# Patient Record
Sex: Male | Born: 1970 | Race: White | Hispanic: No | Marital: Married | Smoking: Never smoker
Health system: Southern US, Community
[De-identification: ages and names within clinical notes are randomized; demographics above are authoritative.]

## PROBLEM LIST (undated history)

## (undated) DIAGNOSIS — F419 Anxiety disorder, unspecified: Secondary | ICD-10-CM

## (undated) DIAGNOSIS — K219 Gastro-esophageal reflux disease without esophagitis: Secondary | ICD-10-CM

## (undated) DIAGNOSIS — J9601 Acute respiratory failure with hypoxia: Secondary | ICD-10-CM

## (undated) DIAGNOSIS — IMO0002 Reserved for concepts with insufficient information to code with codable children: Secondary | ICD-10-CM

## (undated) DIAGNOSIS — G4730 Sleep apnea, unspecified: Secondary | ICD-10-CM

## (undated) HISTORY — DX: Anxiety disorder, unspecified: F41.9

## (undated) HISTORY — PX: EYE SURGERY: SHX253

## (undated) HISTORY — PX: COLONOSCOPY: SHX174

## (undated) HISTORY — DX: Gastro-esophageal reflux disease without esophagitis: K21.9

## (undated) HISTORY — DX: Reserved for concepts with insufficient information to code with codable children: IMO0002

## (undated) HISTORY — PX: UPPER GI ENDOSCOPY: SHX6162

## (undated) HISTORY — DX: Sleep apnea, unspecified: G47.30

## (undated) HISTORY — DX: Acute respiratory failure with hypoxia: J96.01

---

## 2007-12-17 ENCOUNTER — Ambulatory Visit: Payer: Self-pay | Admitting: Internal Medicine

## 2008-03-03 ENCOUNTER — Ambulatory Visit: Payer: Self-pay | Admitting: Specialist

## 2008-03-23 ENCOUNTER — Ambulatory Visit: Payer: Self-pay | Admitting: Unknown Physician Specialty

## 2009-08-19 ENCOUNTER — Ambulatory Visit: Payer: Self-pay | Admitting: Family Medicine

## 2009-08-19 DIAGNOSIS — L255 Unspecified contact dermatitis due to plants, except food: Secondary | ICD-10-CM

## 2009-08-19 HISTORY — DX: Unspecified contact dermatitis due to plants, except food: L25.5

## 2010-03-28 NOTE — Assessment & Plan Note (Signed)
Summary: POISON OAK/EVM   Vital Signs:  Patient Profile:   40 Years Old Male CC:      Poison oak rash. Height:     67.75 inches Weight:      184 pounds BMI:     28.29 Temp:     97.6 degrees F oral Pulse rate:   80 / minute Pulse rhythm:   regular Resp:     18 per minute BP sitting:   98 / 60  (left arm)  Pt. in pain?   no  Vitals Entered By: Ma Hillock LPN                   Prior Medication List:  No prior medications documented  Current Allergies: No known allergies History of Present Illness Chief Complaint: Poison oak rash. History of Present Illness: Patient exposed to Poison Ivy yesterday. Hx of multiple exposures usuaally requiring an injection or pills when it happens. Started reacting to the exposure today.  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  Eyes       Denies change in vision, eye pain, and eye discharge. Ear/Nose/Throat/Mouth       Denies hearing loss/aids, change in hearing, and ear pain.  Respiratory       Denies dry cough, productive cough, wheezing, and shortness of breath.  Cardiovascular       Denies murmurs, chest pain, and tires easily with exhertion.    Gastrointestinal       Denies stomach pain, nausea/vomiting, and diarrhea. Genitourniary       Denies painful urination and blood or discharge from penis. Neurological       Denies headaches, loss of or changes in sensation, and numbness. Musculoskeletal       Complains of joint stiffness.      Denies muscle pain.   Psych       Denies mood changes, anxiety/stress, and speech problems.  Past History:  Family History: Family History of CAD  Male 1st degree relative <60 Family History Hypertension Family History of Stroke M 1st degree relative <50 Family History of Cardiovascular disorder  Vital Signs:  Patient profile:   40 Years Old Male Height:      67.75 inches Weight:      184 pounds BMI:     28.29 Temp:     97.6 degrees F oral Pulse rate:   80 / minute Pulse rhythm:   regular Resp:     18 per minute BP sitting:   98 / 60  CC: Poison oak rash.  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  Plan New Medications/Changes: PREDNISONE (PAK) 10 MG TABS (PREDNISONE) sig 6 pills 1st 2 days,5 pills days 3-4, 4 pills 5-6, 3 pills day 7-8, 2 pills day 9-10,1 pills 11-12  #  QS x 0, 08/19/2009, Hassan Rowan MD  New Orders: New Patient Level III [phone] Solumedrol up to 125mg  [J2930] Planning Comments:   take all of the pills to acvoid rebound  Follow Up: Follow up on an as needed basis, Follow up with Primary Physician   Diagnoses and expected course of recovery discussed and will return if not improved as expected or if the condition worsens. Patient and/or caregiver verbalized understanding.  Prescriptions: PREDNISONE (PAK) 10 MG TABS (PREDNISONE) sig 6 pills 1st 2 days,5 pills days 3-4, 4 pills 5-6, 3 pills day 7-8, 2 pills day 9-10,1 pills 11-12  #QS x 0   Entered and Authorized by:   Hassan Rowan MD   Signed by:   Hassan Rowan MD on 08/19/2009   Method used:   Print then Give to Patient   RxID:   [id number]   Patient Instructions: 1)  Please schedule an appointment with your  primary doctor in : 2)   a follow-up appointment in 2 weeks if needed. 3)  Please take all of your pills to prevent rebound.  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 given injection of Solumedrol 125mg  per MD order without complications.  Observed for 10 minutes after injection was given tolerated well.    Patient tolerated injection without complications    Given by: Ashok Norris LPN (August 19, 2009 1:35 PM)  Orders Added: 1)  New Patient Level III [99203] 2)  Solumedrol up to 125mg  [J2930]

## 2012-07-09 ENCOUNTER — Ambulatory Visit: Payer: Self-pay | Admitting: Gastroenterology

## 2012-07-12 ENCOUNTER — Ambulatory Visit: Payer: Self-pay | Admitting: Gastroenterology

## 2012-09-21 ENCOUNTER — Encounter: Payer: Self-pay | Admitting: Internal Medicine

## 2012-09-21 ENCOUNTER — Ambulatory Visit (INDEPENDENT_AMBULATORY_CARE_PROVIDER_SITE_OTHER): Payer: 59 | Admitting: Internal Medicine

## 2012-09-21 VITALS — BP 120/80 | HR 82 | Temp 98.1°F | Ht 67.5 in | Wt 195.0 lb

## 2012-09-21 DIAGNOSIS — IMO0002 Reserved for concepts with insufficient information to code with codable children: Secondary | ICD-10-CM

## 2012-09-21 DIAGNOSIS — R42 Dizziness and giddiness: Secondary | ICD-10-CM

## 2012-09-21 DIAGNOSIS — K219 Gastro-esophageal reflux disease without esophagitis: Secondary | ICD-10-CM

## 2012-09-21 DIAGNOSIS — Z125 Encounter for screening for malignant neoplasm of prostate: Secondary | ICD-10-CM

## 2012-09-21 DIAGNOSIS — R109 Unspecified abdominal pain: Secondary | ICD-10-CM

## 2012-09-21 DIAGNOSIS — Z13220 Encounter for screening for lipoid disorders: Secondary | ICD-10-CM

## 2012-09-25 ENCOUNTER — Encounter: Payer: Self-pay | Admitting: Internal Medicine

## 2012-09-25 DIAGNOSIS — M5090 Cervical disc disorder, unspecified, unspecified cervical region: Secondary | ICD-10-CM | POA: Insufficient documentation

## 2012-09-25 DIAGNOSIS — K219 Gastro-esophageal reflux disease without esophagitis: Secondary | ICD-10-CM | POA: Insufficient documentation

## 2012-09-25 DIAGNOSIS — R42 Dizziness and giddiness: Secondary | ICD-10-CM | POA: Insufficient documentation

## 2012-09-25 NOTE — Progress Notes (Signed)
  Subjective:    Patient ID: Devin Humphrey, male    DOB: 04-22-1970, 42 y.o.   MRN: [id number]  HPI 42 year old male with past history of cervical disc disease and possible GERD.  He reports that he was having persistent intermittent cramping and abdominal discomfort.  Saw GI [facility] [place]  Had an EGD, colonoscopy, abdominal ultrasound and HIDA.  States all tests turned out "fine".  Was started on PPI.  States symptoms have resolved since he has been taking it on a regular basis.  Feels back to normal.  Eating and drinking well.  Does have neck pain.  Has been evaluated.  Found to have C3-4 DDD.  Has had epidural injections.  No significant change.  Does describe intermittent neck pain and burning.  He also describes a sensation change in his head.  No actual dizziness.  Hard for him to describe.  This has been going on for a while.  Has had MRI.  Has seen ENT and neurology.  No known cause found.  Increased stress.  Better now.  Describes the sensation change as only occurring when he drives on the highway.  Does not occur when he is driving any other places.  Does not occur at other times.  No associated headache or vision change.     Past Medical History  Diagnosis Date  . GERD (gastroesophageal reflux disease)   . Degenerative disc disease     cervical disc disease    Outpatient Encounter Prescriptions as of 09/21/2012  Medication Sig Dispense Refill  . Multiple Vitamins-Minerals (MENS MULTIVITAMIN PLUS PO) Take by mouth.      Marland Kitchen omeprazole-sodium bicarbonate (ZEGERID) 40-1100 MG per capsule Take 1 capsule by mouth daily before breakfast.       No facility-administered encounter medications on file as of 09/21/2012.    Review of Systems Patient denies any headache.  No significant dizziness.  Does report the head sensation change as outlined.  No chest pain, tightness or palpitations.  No increased shortness of breath, cough or congestion.  No nausea or vomiting.  No abdominal pain or  cramping now.  On PPI.  Symptoms better now.  No bowel change, such as diarrhea, constipation, BRBPR or melana.  No urine change.        Objective:   Physical Exam Filed Vitals:   09/21/12 0946  BP: 120/80  Pulse: 82  Temp: 98.1 F (51.47 C)   42 year old male in no acute distress.  HEENT:  Nares - clear.  Oropharynx - without lesions. NECK:  Supple.  Nontender.  No audible carotid bruit.  HEART:  Appears to be regular.   LUNGS:  No crackles or wheezing audible.  Respirations even and unlabored.   RADIAL PULSE:  Equal bilaterally.  ABDOMEN:  Soft.  Nontender.  Bowel sounds present and normal.  No audible abdominal bruit.   EXTREMITIES:  No increased edema present.  DP pulses palpable and equal bilaterally.      Assessment & Plan:  HEALTH MAINTENANCE.  Obtain outside records.  Schedule him for a physical next visit.  Check routine labs including cholesterol.   I spent 45 minutes with this patient and more than 50% of the time was spent in consultation regarding the above.

## 2012-09-25 NOTE — Assessment & Plan Note (Signed)
Sensation change as outlined.  Only occurs when driving on the highway.  Discussed at length with him today.  Has had extensive w/up including MRI, ENT and neurology evaluation.  Discussed the possibility of anxiety contributing.  Discussed medications.  Will follow.  Check routine labs.   Schedule f/u soon to reassess.

## 2012-09-25 NOTE — Assessment & Plan Note (Signed)
Has had extensive GI w/up as outlined.  Since being on PPI, symptoms better.  Follow.   

## 2012-09-25 NOTE — Assessment & Plan Note (Signed)
Involves C3-4.  Has been evaluated.  Has had epidural injections.  Stable.   

## 2012-10-04 ENCOUNTER — Encounter: Payer: Self-pay | Admitting: Internal Medicine

## 2012-11-12 ENCOUNTER — Other Ambulatory Visit: Payer: Self-pay | Admitting: *Deleted

## 2012-11-12 MED ORDER — OMEPRAZOLE-SODIUM BICARBONATE 40-1100 MG PO CAPS: 1.0000 | ORAL_CAPSULE | Freq: Every day | ORAL | Status: DC

## 2012-11-12 NOTE — Telephone Encounter (Signed)
Requested RF on Zegerid #[id number] to [facility] sent electronically

## 2012-11-30 ENCOUNTER — Other Ambulatory Visit: Payer: 59

## 2012-12-03 ENCOUNTER — Encounter: Payer: 59 | Admitting: Internal Medicine

## 2013-06-13 ENCOUNTER — Encounter: Payer: Self-pay | Admitting: Adult Health

## 2013-06-13 ENCOUNTER — Ambulatory Visit (INDEPENDENT_AMBULATORY_CARE_PROVIDER_SITE_OTHER): Payer: 59 | Admitting: Adult Health

## 2013-06-13 VITALS — BP 112/62 | HR 80 | Temp 97.7°F | Resp 14 | Wt 197.0 lb

## 2013-06-13 DIAGNOSIS — J069 Acute upper respiratory infection, unspecified: Secondary | ICD-10-CM

## 2013-06-13 MED ORDER — BENZONATATE 200 MG PO CAPS: 200.0000 mg | ORAL_CAPSULE | Freq: Two times a day (BID) | ORAL | Status: DC | PRN

## 2013-06-13 NOTE — Progress Notes (Signed)
Pre visit review using our clinic review tool, if applicable. No additional management support is needed unless otherwise documented below in the visit note. 

## 2013-06-13 NOTE — Patient Instructions (Signed)
   Start Tessalon 1 capsule twice daily for cough. Continue your prescribed nasal spray You may consider taking an over the counter antihistamine such as claritin, allegra or zyrtec.  ADDITIONAL TREATMENT RECOMMENDATIONS:  1. Decongestant: Sudafed (NOT IF HIGH BLOOD PRESSURE)  2. Nose Sprays: Saline nasal spray, Can use Afrin or Neosynephrine, but no more than 3 days  3. Mucinex (expectorant) or Mucinex-D (expectorant and decongestant)  4. Breathe moist air: Humidifier, Vaporizer, or steam from shower   Call if symptoms worsen or do not improve within 4-5 days.

## 2013-06-13 NOTE — Progress Notes (Signed)
   Subjective:    Patient ID: Devin Humphrey, male    DOB: Dec 06, 1970, 43 y.o.   MRN: [id number]  HPI  Pt is a 43 y/o male who presents to clinic with c/o cough, ear fullness, headache, nasal congestion. He reports being seen in the urgent care on 05/28/13 and he was given a prednisone taper. These symptoms started back on Thursday. No fever or chills. Has taken sudafed and NyQuil.    Past Medical History  Diagnosis Date  . GERD (gastroesophageal reflux disease)   . Degenerative disc disease     cervical disc disease     No past surgical history on file.   Family History  Problem Relation Age of Onset  . Hyperlipidemia Father   . Heart disease Father     s/p CABG and stent  . Stroke Father   . Hypertension Father   . Colon cancer Neg Hx   . Prostate cancer Neg Hx      History   Social History  . Marital Status: Married    Spouse Name: N/A    Number of Children: N/A  . Years of Education: N/A   Occupational History  . Not on file.   Social History Main Topics  . Smoking status: Never Smoker   . Smokeless tobacco: Never Used  . Alcohol Use: Yes  . Drug Use: No  . Sexual Activity: Not on file   Other Topics Concern  . Not on file   Social History Narrative  . No narrative on file    Review of Systems  Constitutional: Negative for fever and chills.  HENT: Positive for congestion, postnasal drip, rhinorrhea, sinus pressure and sore throat.   Respiratory: Positive for cough. Negative for shortness of breath and wheezing.   Gastrointestinal: Negative.   Neurological: Positive for headaches.  All other systems reviewed and are negative.      Objective:   Physical Exam  Constitutional: He is oriented to person, place, and time. He appears well-developed and well-nourished. No distress.  HENT:  Head: Normocephalic and atraumatic.  Right Ear: External ear normal.  Left Ear: External ear normal.  Mouth/Throat: Oropharynx is clear and moist. No  oropharyngeal exudate.  Eyes: Conjunctivae and EOM are normal.  Neck: Normal range of motion. Neck supple.  Cardiovascular: Normal rate, regular rhythm and normal heart sounds.  Exam reveals no gallop and no friction rub.   No murmur heard. Pulmonary/Chest: Effort normal and breath sounds normal. No respiratory distress. He has no wheezes. He has no rales.  Musculoskeletal: Normal range of motion.  Lymphadenopathy:    He has no cervical adenopathy.  Neurological: He is alert and oriented to person, place, and time.  Skin: Skin is warm and dry.  Psychiatric: He has a normal mood and affect. His behavior is normal. Judgment and thought content normal.      Assessment & Plan:   1. Upper respiratory infection Start tessalon for cough. Continue otc medication: sudafed. May consider antihistamine such as allegra, claritin or zyrtec. Saline nasal spray liberally. Afrin for severe congestion x 3 days only. If symptoms persist or do not improve within 4-5 days he is to call the office.

## 2013-08-24 ENCOUNTER — Ambulatory Visit (INDEPENDENT_AMBULATORY_CARE_PROVIDER_SITE_OTHER): Payer: No Typology Code available for payment source | Admitting: Internal Medicine

## 2013-08-24 ENCOUNTER — Encounter: Payer: Self-pay | Admitting: Internal Medicine

## 2013-08-24 VITALS — BP 120/80 | HR 81 | Temp 98.1°F | Ht 68.0 in | Wt 207.0 lb

## 2013-08-24 DIAGNOSIS — K219 Gastro-esophageal reflux disease without esophagitis: Secondary | ICD-10-CM

## 2013-08-24 DIAGNOSIS — M5030 Other cervical disc degeneration, unspecified cervical region: Secondary | ICD-10-CM

## 2013-08-24 DIAGNOSIS — Z125 Encounter for screening for malignant neoplasm of prostate: Secondary | ICD-10-CM

## 2013-08-24 DIAGNOSIS — R109 Unspecified abdominal pain: Secondary | ICD-10-CM

## 2013-08-24 DIAGNOSIS — R42 Dizziness and giddiness: Secondary | ICD-10-CM

## 2013-08-24 DIAGNOSIS — Z13220 Encounter for screening for lipoid disorders: Secondary | ICD-10-CM

## 2013-08-24 DIAGNOSIS — R0602 Shortness of breath: Secondary | ICD-10-CM

## 2013-08-24 DIAGNOSIS — R079 Chest pain, unspecified: Secondary | ICD-10-CM

## 2013-08-24 LAB — LIPID PANEL
CHOL/HDL RATIO: 6
Cholesterol: 228 mg/dL — ABNORMAL HIGH (ref 0–200)
HDL: 35.1 mg/dL — AB (ref >39.00)
LDL Cholesterol: 135 mg/dL — ABNORMAL HIGH (ref 0–99)
NonHDL: 192.9
Triglycerides: 292 mg/dL — ABNORMAL HIGH (ref 0.0–149.0)
VLDL: 58.4 mg/dL — ABNORMAL HIGH (ref 0.0–40.0)

## 2013-08-24 LAB — CBC WITH DIFFERENTIAL/PLATELET
BASOS ABS: 0 10*3/uL (ref 0.0–0.1)
Basophils Relative: 0.4 % (ref 0.0–3.0)
Eosinophils Absolute: 0.1 10*3/uL (ref 0.0–0.7)
Eosinophils Relative: 1.5 % (ref 0.0–5.0)
HEMATOCRIT: 44.5 % (ref 39.0–52.0)
HEMOGLOBIN: 15.4 g/dL (ref 13.0–17.0)
LYMPHS ABS: 2.7 10*3/uL (ref 0.7–4.0)
LYMPHS PCT: 40.7 % (ref 12.0–46.0)
MCHC: 34.8 g/dL (ref 30.0–36.0)
MCV: 85.2 fl (ref 78.0–100.0)
MONOS PCT: 7.1 % (ref 3.0–12.0)
Monocytes Absolute: 0.5 10*3/uL (ref 0.1–1.0)
Neutro Abs: 3.3 10*3/uL (ref 1.4–7.7)
Neutrophils Relative %: 50.3 % (ref 43.0–77.0)
Platelets: 297 10*3/uL (ref 150.0–400.0)
RBC: 5.22 Mil/uL (ref 4.22–5.81)
RDW: 14 % (ref 11.5–15.5)
WBC: 6.6 10*3/uL (ref 4.0–10.5)

## 2013-08-24 LAB — COMPREHENSIVE METABOLIC PANEL
ALT: 23 U/L (ref 0–53)
AST: 25 U/L (ref 0–37)
Albumin: 4.2 g/dL (ref 3.5–5.2)
Alkaline Phosphatase: 72 U/L (ref 39–117)
BILIRUBIN TOTAL: 0.5 mg/dL (ref 0.2–1.2)
BUN: 16 mg/dL (ref 6–23)
CO2: 23 meq/L (ref 19–32)
CREATININE: 1 mg/dL (ref 0.4–1.5)
Calcium: 9.2 mg/dL (ref 8.4–10.5)
Chloride: 103 mEq/L (ref 96–112)
GFR: 84.31 mL/min (ref >60.00)
Glucose, Bld: 87 mg/dL (ref 70–99)
Potassium: 4.3 mEq/L (ref 3.5–5.1)
Sodium: 138 mEq/L (ref 135–145)
Total Protein: 7.1 g/dL (ref 6.0–8.3)

## 2013-08-24 LAB — TSH: TSH: 1.24 u[IU]/mL (ref 0.35–4.50)

## 2013-08-24 LAB — PSA: PSA: 0.75 ng/mL (ref 0.10–4.00)

## 2013-08-24 MED ORDER — SERTRALINE HCL 50 MG PO TABS: 50.0000 mg | ORAL_TABLET | Freq: Every day | ORAL | Status: DC

## 2013-08-24 NOTE — Progress Notes (Signed)
Pre visit review using our clinic review tool, if applicable. No additional management support is needed unless otherwise documented below in the visit note. 

## 2013-08-24 NOTE — Patient Instructions (Signed)
Take 1/2 tablet per day for one week and then increase to one whole tablet per day.

## 2013-08-25 ENCOUNTER — Encounter: Payer: Self-pay | Admitting: *Deleted

## 2013-08-27 ENCOUNTER — Encounter: Payer: Self-pay | Admitting: Internal Medicine

## 2013-08-27 DIAGNOSIS — R0602 Shortness of breath: Secondary | ICD-10-CM | POA: Insufficient documentation

## 2013-08-27 NOTE — Assessment & Plan Note (Signed)
EKG obtained and revealed SR with flattening Twaves in III.  Father had first MI in his 6240s.  Given the sob with exertion and family history, I will order stress test to confirm no active ischemia.

## 2013-08-27 NOTE — Assessment & Plan Note (Signed)
Has had extensive GI w/up as outlined.  Since being on PPI, symptoms better.  Follow.

## 2013-08-27 NOTE — Assessment & Plan Note (Signed)
Involves C3-4.  Has been evaluated.  Has had epidural injections.  Stable.

## 2013-08-27 NOTE — Progress Notes (Signed)
Subjective:    Patient ID: Devin Humphrey, male    DOB: 1970-10-23, 43 y.o.   MRN: [id number]  HPI 43 year old male with past history of cervical disc disease and possible GERD.  He reports that he was having persistent intermittent cramping and abdominal discomfort.  Saw [facility]  Had an EGD, colonoscopy, abdominal ultrasound and HIDA.  States all tests turned out "fine".  Was started on PPI.  States symptoms have resolved since he has been taking it on a regular basis.  He comes in today to follow up on these issues as well as for a complete physical exam.   Eating and drinking well.  No abdominal pain or cramping.  No nausea or vomiting.  Does have neck pain.  Has been evaluated.  Found to have C3-4 DDD.  Has had epidural injections.  No significant change.  Does describe intermittent neck pain and burning.  He also describes a sensation change in his head.  No actual dizziness.  Hard for him to describe.  This has been going on for a while.  Has had MRI.  Has seen ENT and neurology.  No known cause found.  Increased stress.  Better now.  Describes the sensation change as only occurring when he drives on the highway.  Does not occur when he is driving any other places.  Does not occur at other times.  No associated headache or vision change.  He does feel this is getting worse.  No chest pain or tightness.  Breathing overall relatively stable.  Some sob with exertion.    Past Medical History  Diagnosis Date  . GERD (gastroesophageal reflux disease)   . Degenerative disc disease     cervical disc disease    Outpatient Encounter Prescriptions as of 08/24/2013  Medication Sig  . sertraline (ZOLOFT) 50 MG tablet Take 1 tablet (50 mg total) by mouth daily.  . [DISCONTINUED] benzonatate (TESSALON) 200 MG capsule Take 1 capsule (200 mg total) by mouth 2 (two) times daily as needed for cough.  . [DISCONTINUED] fluticasone (FLONASE) 50 MCG/ACT nasal spray   . [DISCONTINUED] Multiple  Vitamins-Minerals (MENS MULTIVITAMIN PLUS PO) Take by mouth.    Review of Systems Patient denies any headache.  No significant dizziness.  Does report the head sensation change as outlined.  No chest pain, tightness or palpitations.  Some shortness of breath with exertion.  No cough or congestion.  No nausea or vomiting.  No abdominal pain or cramping now.  On PPI.  Symptoms better now.  No bowel change, such as diarrhea, constipation, BRBPR or melana.  No urine change.  Increased anxiety.  Feels needs to be on something to help control the anxiety.  No depression.        Objective:   Physical Exam  Filed Vitals:   08/24/13 0852  BP: 120/80  Pulse: 81  Temp: 98.1 F (36.7 C)   Blood pressure recheck:  118/78, pulse 6860  43 year old male in no acute distress.  HEENT:  Nares - clear.  Oropharynx - without lesions. NECK:  Supple.  Nontender.  No audible carotid bruit.  HEART:  Appears to be regular.   LUNGS:  No crackles or wheezing audible.  Respirations even and unlabored.   RADIAL PULSE:  Equal bilaterally.  ABDOMEN:  Soft.  Nontender.  Bowel sounds present and normal.  No audible abdominal bruit.  GU:  Normal descended testicles.  No palpable testicular nodules.   RECTAL:  Could  not appreciate any palpable prostate nodules.  Heme negative.   EXTREMITIES:  No increased edema present.  DP pulses palpable and equal bilaterally.         Assessment & Plan:  HEALTH MAINTENANCE.   Physical today.  Check routine labs including PSA.    I spent 25 minutes with this patient and more than 50% of the time was spent in consultation regarding the above.

## 2013-08-27 NOTE — Assessment & Plan Note (Signed)
Sensation change as outlined.  Only occurs when driving on the highway.  Discussed at length with him today.  Has had extensive w/up including MRI, ENT and neurology evaluation.  Discussed the possibility of anxiety contributing.  Discussed medications.  Will start zoloft as directed.  Check routine labs.   Schedule f/u soon to reassess.

## 2013-09-22 ENCOUNTER — Other Ambulatory Visit (INDEPENDENT_AMBULATORY_CARE_PROVIDER_SITE_OTHER): Payer: No Typology Code available for payment source

## 2013-09-22 DIAGNOSIS — R0602 Shortness of breath: Secondary | ICD-10-CM

## 2013-10-05 ENCOUNTER — Encounter: Payer: Self-pay | Admitting: Internal Medicine

## 2013-10-05 ENCOUNTER — Ambulatory Visit (INDEPENDENT_AMBULATORY_CARE_PROVIDER_SITE_OTHER): Payer: No Typology Code available for payment source | Admitting: Internal Medicine

## 2013-10-05 VITALS — BP 130/80 | HR 91 | Temp 97.9°F | Ht 68.0 in | Wt 205.5 lb

## 2013-10-05 DIAGNOSIS — K219 Gastro-esophageal reflux disease without esophagitis: Secondary | ICD-10-CM

## 2013-10-05 DIAGNOSIS — F411 Generalized anxiety disorder: Secondary | ICD-10-CM

## 2013-10-05 DIAGNOSIS — F419 Anxiety disorder, unspecified: Secondary | ICD-10-CM

## 2013-10-05 DIAGNOSIS — R0602 Shortness of breath: Secondary | ICD-10-CM

## 2013-10-05 NOTE — Progress Notes (Signed)
Pre visit review using our clinic review tool, if applicable. No additional management support is needed unless otherwise documented below in the visit note. 

## 2013-10-09 ENCOUNTER — Encounter: Payer: Self-pay | Admitting: Internal Medicine

## 2013-10-09 DIAGNOSIS — F419 Anxiety disorder, unspecified: Secondary | ICD-10-CM | POA: Insufficient documentation

## 2013-10-09 NOTE — Progress Notes (Signed)
  Subjective:    Patient ID: Devin Humphrey, male    DOB: Jul 01, 1970, 43 y.o.   MRN: [id number]  HPI 43 year old male with past history of cervical disc disease and possible GERD.  He reports that he was having persistent intermittent cramping and abdominal discomfort.  [facility]  Had an EGD, colonoscopy, abdominal ultrasound and HIDA.  States all tests turned out "fine".  Was started on PPI.  States symptoms have resolved since he has been taking it on a regular basis.  He comes in today for a scheduled follow up.   Eating and drinking well.  No abdominal pain or cramping.  No nausea or vomiting.  Does have neck pain.  Has been evaluated.  Found to have C3-4 DDD.  Has had epidural injections.  No significant change.  Does describe intermittent neck pain and burning.  He also describes a sensation change in his head.  No actual dizziness.  This has been going on for a while.  Has had MRI.  Has seen ENT and neurology.  No known cause found. Reported increased stress previously.  See last note for details.  Was started on zoloft.  Has helped.  States he does not feel as stressed and does not worry as much.  He feels he is more fatigued since starting.  No chest pain or tightness.  No sob.  Eating and drinking well.     Past Medical History  Diagnosis Date  . GERD (gastroesophageal reflux disease)   . Degenerative disc disease     cervical disc disease    Outpatient Encounter Prescriptions as of 10/05/2013  Medication Sig  . sertraline (ZOLOFT) 50 MG tablet Take 1 tablet (50 mg total) by mouth daily.    Review of Systems Patient denies any headache.  No significant dizziness.  No  Sinus or allergy symptoms.  No chest pain, tightness or palpitations.  No sob.  No cough or congestion.  No nausea or vomiting.  No abdominal pain or cramping now.  On PPI.  Symptoms better now.  No bowel change, such as diarrhea, constipation, BRBPR or melana.  No urine change.  Anxiety better on zoloft.   Some fatigue that started when he started on zoloft.  No depression.        Objective:   Physical Exam  Filed Vitals:   10/05/13 1237  BP: 130/80  Pulse: 91  Temp: 97.9 F (36.6 C)   Blood pressure recheck:  45110/6178  43 year old male in no acute distress.  HEENT:  Nares - clear.  Oropharynx - without lesions. NECK:  Supple.  Nontender.  No audible carotid bruit.  HEART:  Appears to be regular.   LUNGS:  No crackles or wheezing audible.  Respirations even and unlabored.   RADIAL PULSE:  Equal bilaterally.  ABDOMEN:  Soft.  Nontender.  Bowel sounds present and normal.  No audible abdominal bruit.    EXTREMITIES:  No increased edema present.  DP pulses palpable and equal bilaterally.         Assessment & Plan:  HEALTH MAINTENANCE.   Physical 08/24/13.  PSA .75 08/24/13.

## 2013-10-09 NOTE — Assessment & Plan Note (Signed)
Recent stress test negative for ischemia.  Breathing stable.  Follow.

## 2013-10-09 NOTE — Assessment & Plan Note (Signed)
Better since starting zoloft.  Some fatigue since starting.  Since zoloft working well, will decrease dose to 25mg  q day and see if he tolerates better.  Follow.

## 2013-10-09 NOTE — Assessment & Plan Note (Signed)
Has had extensive GI w/up as outlined.  Since being on PPI, symptoms better.  Follow.   

## 2013-10-13 ENCOUNTER — Other Ambulatory Visit: Payer: Self-pay | Admitting: *Deleted

## 2013-10-13 MED ORDER — SERTRALINE HCL 50 MG PO TABS: 50.0000 mg | ORAL_TABLET | Freq: Every day | ORAL | Status: DC

## 2013-10-27 ENCOUNTER — Other Ambulatory Visit: Payer: Self-pay | Admitting: Internal Medicine

## 2013-12-15 ENCOUNTER — Encounter: Payer: Self-pay | Admitting: Internal Medicine

## 2013-12-15 ENCOUNTER — Encounter (INDEPENDENT_AMBULATORY_CARE_PROVIDER_SITE_OTHER): Payer: Self-pay

## 2013-12-15 ENCOUNTER — Ambulatory Visit (INDEPENDENT_AMBULATORY_CARE_PROVIDER_SITE_OTHER): Payer: No Typology Code available for payment source | Admitting: Internal Medicine

## 2013-12-15 VITALS — BP 120/80 | HR 97 | Temp 98.2°F | Ht 68.0 in | Wt 205.2 lb

## 2013-12-15 DIAGNOSIS — E7800 Pure hypercholesterolemia, unspecified: Secondary | ICD-10-CM

## 2013-12-15 DIAGNOSIS — M5090 Cervical disc disorder, unspecified, unspecified cervical region: Secondary | ICD-10-CM

## 2013-12-15 DIAGNOSIS — F419 Anxiety disorder, unspecified: Secondary | ICD-10-CM

## 2013-12-15 NOTE — Progress Notes (Signed)
Pre visit review using our clinic review tool, if applicable. No additional management support is needed unless otherwise documented below in the visit note. 

## 2013-12-19 ENCOUNTER — Encounter: Payer: Self-pay | Admitting: Internal Medicine

## 2013-12-19 DIAGNOSIS — E7800 Pure hypercholesterolemia, unspecified: Secondary | ICD-10-CM | POA: Insufficient documentation

## 2013-12-19 NOTE — Assessment & Plan Note (Signed)
Involves C3-4.  Has been evaluated.  Has had epidural injections.  Stable.   

## 2013-12-19 NOTE — Assessment & Plan Note (Signed)
On zoloft and doing better.  Still some issues with driving as outlined.  Refer to counselor for further evaluation and treatment.

## 2013-12-19 NOTE — Assessment & Plan Note (Signed)
Low cholesterol diet and exercise.  Follow.   

## 2013-12-19 NOTE — Progress Notes (Signed)
Subjective:    Patient ID: Devin Humphrey, male    DOB: Nov 24, 1970, 43 y.o.   MRN: [id number]  HPI 43 year old male with past history of cervical disc disease and possible GERD.  He reports that he was having persistent intermittent cramping and abdominal discomfort.  Saw GI [facility]  Had an EGD, colonoscopy, abdominal ultrasound and HIDA.  States all tests turned out "fine".  Was started on PPI.  States symptoms have resolved since he has been taking it on a regular basis.  He comes in today for a scheduled follow up.   Eating and drinking well.  No abdominal pain or cramping.  No nausea or vomiting.  Does have neck pain.  Has been evaluated.  Found to have C3-4 DDD.  Has had epidural injections.  No significant change.  Does describe intermittent neck pain and burning.  He also describes a sensation change in his head.  No actual dizziness.  This has been going on for a while.  Has had MRI.  Has seen ENT and neurology.  No known cause found.  Reported increased stress previously.  See previous note for details.  Was started on zoloft.  Has helped.  We had to adjust the dose down last visit.  He is taking 1/2 50mg  now and this appears to be working. States he does not feel as stressed and does not worry as much.   No chest pain or tightness.  No sob.  Eating and drinking well. Some fatigue.  He still has issues [place].  We discussed this more today.  Discussed referral for counseling.  He is in agreement.      Past Medical History  Diagnosis Date  . GERD (gastroesophageal reflux disease)   . Degenerative disc disease     cervical disc disease    Outpatient Encounter Prescriptions as of 12/15/2013  Medication Sig  . sertraline (ZOLOFT) 50 MG tablet TAKE 1 TABLET BY MOUTH EVERY DAY  . [DISCONTINUED] sertraline (ZOLOFT) 50 MG tablet Take 1 tablet (50 mg total) by mouth daily.    Review of Systems Patient denies any headache.  No significant dizziness.  No   sinus or allergy symptoms.  No chest pain, tightness or palpitations.  No sob.  No cough or congestion.  No nausea or vomiting.  No abdominal pain or cramping now.  On PPI.  Symptoms better now.  No bowel change, such as diarrhea, constipation, BRBPR or melana.  No urine change.  Anxiety better on zoloft.  No depression.  Some fatigue.  Still has the issues with driving as outlined.         Objective:   Physical Exam  Filed Vitals:   12/15/13 1324  BP: 120/80  Pulse: 97  Temp: 98.2 F (4636.518 C)   43 year old male in no acute distress.  HEENT:  Nares - clear.  Oropharynx - without lesions. NECK:  Supple.  Nontender.  No audible carotid bruit.  HEART:  Appears to be regular.   LUNGS:  No crackles or wheezing audible.  Respirations even and unlabored.   RADIAL PULSE:  Equal bilaterally.  ABDOMEN:  Soft.  Nontender.  Bowel sounds present and normal.  No audible abdominal bruit.    EXTREMITIES:  No increased edema present.  DP pulses palpable and equal bilaterally.         Assessment & Plan:  HEALTH MAINTENANCE.   Physical 08/24/13.  PSA .75 08/24/13.    Problem  List Items Addressed This Visit   Anxiety - Primary     On zoloft and doing better.  Still some issues with driving as outlined.  Refer to counselor for further evaluation and treatment.       Cervical disc disease     Involves C3-4.  Has been evaluated.  Has had epidural injections.  Stable.

## 2014-01-17 ENCOUNTER — Encounter: Payer: Self-pay | Admitting: *Deleted

## 2014-01-17 ENCOUNTER — Other Ambulatory Visit (INDEPENDENT_AMBULATORY_CARE_PROVIDER_SITE_OTHER): Payer: No Typology Code available for payment source

## 2014-01-17 DIAGNOSIS — E7800 Pure hypercholesterolemia, unspecified: Secondary | ICD-10-CM

## 2014-01-17 LAB — COMPREHENSIVE METABOLIC PANEL
ALK PHOS: 83 U/L (ref 39–117)
ALT: 18 U/L (ref 0–53)
AST: 20 U/L (ref 0–37)
Albumin: 4.4 g/dL (ref 3.5–5.2)
BILIRUBIN TOTAL: 0.5 mg/dL (ref 0.2–1.2)
BUN: 19 mg/dL (ref 6–23)
CO2: 27 mEq/L (ref 19–32)
Calcium: 9.3 mg/dL (ref 8.4–10.5)
Chloride: 103 mEq/L (ref 96–112)
Creatinine, Ser: 1.1 mg/dL (ref 0.4–1.5)
GFR: 80.52 mL/min (ref >60.00)
Glucose, Bld: 88 mg/dL (ref 70–99)
Potassium: 4.5 mEq/L (ref 3.5–5.1)
Sodium: 138 mEq/L (ref 135–145)
Total Protein: 7.7 g/dL (ref 6.0–8.3)

## 2014-01-17 LAB — LIPID PANEL
Cholesterol: 191 mg/dL (ref 0–200)
HDL: 27.9 mg/dL — ABNORMAL LOW (ref >39.00)
LDL CALC: 134 mg/dL — AB (ref 0–99)
NONHDL: 163.1
Total CHOL/HDL Ratio: 7
Triglycerides: 146 mg/dL (ref 0.0–149.0)
VLDL: 29.2 mg/dL (ref 0.0–40.0)

## 2014-02-27 ENCOUNTER — Ambulatory Visit (INDEPENDENT_AMBULATORY_CARE_PROVIDER_SITE_OTHER): Payer: 59 | Admitting: General Surgery

## 2014-02-27 ENCOUNTER — Encounter: Payer: Self-pay | Admitting: General Surgery

## 2014-02-27 VITALS — BP 110/76 | HR 74 | Resp 12 | Ht 69.0 in | Wt 203.0 lb

## 2014-02-27 DIAGNOSIS — R109 Unspecified abdominal pain: Secondary | ICD-10-CM | POA: Insufficient documentation

## 2014-02-27 DIAGNOSIS — R079 Chest pain, unspecified: Secondary | ICD-10-CM

## 2014-02-27 NOTE — Progress Notes (Addendum)
Patient ID: Devin Humphrey, male   DOB: [birth year], 44 y.o.   MRN: [id number]  Chief Complaint  Patient presents with  . Other    evaluation of hernia    HPI Devin Humphrey is a 44 y.o. male who presents for an evaluation of an abdominal hernia. He states he has had abdominal pain for 4-5 years but has noticed it more so in the last month or two. The pain is located in the upper central portion of his abdomen just beneath his sternum. He has difficulty when eating. He states he feels like his food gets does not pass easily. He denies food "sticking". When eating he describes it as a pressure sensation.  Separate from this, he reports pain when lifting and bending. it does not need to be a heavy weight that he is lifting, as little as 20 or 25 pounds can precipitate symptoms.  He describes the pain when lifting as a burning and stabbing pain that brings him to his knees. The sharp stabbing pain comes and goes and the pain that is associated with eating is a constant sensation. He has been treated for acid reflux  with a PPI, but this didn't imhis symptoms and he discontinued the medication.  The patient denies any weight loss during this time or change in exercise tolerance. He does reported present he weighs more than that any time in the past.  He denies any component of back pain with the above-mentioned discomfort with swallowing and while lifting.  The patient was accompanied today by his wife, Misty Stanley, who was present for the interview and exam.  He was evaluated in 2014 by the GI service. That assessment included an abdominal ultrasound followed by a HIDA scan in May 2014. Both studies were normal.  He underwent upper and lower endoscopy in June 2014. Faxed copies of the report were reviewed. The colon was entirely normal. A single polyp was reported in the gastric body. The results of that pathology is not available. Of note there was no evidence of esophagitis recorded.  HPI  Past  Medical History  Diagnosis Date  . GERD (gastroesophageal reflux disease)   . Degenerative disc disease     cervical disc disease    Past Surgical History  Procedure Laterality Date  . Colonoscopy    . Upper gi endoscopy      Family History  Problem Relation Age of Onset  . Hyperlipidemia Father   . Heart disease Father     s/p CABG and stent  . Stroke Father   . Hypertension Father   . Colon cancer Neg Hx   . Prostate cancer Neg Hx     Social History History  Substance Use Topics  . Smoking status: Never Smoker   . Smokeless tobacco: Never Used  . Alcohol Use: Yes    No Known Allergies  Current Outpatient Prescriptions  Medication Sig Dispense Refill  . sertraline (ZOLOFT) 50 MG tablet TAKE 1 TABLET BY MOUTH EVERY DAY (Patient taking differently: take 1/2 tablet by mouth daily) 30 tablet 1   No current facility-administered medications for this visit.    Review of Systems Review of Systems  Constitutional: Negative.   Respiratory: Negative.   Cardiovascular: Negative.   Gastrointestinal: Positive for abdominal pain.    Blood pressure 110/76, pulse 74, resp. rate 12, height  (1.753 m), weight 203 lb (92.08 kg).  Physical Exam Physical Exam  Constitutional: He is oriented to person, place, and time. He  appears well-developed and well-nourished.  Cardiovascular: Normal rate, regular rhythm and normal heart sounds.   No murmur heard. Pulmonary/Chest: Effort normal and breath sounds normal.  Abdominal: Soft. Normal appearance and bowel sounds are normal. There is no hepatosplenomegaly. There is no tenderness. No hernia.    Lymphadenopathy:       Right: No inguinal adenopathy present.       Left: No inguinal adenopathy present.  Neurological: He is alert and oriented to person, place, and time.  Skin: Skin is warm and dry.    Data   The abdominal ultrasound and HIDA scan from May 2014 were reviewed. The faxed images of the endoscopy reports from  June 2014 were reviewed.   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 No evidence of H. pylori infection.   Assessment    Atypical chest/abdominal pain.  Diastases recti.    Plan    At this point were at a decision tree: 1 no further testing versus 2 CT scan to look for a cold pathologic processes. The possibility of malignancy is small considering his long duration of symptoms and excellent physical abilities.  A CT scan of the chest and abdomen with contrast was recommended to the patient due to his retrosternal and post prandial pain.  This test would assess for intrathoracic processes that might not be detected on ultrasound or endoscopy such as duplication cyst. The likelihood of finding occult pathology is thought to be low.   Order has been faxed to [facility] and received per [facility].   After speaking with the scheduling staff today, patient states he wishes to put CT scan on hold for now.  Patient will notify the office if he wishes to proceed.   A release to obtain copy of 2014 upper and lower endoscopy report from [facility] has been signed by the patient.     PCP:  Letta Median 02/27/2014, 2:21 PM

## 2014-02-27 NOTE — Patient Instructions (Signed)
Patient will notify the office if he wishes to proceed.

## 2014-03-20 ENCOUNTER — Ambulatory Visit: Payer: No Typology Code available for payment source | Admitting: Internal Medicine

## 2014-05-01 ENCOUNTER — Ambulatory Visit: Payer: No Typology Code available for payment source | Admitting: Internal Medicine

## 2014-07-14 ENCOUNTER — Other Ambulatory Visit: Payer: Self-pay | Admitting: Internal Medicine

## 2014-07-14 NOTE — Telephone Encounter (Signed)
Refilled zoloft #30 with 2 refills.   

## 2014-07-14 NOTE — Telephone Encounter (Signed)
Last OV 10.22.15, last refill 4.14.16.  Please advise refill

## 2014-10-19 ENCOUNTER — Encounter (INDEPENDENT_AMBULATORY_CARE_PROVIDER_SITE_OTHER): Payer: Self-pay

## 2014-10-19 ENCOUNTER — Ambulatory Visit (INDEPENDENT_AMBULATORY_CARE_PROVIDER_SITE_OTHER): Payer: 59 | Admitting: Family Medicine

## 2014-10-19 ENCOUNTER — Encounter: Payer: Self-pay | Admitting: Family Medicine

## 2014-10-19 VITALS — BP 118/84 | HR 82 | Temp 98.7°F | Ht 69.0 in | Wt 209.0 lb

## 2014-10-19 DIAGNOSIS — M79641 Pain in right hand: Secondary | ICD-10-CM

## 2014-10-19 DIAGNOSIS — J0100 Acute maxillary sinusitis, unspecified: Secondary | ICD-10-CM

## 2014-10-19 DIAGNOSIS — J329 Chronic sinusitis, unspecified: Secondary | ICD-10-CM | POA: Insufficient documentation

## 2014-10-19 MED ORDER — AMOXICILLIN-POT CLAVULANATE 875-125 MG PO TABS: 1.0000 | ORAL_TABLET | Freq: Two times a day (BID) | ORAL | Status: DC

## 2014-10-19 MED ORDER — BENZONATATE 100 MG PO CAPS: 100.0000 mg | ORAL_CAPSULE | Freq: Three times a day (TID) | ORAL | Status: DC | PRN

## 2014-10-19 NOTE — Assessment & Plan Note (Signed)
Normal exam today. No injury. Could be related to cervical DDD. Neurologically intact today. No red flags. Offered XR hand to further evaluate, though patient declined. Patient will follow up as already scheduled with [place] [facility] for furher management of his neck. Given return precautions.

## 2014-10-19 NOTE — Patient Instructions (Signed)
Nice to meet you. You likely have a sinus infection. We will treat this with augmentin for 7 days. Please use the tessalon for cough. Please keep your appointment with ortho for your hand pain.  If there is numbness, weakness, worsening pain, redness, chest pain, shortness of breath, fever, blood in sputum, or chills please seek medical attention.

## 2014-10-19 NOTE — Assessment & Plan Note (Signed)
Patient with persistent sinus pressure and cough likely related to sinus infection. Vitals stable. Given duration of symptoms will treat with augmentin for 7 days. Tessalon for cough. Given return precautions.

## 2014-10-19 NOTE — Progress Notes (Signed)
Pre visit review using our clinic review tool, if applicable. No additional management support is needed unless otherwise documented below in the visit note. 

## 2014-10-19 NOTE — Progress Notes (Signed)
Patient ID: BERTIE SIMIEN, male   DOB: 1970/12/28, 44 y.o.   MRN: [id number]  Marikay Alar, MD Phone: [phone]  ASHBY MOSKAL is a 44 y.o. male who presents today for same day appointment.   Sinus congestion: patient reports 2 weeks of sinus pressure and congestion. Notes this continues to bother him and has not improved. He reports ear fullness and blowing yellow mucus out of nose. Some sore throat with this. Has non-productive cough. No fever. No shortness of breath. Reports he is not feeling as peppy as usual. States he took 4 days of amoxacillin BID that his wife had at home and this did not help.  Right hand pain: patient reports 2-3 weeks of pain in the center of his right hand. He notes no injury. He denies swelling and color change. He denies weakness and numbness. He notes mild tingling. He notes the pain occurred when someone shook his hand the other day, though has had at other times. Reports he has cervical DDD and has an appointment for evaluation with [place] [facility]. Notes neck pain is stable. No pain elsewhere in his body. Denies fever, saddle anesthesia, incontinence, or history of cancer.  PMH: nonsmoker.  History of DDD C3-4  ROS see HPI  Objective  Physical Exam Filed Vitals:   10/19/14 1350  BP: 118/84  Pulse: 82  Temp: 98.7 F (37.1 C)    BP Readings from Last 3 Encounters:  10/19/14 118/84  02/27/14 110/76  12/15/13 120/80   Wt Readings from Last 3 Encounters:  10/19/14 209 lb (94.802 kg)  02/27/14 203 lb (92.08 kg)  12/15/13 205 lb 4 oz (93.101 kg)    Physical Exam  Constitutional: He is well-developed, well-nourished, and in no distress.  HENT:  Head: Normocephalic and atraumatic.  Right Ear: External ear normal.  Left Ear: External ear normal.  Mouth/Throat: Oropharynx is clear and moist. No oropharyngeal exudate.  Normal TM bilaterally  Eyes: Conjunctivae are normal. Pupils are equal, round, and reactive to light.  Neck: Normal range  of motion. Neck supple.  Cardiovascular: Normal rate and regular rhythm.  Exam reveals no gallop and no friction rub.   No murmur heard. Pulmonary/Chest: Effort normal. No respiratory distress. He has no wheezes. He has no rales.  Musculoskeletal:  No midline spine tenderness, no step off, no muscular neck tenderness, no masses palpated, right hand with no swelling, erythema, or tenderness, no joint swelling, good cap refill  Lymphadenopathy:    He has no cervical adenopathy.  Neurological:  5/5 strength in bilateral biceps, triceps, grip, quads, hamstrings, plantar and dorsiflexion, sensation to light touch intact in bilateral UE and LE, normal gait, 2+ patellar reflexes  Skin: He is not diaphoretic.  negative spurlings.   Assessment/Plan: Please see individual problem list.  Right hand pain Normal exam today. No injury. Could be related to cervical DDD. Neurologically intact today. No red flags. Offered XR hand to further evaluate, though patient declined. Patient will follow up as already scheduled with [place] [facility] for furher management of his neck. Given return precautions.   Sinusitis Patient with persistent sinus pressure and cough likely related to sinus infection. Vitals stable. Given duration of symptoms will treat with augmentin for 7 days. Tessalon for cough. Given return precautions.      Meds ordered this encounter  Medications  . amoxicillin-clavulanate (AUGMENTIN) 875-125 MG per tablet    Sig: Take 1 tablet by mouth 2 (two) times daily.    Dispense:  14 tablet  Refill:  0  . benzonatate (TESSALON) 100 MG capsule    Sig: Take 1 capsule (100 mg total) by mouth 3 (three) times daily as needed for cough.    Dispense:  20 capsule    Refill:  0    Marikay Alar

## 2014-11-02 ENCOUNTER — Encounter (HOSPITAL_COMMUNITY): Payer: Self-pay | Admitting: Emergency Medicine

## 2014-11-02 ENCOUNTER — Emergency Department (HOSPITAL_COMMUNITY)
Admission: EM | Admit: 2014-11-02 | Discharge: 2014-11-02 | Disposition: A | Discharge status: Home / Self Care | Payer: 59 | Attending: Emergency Medicine | Admitting: Emergency Medicine

## 2014-11-02 ENCOUNTER — Emergency Department (HOSPITAL_COMMUNITY): Payer: 59

## 2014-11-02 DIAGNOSIS — Z8739 Personal history of other diseases of the musculoskeletal system and connective tissue: Secondary | ICD-10-CM | POA: Diagnosis not present

## 2014-11-02 DIAGNOSIS — Z8719 Personal history of other diseases of the digestive system: Secondary | ICD-10-CM | POA: Insufficient documentation

## 2014-11-02 DIAGNOSIS — R42 Dizziness and giddiness: Secondary | ICD-10-CM | POA: Diagnosis present

## 2014-11-02 DIAGNOSIS — Z792 Long term (current) use of antibiotics: Secondary | ICD-10-CM | POA: Diagnosis not present

## 2014-11-02 DIAGNOSIS — H8110 Benign paroxysmal vertigo, unspecified ear: Secondary | ICD-10-CM | POA: Insufficient documentation

## 2014-11-02 LAB — URINALYSIS, ROUTINE W REFLEX MICROSCOPIC
BILIRUBIN URINE: NEGATIVE
GLUCOSE, UA: NEGATIVE mg/dL
Hgb urine dipstick: NEGATIVE
KETONES UR: NEGATIVE mg/dL
LEUKOCYTES UA: NEGATIVE
NITRITE: NEGATIVE
PROTEIN: NEGATIVE mg/dL
Specific Gravity, Urine: 1.018 (ref 1.005–1.030)
Urobilinogen, UA: 0.2 mg/dL (ref 0.0–1.0)
pH: 8.5 — ABNORMAL HIGH (ref 5.0–8.0)

## 2014-11-02 LAB — CBC
HCT: 45.8 % (ref 39.0–52.0)
HEMOGLOBIN: 15.7 g/dL (ref 13.0–17.0)
MCH: 29.4 pg (ref 26.0–34.0)
MCHC: 34.3 g/dL (ref 30.0–36.0)
MCV: 85.8 fL (ref 78.0–100.0)
PLATELETS: 222 10*3/uL (ref 150–400)
RBC: 5.34 MIL/uL (ref 4.22–5.81)
RDW: 13 % (ref 11.5–15.5)
WBC: 7.7 10*3/uL (ref 4.0–10.5)

## 2014-11-02 LAB — COMPREHENSIVE METABOLIC PANEL
ALK PHOS: 82 U/L (ref 38–126)
ALT: 27 U/L (ref 17–63)
ANION GAP: 13 (ref 5–15)
AST: 31 U/L (ref 15–41)
Albumin: 4.3 g/dL (ref 3.5–5.0)
BILIRUBIN TOTAL: 0.5 mg/dL (ref 0.3–1.2)
BUN: 20 mg/dL (ref 6–20)
CALCIUM: 9.2 mg/dL (ref 8.9–10.3)
CO2: 25 mmol/L (ref 22–32)
Chloride: 100 mmol/L — ABNORMAL LOW (ref 101–111)
Creatinine, Ser: 1.05 mg/dL (ref 0.61–1.24)
Glucose, Bld: 113 mg/dL — ABNORMAL HIGH (ref 65–99)
Potassium: 4 mmol/L (ref 3.5–5.1)
Sodium: 138 mmol/L (ref 135–145)
TOTAL PROTEIN: 7.2 g/dL (ref 6.5–8.1)

## 2014-11-02 LAB — LIPASE, BLOOD: LIPASE: 21 U/L — AB (ref 22–51)

## 2014-11-02 MED ORDER — MECLIZINE HCL 25 MG PO TABS
50.0000 mg | ORAL_TABLET | Freq: Once | ORAL | Status: AC
  Administered 2014-11-02: 50 mg via ORAL
  Filled 2014-11-02: qty 2

## 2014-11-02 MED ORDER — DIAZEPAM 5 MG PO TABS: 5.0000 mg | ORAL_TABLET | Freq: Once | ORAL | Status: DC

## 2014-11-02 MED ORDER — MECLIZINE HCL 50 MG PO TABS: 50.0000 mg | ORAL_TABLET | Freq: Two times a day (BID) | ORAL | Status: DC | PRN

## 2014-11-02 MED ORDER — ONDANSETRON HCL 4 MG/2ML IJ SOLN
4.0000 mg | Freq: Once | INTRAMUSCULAR | Status: AC | PRN
  Administered 2014-11-02: 4 mg via INTRAVENOUS
  Filled 2014-11-02: qty 2

## 2014-11-02 NOTE — ED Notes (Signed)
Patient left at this time with all belongings. 

## 2014-11-02 NOTE — Discharge Instructions (Signed)
Benign Positional Vertigo Devin Humphrey, your MRI today does not show any stroke. You likely have vertigo, read about the Epley maneuver below to help treat her symptoms at home. MRI results of your neck are below as well. If this does not work take meclizine. See neurology within 3 days for close follow-up. If any symptoms worsen come back to emergency department immediately. Thank you.   IMPRESSION: MRI HEAD: Negative noncontrast MRI of the head. Stable appearance from March 23, 2008.  MRA HEAD: Straightened cervical lordosis without acute fracture or malalignment.  Degenerative changes resulting in mild canal stenosis C3-4 through C6-7.  Neural foraminal narrowing C3-4 thru C6-7: Moderate to severe on the RIGHT at C3-4 and C4-5.    Vertigo means you feel like you or your surroundings are moving when they are not. Benign positional vertigo is the most common form of vertigo. Benign means that the cause of your condition is not serious. Benign positional vertigo is more common in older adults. CAUSES  Benign positional vertigo is the result of an upset in the labyrinth system. This is an area in the middle ear that helps control your balance. This may be caused by a viral infection, head injury, or repetitive motion. However, often no specific cause is found. SYMPTOMS  Symptoms of benign positional vertigo occur when you move your head or eyes in different directions. Some of the symptoms may include: 1. Loss of balance and falls. 2. Vomiting. 3. Blurred vision. 4. Dizziness. 5. Nausea. 6. Involuntary eye movements (nystagmus). DIAGNOSIS  Benign positional vertigo is usually diagnosed by physical exam. If the specific cause of your benign positional vertigo is unknown, your caregiver may perform imaging tests, such as magnetic resonance imaging (MRI) or computed tomography (CT). TREATMENT  Your caregiver may recommend movements or procedures to correct the benign positional  vertigo. Medicines such as meclizine, benzodiazepines, and medicines for nausea may be used to treat your symptoms. In rare cases, if your symptoms are caused by certain conditions that affect the inner ear, you may need surgery. HOME CARE INSTRUCTIONS   Follow your caregiver's instructions.  Move slowly. Do not make sudden body or head movements.  Avoid driving.  Avoid operating heavy machinery.  Avoid performing any tasks that would be dangerous to you or others during a vertigo episode.  Drink enough fluids to keep your urine clear or pale yellow. SEEK IMMEDIATE MEDICAL CARE IF:   You develop problems with walking, weakness, numbness, or using your arms, hands, or legs.  You have difficulty speaking.  You develop severe headaches.  Your nausea or vomiting continues or gets worse.  You develop visual changes.  Your family or friends notice any behavioral changes.  Your condition gets worse.  You have a fever.  You develop a stiff neck or sensitivity to light. MAKE SURE YOU:   Understand these instructions.  Will watch your condition.  Will get help right away if you are not doing well or get worse. Document Released: 11/18/2005 Document Revised: 05/05/2011 Document Reviewed: 10/31/2010 [facility] Patient Information 2015 [place], [place]. This information is not intended to replace advice given to you by your health care provider. Make sure you discuss any questions you have with your health care provider.  Epley Maneuver Self-Care WHAT IS THE EPLEY MANEUVER? The Epley maneuver is an exercise you can do to relieve symptoms of benign paroxysmal positional vertigo (BPPV). This condition is often just referred to as vertigo. BPPV is caused by the movement of tiny crystals (  canaliths) inside your inner ear. The accumulation and movement of canaliths in your inner ear causes a sudden spinning sensation (vertigo) when you move your head to certain positions. Vertigo usually  lasts about 30 seconds. BPPV usually occurs in just one ear. If you get vertigo when you lie on your left side, you probably have BPPV in your left ear. Your health care provider can tell you which ear is involved.  BPPV may be caused by a head injury. Many people older than 50 get BPPV for unknown reasons. If you have been diagnosed with BPPV, your health care provider may teach you how to do this maneuver. BPPV is not life threatening (benign) and usually goes away in time.  WHEN SHOULD I PERFORM THE EPLEY MANEUVER? You can do this maneuver at home whenever you have symptoms of vertigo. You may do the Epley maneuver up to 3 times a day until your symptoms of vertigo go away. HOW SHOULD I DO THE EPLEY MANEUVER? 7. Sit on the edge of a bed or table with your back straight. Your legs should be extended or hanging over the edge of the bed or table.  8. Turn your head halfway toward the affected ear.  9. Lie backward quickly with your head turned until you are lying flat on your back. You may want to position a pillow under your shoulders.  10. Hold this position for 30 seconds. You may experience an attack of vertigo. This is normal. Hold this position until the vertigo stops. 11. Then turn your head to the opposite direction until your unaffected ear is facing the floor.  12. Hold this position for 30 seconds. You may experience an attack of vertigo. This is normal. Hold this position until the vertigo stops. 13. Now turn your whole body to the same side as your head. Hold for another 30 seconds.  14. You can then sit back up. ARE THERE RISKS TO THIS MANEUVER? In some cases, you may have other symptoms (such as changes in your vision, weakness, or numbness). If you have these symptoms, stop doing the maneuver and call your health care provider. Even if doing these maneuvers relieves your vertigo, you may still have dizziness. Dizziness is the sensation of light-headedness but without the sensation  of movement. Even though the Epley maneuver may relieve your vertigo, it is possible that your symptoms will return within 5 years. WHAT SHOULD I DO AFTER THIS MANEUVER? After doing the Epley maneuver, you can return to your normal activities. Ask your doctor if there is anything you should do at home to prevent vertigo. This may include:  Sleeping with two or more pillows to keep your head elevated.  Not sleeping on the side of your affected ear.  Getting up slowly from bed.  Avoiding sudden movements during the day.  Avoiding extreme head movement, like looking up or bending over.  Wearing a cervical collar to prevent sudden head movements. WHAT SHOULD I DO IF MY SYMPTOMS GET WORSE? Call your health care provider if your vertigo gets worse. Call your provider right way if you have other symptoms, including:   Nausea.  Vomiting.  Headache.  Weakness.  Numbness.  Vision changes. Document Released: 02/15/2013 Document Reviewed: 02/15/2013 [facility] Patient Information 2015 [place], [place]. This information is not intended to replace advice given to you by your health care provider. Make sure you discuss any questions you have with your health care provider.

## 2014-11-02 NOTE — ED Notes (Signed)
Pt arrives with dizziness, chills, tremors since 0100 this morning, vomiting x3 or more.

## 2014-11-02 NOTE — ED Provider Notes (Signed)
CSN: [id number]     Arrival date & time 11/02/14  0351 History   First MD Initiated Contact with Patient 11/02/14 [phone]     Chief Complaint  Patient presents with  . Dizziness  . Emesis     (Consider location/radiation/quality/duration/timing/severity/associated sxs/prior Treatment) HPI  Devin Humphrey is a 44 y.o. male with no second past medical history presenting today with dizziness. This occurred sudden onset at 1 AM. He states he was laying in bed when he became dizzy, and had multiple left is a nausea vomiting. Per his wife, he appeared to struggle to get words out. He denies any neurological symptoms at that time. He's had no abdominal pain or changes in his bowel or bladder. He denies this ever occurring to him in the past, he denies history of stroke or vertigo. Patient states his dizziness gets worse when he turns his head or lays down. He is currently trying to stay still in the room. He has no further complaints.  10 Systems reviewed and are negative for acute change except as noted in the HPI.     Past Medical History  Diagnosis Date  . GERD (gastroesophageal reflux disease)   . Degenerative disc disease     cervical disc disease   Past Surgical History  Procedure Laterality Date  . Colonoscopy    . Upper gi endoscopy     Family History  Problem Relation Age of Onset  . Hyperlipidemia Father   . Heart disease Father     s/p CABG and stent  . Stroke Father   . Hypertension Father   . Colon cancer Neg Hx   . Prostate cancer Neg Hx    Social History  Substance Use Topics  . Smoking status: Never Smoker   . Smokeless tobacco: Never Used  . Alcohol Use: Yes    Review of Systems    Allergies  Review of patient's allergies indicates no known allergies.  Home Medications   Prior to Admission medications   Medication Sig Start Date End Date Taking? Authorizing Provider  amoxicillin-clavulanate (AUGMENTIN) 875-125 MG per tablet Take 1 tablet by mouth 2  (two) times daily. 10/19/14   Glori Luis, MD  benzonatate (TESSALON) 100 MG capsule Take 1 capsule (100 mg total) by mouth 3 (three) times daily as needed for cough. 10/19/14   Glori Luis, MD  sertraline (ZOLOFT) 50 MG tablet TAKE 1 TABLET BY MOUTH EVERY DAY Patient not taking: Reported on 10/19/2014 10/27/13   Dale [place], MD  sertraline (ZOLOFT) 50 MG tablet TAKE 1 TABLET BY MOUTH EVERY DAY Patient not taking: Reported on 10/19/2014 07/14/14   Dale [place], MD   BP 141/77 mmHg  Pulse 73  Temp(Src) 97.6 F (36.4 C) (Oral)  Resp 16  Ht 5\' 7"  (1.702 m)  Wt 206 lb (93.441 kg)  BMI 32.26 kg/m2  SpO2 97% Physical Exam  Constitutional: He is oriented to person, place, and time. Vital signs are normal. He appears well-developed and well-nourished.  Non-toxic appearance. He does not appear ill. No distress.  HENT:  Head: Normocephalic and atraumatic.  Nose: Nose normal.  Mouth/Throat: Oropharynx is clear and moist. No oropharyngeal exudate.  Eyes: Conjunctivae and EOM are normal. Pupils are equal, round, and reactive to light. No scleral icterus.  Neck: Normal range of motion. Neck supple. No tracheal deviation, no edema, no erythema and normal range of motion present. No thyroid mass and no thyromegaly present.  Cardiovascular: Normal rate, regular rhythm, S1 normal,  S2 normal, normal heart sounds, intact distal pulses and normal pulses.  Exam reveals no gallop and no friction rub.   No murmur heard. Pulses:      Radial pulses are 2+ on the right side, and 2+ on the left side.       Dorsalis pedis pulses are 2+ on the right side, and 2+ on the left side.  Pulmonary/Chest: Effort normal and breath sounds normal. No respiratory distress. He has no wheezes. He has no rhonchi. He has no rales.  Abdominal: Soft. Normal appearance and bowel sounds are normal. He exhibits no distension, no ascites and no mass. There is no hepatosplenomegaly. There is no tenderness. There is no rebound,  no guarding and no CVA tenderness.  Musculoskeletal: Normal range of motion. He exhibits no edema or tenderness.  Lymphadenopathy:    He has no cervical adenopathy.  Neurological: He is alert and oriented to person, place, and time. He has normal strength. No cranial nerve deficit or sensory deficit.  Right upper chimney 4 out of 5 strength (patient states this is because his degenerative cervical disease).  Normal strength and sensation elsewhere. Normal cerebellar testing.  Skin: Skin is warm, dry and intact. No petechiae and no rash noted. He is not diaphoretic. No erythema. No pallor.  Psychiatric: He has a normal mood and affect. His behavior is normal. Judgment normal.  Nursing note and vitals reviewed.   ED Course  Procedures (including critical care time) Labs Review Labs Reviewed  COMPREHENSIVE METABOLIC PANEL - Abnormal; Notable for the following:    Chloride 100 (*)    Glucose, Bld 113 (*)    All other components within normal limits  LIPASE, BLOOD - Abnormal; Notable for the following:    Lipase 21 (*)    All other components within normal limits  CBC  URINALYSIS, ROUTINE W REFLEX MICROSCOPIC (NOT AT [facility])    Imaging Review Mr Brain Wo Contrast  11/02/2014   CLINICAL DATA:  Woke up at 1 a.m. with severe dizziness and vomiting, chills and tremor, now improving. Known degenerative disc disease, pending outpatient cervical spine MRI, and possible cervical spine surgery.  EXAM: MRI HEAD WITHOUT CONTRAST  MRI CERVICAL SPINE WITHOUT CONTRAST  TECHNIQUE: Multiplanar, multiecho pulse sequences of the brain and surrounding structures, and cervical spine, to include the craniocervical junction and cervicothoracic junction, were obtained without intravenous contrast.  COMPARISON:  MRI of the brain March 23, 2008  FINDINGS: MRI HEAD FINDINGS  The ventricles and sulci are normal for patient's age. No abnormal parenchymal signal, mass lesions, mass effect. No reduced diffusion to suggest  acute ischemia. No susceptibility artifact to suggest hemorrhage.  No abnormal extra-axial fluid collections. 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 No extra-axial masses though, contrast enhanced sequences would be more sensitive. Normal major intracranial vascular flow voids seen at the skull base.  Ocular globes and orbital contents are unremarkable though not tailored for evaluation. No abnormal sellar expansion. Trace paranasal sinus mucosal thickening, LEFT maxillary mucosal retention cysts. The mastoid air cells are well aerated. No suspicious calvarial bone marrow signal. No abnormal sellar expansion. Craniocervical junction maintained.  MRI CERVICAL SPINE FINDINGS  Cervical vertebral bodies and posterior elements are intact and aligned. Straightened cervical lordosis. Moderate C6-7 disc height loss, mild at C4-5 and C5-6 with decreased T2 signal within the cervical disc consistent with mild desiccation. Mild subacute on chronic discogenic endplate changes C4-5 through C6-7. No STIR signal abnormality to suggest acute osseous  process though, sequences do not completely image the cervical spine due to motion.  Cervical spinal cord is normal morphology and signal characteristics. Craniocervical junction is intact. Included prevertebral and paraspinal soft tissues are normal.  Level by level evaluation:  C2-3: No disc bulge, canal stenosis nor neural foraminal narrowing.  C3-4: 2 mm broad-based disc bulge asymmetric to the RIGHT, uncovertebral hypertrophy. Mild canal stenosis. Moderate to severe RIGHT neural foraminal narrowing, mild on the LEFT.  C4-5 2 mm broad-based disc bulge asymmetric to the RIGHT, uncovertebral hypertrophy and mild facet arthropathy. Mild canal stenosis. Moderate to severe RIGHT neural foraminal narrowing.  C5-6: Central disc protrusion broad-based disc bulge in total measuring 2-3 mm. Uncovertebral hypertrophy. Mild canal stenosis. Mild RIGHT,  mild to moderate LEFT neural foraminal narrowing.  C6-7: 3 mm broad-based disc bulge, uncovertebral hypertrophy. Mild canal stenosis. Mild RIGHT neural foraminal narrowing.  C7-T1: No disc bulge, canal stenosis nor neural foraminal narrowing.  IMPRESSION: MRI HEAD: Negative noncontrast MRI of the head. Stable appearance from March 23, 2008.  MRA HEAD: Straightened cervical lordosis without acute fracture or malalignment.  Degenerative changes resulting in mild canal stenosis C3-4 through C6-7.  Neural foraminal narrowing C3-4 thru C6-7: Moderate to severe on the RIGHT at C3-4 and C4-5.   Electronically Signed   By: Awilda Metro M.D.   On: 11/02/2014 06:17   Mr Cervical Spine Wo Contrast  11/02/2014   CLINICAL DATA:  Woke up at 1 a.m. with severe dizziness and vomiting, chills and tremor, now improving. Known degenerative disc disease, pending outpatient cervical spine MRI, and possible cervical spine surgery.  EXAM: MRI HEAD WITHOUT CONTRAST  MRI CERVICAL SPINE WITHOUT CONTRAST  TECHNIQUE: Multiplanar, multiecho pulse sequences of the brain and surrounding structures, and cervical spine, to include the craniocervical junction and cervicothoracic junction, were obtained without intravenous contrast.  COMPARISON:  MRI of the brain March 23, 2008  FINDINGS: MRI HEAD FINDINGS  The ventricles and sulci are normal for patient's age. No abnormal parenchymal signal, mass lesions, mass effect. No reduced diffusion to suggest acute ischemia. No susceptibility artifact to suggest hemorrhage.  No abnormal extra-axial fluid collections. 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 No extra-axial masses though, contrast enhanced sequences would be more sensitive. Normal major intracranial vascular flow voids seen at the skull base.  Ocular globes and orbital contents are unremarkable though not tailored for evaluation. No abnormal sellar expansion. Trace paranasal sinus mucosal  thickening, LEFT maxillary mucosal retention cysts. The mastoid air cells are well aerated. No suspicious calvarial bone marrow signal. No abnormal sellar expansion. Craniocervical junction maintained.  MRI CERVICAL SPINE FINDINGS  Cervical vertebral bodies and posterior elements are intact and aligned. Straightened cervical lordosis. Moderate C6-7 disc height loss, mild at C4-5 and C5-6 with decreased T2 signal within the cervical disc consistent with mild desiccation. Mild subacute on chronic discogenic endplate changes C4-5 through C6-7. No STIR signal abnormality to suggest acute osseous process though, sequences do not completely image the cervical spine due to motion.  Cervical spinal cord is normal morphology and signal characteristics. Craniocervical junction is intact. Included prevertebral and paraspinal soft tissues are normal.  Level by level evaluation:  C2-3: No disc bulge, canal stenosis nor neural foraminal narrowing.  C3-4: 2 mm broad-based disc bulge asymmetric to the RIGHT, uncovertebral hypertrophy. Mild canal stenosis. Moderate to severe RIGHT neural foraminal narrowing, mild on the LEFT.  C4-5 2 mm broad-based disc bulge asymmetric to the RIGHT, uncovertebral hypertrophy and mild  facet arthropathy. Mild canal stenosis. Moderate to severe RIGHT neural foraminal narrowing.  C5-6: Central disc protrusion broad-based disc bulge in total measuring 2-3 mm. Uncovertebral hypertrophy. Mild canal stenosis. Mild RIGHT, mild to moderate LEFT neural foraminal narrowing.  C6-7: 3 mm broad-based disc bulge, uncovertebral hypertrophy. Mild canal stenosis. Mild RIGHT neural foraminal narrowing.  C7-T1: No disc bulge, canal stenosis nor neural foraminal narrowing.  IMPRESSION: MRI HEAD: Negative noncontrast MRI of the head. Stable appearance from March 23, 2008.  MRA HEAD: Straightened cervical lordosis without acute fracture or malalignment.  Degenerative changes resulting in mild canal stenosis C3-4 through  C6-7.  Neural foraminal narrowing C3-4 thru C6-7: Moderate to severe on the RIGHT at C3-4 and C4-5.   Electronically Signed   By: Awilda Metro M.D.   On: 11/02/2014 06:17   I have personally reviewed and evaluated these images and lab results as part of my medical decision-making.   EKG Interpretation   Date/Time:  Thursday November 02 2014 03:58:31 EDT Ventricular Rate:  67 PR Interval:  170 QRS Duration: 90 QT Interval:  432 QTC Calculation: 456 R Axis:   71 Text Interpretation:  Normal sinus rhythm Normal ECG No old tracing to  compare Confirmed by [facility]  MD, DAVID ([id number]) on 11/02/2014 4:03:21 AM      MDM   Final diagnoses:  Dizziness   patient presents emergency department for dizziness. It is very difficult to tell this is vertigo versus possible stroke. Will obtain MRI of the brain for evaluation. He is supposed to take an MRI of the cervical spine outpatient for workup of his neck pain, this will be obtained as well. Patient was also given meclizine for possible vertigo treatment.  Upon repeat evaluation, patient states his dizziness has resolved. We'll discharge with prescription. MRI is negative for acute stroke, his cervical spine does show some degenerative changes. Copy of the report will be given to the patient. He currently appears well and in no acute distress. Vital signs remain within his normal limits and is safe for discharge. Patient was also educated on the Epley maneuver to use at home.  Tomasita Crumble, MD 11/02/14 [phone]

## 2015-01-22 ENCOUNTER — Encounter: Payer: Self-pay | Admitting: Internal Medicine

## 2015-01-22 DIAGNOSIS — M4722 Other spondylosis with radiculopathy, cervical region: Secondary | ICD-10-CM | POA: Insufficient documentation

## 2015-04-23 ENCOUNTER — Encounter: Payer: Self-pay | Admitting: Nurse Practitioner

## 2015-04-23 ENCOUNTER — Ambulatory Visit (INDEPENDENT_AMBULATORY_CARE_PROVIDER_SITE_OTHER): Payer: 59 | Admitting: Nurse Practitioner

## 2015-04-23 VITALS — BP 108/62 | HR 103 | Temp 98.3°F | Resp 16 | Ht 67.0 in | Wt 215.6 lb

## 2015-04-23 DIAGNOSIS — F419 Anxiety disorder, unspecified: Secondary | ICD-10-CM | POA: Diagnosis not present

## 2015-04-23 MED ORDER — BUPROPION HCL ER (XL) 150 MG PO TB24: 150.0000 mg | ORAL_TABLET | Freq: Every day | ORAL | Status: DC

## 2015-04-23 MED ORDER — BUSPIRONE HCL 7.5 MG PO TABS: 7.5000 mg | ORAL_TABLET | Freq: Two times a day (BID) | ORAL | Status: DC | PRN

## 2015-04-23 NOTE — Progress Notes (Signed)
Patient ID: Devin Humphrey, male    DOB: 20-Aug-1970  Age: 45 y.o. MRN: [id number]  CC: Anxiety   HPI Devin Humphrey presents for CC of anxiety with driving.   1) 9-10 years of anxiety with driving  [place]  Mind is running a lot  Feels very tense No h/o accidents  Zoloft- not helpful  Denies panic attacks  GAD-7 =14  History Devin Humphrey has a past medical history of GERD (gastroesophageal reflux disease) and Degenerative disc disease.   He has past surgical history that includes Colonoscopy and Upper gi endoscopy.   His family history includes Heart disease in his father; Hyperlipidemia in his father; Hypertension in his father; Stroke in his father. There is no history of Colon cancer or Prostate cancer.He reports that he has never smoked. He has never used smokeless tobacco. He reports that he drinks alcohol. He reports that he does not use illicit drugs.  Outpatient Prescriptions Prior to Visit  Medication Sig Dispense Refill  . meclizine (ANTIVERT) 50 MG tablet Take 1 tablet (50 mg total) by mouth 2 (two) times daily as needed for dizziness. 10 tablet 0  . amoxicillin-clavulanate (AUGMENTIN) 875-125 MG per tablet Take 1 tablet by mouth 2 (two) times daily. (Patient not taking: Reported on 11/02/2014) 14 tablet 0  . benzonatate (TESSALON) 100 MG capsule Take 1 capsule (100 mg total) by mouth 3 (three) times daily as needed for cough. (Patient not taking: Reported on 11/02/2014) 20 capsule 0  . sertraline (ZOLOFT) 50 MG tablet TAKE 1 TABLET BY MOUTH EVERY DAY (Patient not taking: Reported on 10/19/2014) 30 tablet 1  . sertraline (ZOLOFT) 50 MG tablet TAKE 1 TABLET BY MOUTH EVERY DAY (Patient not taking: Reported on 10/19/2014) 30 tablet 2   No facility-administered medications prior to visit.    ROS Review of Systems  Constitutional: Negative for fever, chills, diaphoresis and fatigue.  Eyes: Negative for visual disturbance.   Psychiatric/Behavioral: Negative for suicidal ideas and sleep disturbance. The patient is nervous/anxious.     Objective:  BP 108/62 mmHg  Pulse 103  Temp(Src) 98.3 F (36.8 C) (Oral)  Resp 16  Ht  (1.702 m)  Wt 215 lb 9.6 oz (97.796 kg)  BMI 33.76 kg/m2  SpO2 96%  Physical Exam  Constitutional: He is oriented to person, place, and time. He appears well-developed and well-nourished. No distress.  HENT:  Head: Normocephalic and atraumatic.  Right Ear: External ear normal.  Left Ear: External ear normal.  Cardiovascular: Regular rhythm and normal heart sounds.  Exam reveals no gallop and no friction rub.   No murmur heard. Slightly tachycardic  Pulmonary/Chest: Effort normal and breath sounds normal. No respiratory distress. He has no wheezes. He has no rales. He exhibits no tenderness.  Neurological: He is alert and oriented to person, place, and time.  Skin: Skin is warm and dry. No rash noted. He is not diaphoretic.  Psychiatric: He has a normal mood and affect. His behavior is normal. Judgment and thought content normal.   Assessment & Plan:   Devin Humphrey was seen today for anxiety.  Diagnoses and all orders for this visit:  Anxiety  Other orders -     buPROPion (WELLBUTRIN XL) 150 MG 24 hr tablet; Take 1 tablet (150 mg total) by mouth daily. -     busPIRone (BUSPAR) 7.5 MG tablet; Take 1 tablet (7.5 mg total) by mouth 2 (two) times daily as needed.  I have discontinued  Devin Humphrey sertraline, sertraline, amoxicillin-clavulanate, and benzonatate. I am also having him start on buPROPion and busPIRone. Additionally, I am having him maintain his meclizine.  Meds ordered this encounter  Medications  . buPROPion (WELLBUTRIN XL) 150 MG 24 hr tablet    Sig: Take 1 tablet (150 mg total) by mouth daily.    Dispense:  30 tablet    Refill:  1    Order Specific Question:  Supervising Provider    Answer:  Duncan Dull L [[id number]]  . busPIRone (BUSPAR) 7.5 MG tablet    Sig:  Take 1 tablet (7.5 mg total) by mouth 2 (two) times daily as needed.    Dispense:  60 tablet    Refill:  0    Order Specific Question:  Supervising Provider    Answer:  Sherlene Shams [[id number]]     Follow-up: Return in about 4 weeks (around 05/21/2015) for Follow up of medication .

## 2015-04-23 NOTE — Patient Instructions (Addendum)
Follow up with me or Dr. Lorin Picket in 4 weeks.   Wellbutrin once daily  Buspirone once to twice daily- can make you tired at first   Massage- [employer] in [place]

## 2015-04-29 NOTE — Assessment & Plan Note (Addendum)
Established problem worsening recently Patient has not gone to counseling recently See previous note from Dr. Lorin PicketScott 12/19/2013 After discussion patient is willing to try Wellbutrin and BuSpar Will use buspirone as needed while awaiting the 4-6 weeks to see if the Wellbutrin is helpful.  Discussed possibly using Massage as adjunct for stress relief FU in 4 weeks with myself or Dr. Lorin PicketScott

## 2015-05-18 ENCOUNTER — Encounter: Payer: Self-pay | Admitting: Internal Medicine

## 2015-05-18 ENCOUNTER — Ambulatory Visit (INDEPENDENT_AMBULATORY_CARE_PROVIDER_SITE_OTHER): Payer: 59 | Admitting: Internal Medicine

## 2015-05-18 VITALS — BP 118/80 | HR 87 | Temp 97.8°F | Resp 18 | Ht 67.0 in | Wt 209.1 lb

## 2015-05-18 DIAGNOSIS — M5090 Cervical disc disorder, unspecified, unspecified cervical region: Secondary | ICD-10-CM | POA: Diagnosis not present

## 2015-05-18 DIAGNOSIS — Z125 Encounter for screening for malignant neoplasm of prostate: Secondary | ICD-10-CM | POA: Diagnosis not present

## 2015-05-18 DIAGNOSIS — F419 Anxiety disorder, unspecified: Secondary | ICD-10-CM

## 2015-05-18 DIAGNOSIS — E7800 Pure hypercholesterolemia, unspecified: Secondary | ICD-10-CM | POA: Diagnosis not present

## 2015-05-18 NOTE — Progress Notes (Signed)
Pre-visit discussion using our clinic review tool. No additional management support is needed unless otherwise documented below in the visit note.  

## 2015-05-18 NOTE — Progress Notes (Signed)
Patient ID: Devin Humphrey, male   DOB: 12/28/1970, 45 y.o.   MRN: [id number]   Subjective:    Patient ID: Devin Humphrey, male    DOB: 1970/08/28, 45 y.o.   MRN: [id number]  HPI  Patient here for a scheduled follow up.  Doing well.  Feels good.  Feels better.  Taking wellbutrin regularly.  Feel this is helping more than anything he has tried previously.  Controlling anxiety better.  No chest pain or tightness.  No sob.  No acid reflux.  No abdominal pain or cramping.  Bowels stable.     Past Medical History  Diagnosis Date  . GERD (gastroesophageal reflux disease)   . Degenerative disc disease     cervical disc disease   Past Surgical History  Procedure Laterality Date  . Colonoscopy    . Upper gi endoscopy     Family History  Problem Relation Age of Onset  . Hyperlipidemia Father   . Heart disease Father     s/p CABG and stent  . Stroke Father   . Hypertension Father   . Colon cancer Neg Hx   . Prostate cancer Neg Hx    Social History   Social History  . Marital Status: Married    Spouse Name: N/A  . Number of Children: N/A  . Years of Education: N/A   Social History Main Topics  . Smoking status: Never Smoker   . Smokeless tobacco: Never Used  . Alcohol Use: 0.0 oz/week    0 Standard drinks or equivalent per week  . Drug Use: No  . Sexual Activity: Not Asked   Other Topics Concern  . None   Social History Narrative    Outpatient Encounter Prescriptions as of 05/18/2015  Medication Sig  . buPROPion (WELLBUTRIN XL) 150 MG 24 hr tablet Take 1 tablet (150 mg total) by mouth daily.  . busPIRone (BUSPAR) 7.5 MG tablet Take 1 tablet (7.5 mg total) by mouth 2 (two) times daily as needed.  . meclizine (ANTIVERT) 50 MG tablet Take 1 tablet (50 mg total) by mouth 2 (two) times daily as needed for dizziness.   No facility-administered encounter medications on file as of 05/18/2015.    Review of Systems  Constitutional: Negative for appetite change and  unexpected weight change.  HENT: Negative for congestion and sinus pressure.   Respiratory: Negative for cough, chest tightness and shortness of breath.   Cardiovascular: Negative for chest pain, palpitations and leg swelling.  Gastrointestinal: Negative for nausea, vomiting, abdominal pain and diarrhea.  Genitourinary: Negative for dysuria and difficulty urinating.  Musculoskeletal:       Neck pain - stable.    Skin: Negative for color change and rash.  Neurological: Negative for dizziness, light-headedness and headaches.  Psychiatric/Behavioral: Negative for dysphoric mood and agitation.       Doing better with increased anxiety.  Doing well on wellbutrin.  Has not needed buspar.  Feels better.         Objective:    Physical Exam  Constitutional: He appears well-developed and well-nourished. No distress.  HENT:  Nose: Nose normal.  Mouth/Throat: Oropharynx is clear and moist.  Neck: Neck supple. No thyromegaly present.  Cardiovascular: Normal rate and regular rhythm.   Pulmonary/Chest: Effort normal and breath sounds normal. No respiratory distress.  Abdominal: Soft. Bowel sounds are normal. There is no tenderness.  Musculoskeletal: He exhibits no edema or tenderness.  Lymphadenopathy:    He has no cervical adenopathy.  Skin: No  rash noted. No erythema.  Psychiatric: He has a normal mood and affect. His behavior is normal.    BP 118/80 mmHg  Pulse 87  Temp(Src) 97.8 F (36.6 C) (Oral)  Resp 18  Ht 5\' 7"  (1.702 m)  Wt 209 lb 2 oz (94.858 kg)  BMI 32.75 kg/m2  SpO2 95% Wt Readings from Last 3 Encounters:  05/18/15 209 lb 2 oz (94.858 kg)  04/23/15 215 lb 9.6 oz (97.796 kg)  11/02/14 206 lb (93.441 kg)     Lab Results  Component Value Date   WBC 7.7 11/02/2014   HGB 15.7 11/02/2014   HCT 45.8 11/02/2014   PLT 222 11/02/2014   GLUCOSE 113* 11/02/2014   CHOL 191 01/17/2014   TRIG 146.0 01/17/2014   HDL 27.90* 01/17/2014   LDLCALC 134* 01/17/2014   ALT 27  11/02/2014   AST 31 11/02/2014   NA 138 11/02/2014   K 4.0 11/02/2014   CL 100* 11/02/2014   CREATININE 1.05 11/02/2014   BUN 20 11/02/2014   CO2 25 11/02/2014   TSH 1.24 08/24/2013   PSA 0.75 08/24/2013        Assessment & Plan:   Problem List Items Addressed This Visit    Anxiety - Primary    Doing well on wellbutrin.  Feels better.  Anxiety better.  Not needing buspar.  Has if needed.  Follow.  Continue wellbutrin.        Relevant Orders   CBC with Differential/Platelet   TSH   Cervical disc disease    Neck stable.  Follow.       Hypercholesterolemia    Low cholesterol diet and exercise.  Follow lipid panel.  Get him in for fasting labs.        Relevant Orders   Comprehensive metabolic panel   Lipid panel    Other Visit Diagnoses    Prostate cancer screening        Relevant Orders    PSA        Dale DurhamSCOTT, Kashton Mcartor, MD

## 2015-05-20 ENCOUNTER — Encounter: Payer: Self-pay | Admitting: Internal Medicine

## 2015-05-20 NOTE — Assessment & Plan Note (Signed)
Neck stable.  Follow.

## 2015-05-20 NOTE — Assessment & Plan Note (Signed)
Low cholesterol diet and exercise.  Follow lipid panel.  Get him in for fasting labs.

## 2015-05-20 NOTE — Assessment & Plan Note (Signed)
Doing well on wellbutrin.  Feels better.  Anxiety better.  Not needing buspar.  Has if needed.  Follow.  Continue wellbutrin.

## 2015-06-06 ENCOUNTER — Other Ambulatory Visit: Payer: 59

## 2015-06-26 ENCOUNTER — Other Ambulatory Visit: Payer: Self-pay | Admitting: Nurse Practitioner

## 2015-07-27 ENCOUNTER — Ambulatory Visit: Payer: 59 | Admitting: Internal Medicine

## 2015-07-27 DIAGNOSIS — Z0289 Encounter for other administrative examinations: Secondary | ICD-10-CM

## 2015-08-27 ENCOUNTER — Other Ambulatory Visit: Payer: Self-pay

## 2015-08-27 MED ORDER — BUSPIRONE HCL 7.5 MG PO TABS: ORAL_TABLET | ORAL | Status: DC

## 2015-08-27 NOTE — Telephone Encounter (Signed)
Medication refilled

## 2015-09-05 ENCOUNTER — Other Ambulatory Visit: Payer: Self-pay | Admitting: Nurse Practitioner

## 2015-09-05 NOTE — Telephone Encounter (Signed)
Needs a physical scheduled.  Need to schedule and then ok to refill until appt.

## 2015-09-05 NOTE — Telephone Encounter (Signed)
Patient was last seen in March, Please advise for refill, thanks

## 2015-09-06 ENCOUNTER — Telehealth: Payer: Self-pay

## 2015-09-06 NOTE — Telephone Encounter (Signed)
Ok. I called pt and left a voicemail for pt to call me to sch physical appt.

## 2015-09-06 NOTE — Telephone Encounter (Signed)
Call attempted to schedule appt, left VM.

## 2015-09-06 NOTE — Telephone Encounter (Signed)
Please schedule patient for a physical so I can refill his medications.  thanks

## 2015-09-06 NOTE — Telephone Encounter (Signed)
Noted  

## 2015-09-11 NOTE — Telephone Encounter (Signed)
Spoke with wife, notified that Rx was sent, thanks

## 2015-09-11 NOTE — Telephone Encounter (Signed)
Pt wife called to sch appt. I sch pt for his physical the next available appt was not until 12/11/15. Can pt get refill to hold him until his appt?   buPROPion (WELLBUTRIN XL) 150 MG 24 hr tablet  Pharmacy is [facility] - [place], [place] - [address]  Call wife @ [phone]. Thank you!

## 2015-12-11 ENCOUNTER — Encounter (INDEPENDENT_AMBULATORY_CARE_PROVIDER_SITE_OTHER): Payer: Self-pay

## 2015-12-11 ENCOUNTER — Ambulatory Visit (INDEPENDENT_AMBULATORY_CARE_PROVIDER_SITE_OTHER): Payer: 59 | Admitting: Internal Medicine

## 2015-12-11 ENCOUNTER — Encounter: Payer: Self-pay | Admitting: Internal Medicine

## 2015-12-11 VITALS — BP 142/88 | HR 88 | Temp 98.3°F | Ht 68.0 in | Wt 203.6 lb

## 2015-12-11 DIAGNOSIS — Z0000 Encounter for general adult medical examination without abnormal findings: Secondary | ICD-10-CM | POA: Diagnosis not present

## 2015-12-11 DIAGNOSIS — M5090 Cervical disc disorder, unspecified, unspecified cervical region: Secondary | ICD-10-CM | POA: Diagnosis not present

## 2015-12-11 DIAGNOSIS — F419 Anxiety disorder, unspecified: Secondary | ICD-10-CM | POA: Diagnosis not present

## 2015-12-11 DIAGNOSIS — E7800 Pure hypercholesterolemia, unspecified: Secondary | ICD-10-CM

## 2015-12-11 MED ORDER — BUPROPION HCL ER (XL) 300 MG PO TB24: 300.0000 mg | ORAL_TABLET | Freq: Every day | ORAL | 2 refills | Status: DC

## 2015-12-11 NOTE — Assessment & Plan Note (Signed)
Low cholesterol diet and exercise.  Follow lipid panel.  Due for labs.

## 2015-12-11 NOTE — Progress Notes (Signed)
Pre visit review using our clinic review tool, if applicable. No additional management support is needed unless otherwise documented below in the visit note. 

## 2015-12-11 NOTE — Assessment & Plan Note (Signed)
Physical today 12/11/15.  Check psa with next labs.

## 2015-12-11 NOTE — Assessment & Plan Note (Signed)
Will increase wellbutrin to 300mg  q day.  Follow.  Notify me if problems.

## 2015-12-11 NOTE — Progress Notes (Signed)
Patient ID: Devin Humphrey, male   DOB: [birth year], 45 y.o.   MRN: [id number]   Subjective:    Patient ID: Devin Humphrey, male    DOB: [birth year], 45 y.o.   MRN: [id number]  HPI  Patient here for a physical exam.  He is doing well.  Working a lot.  Increased stress related to this.  Overall handling things relatively well.  Would like to increase his wellbutrin.  Discussed increasing the dose.  No chest pain.  No sob.  No acid reflux.  No abdominal pain.  Bowels doing well.     Past Medical History:  Diagnosis Date  . Degenerative disc disease    cervical disc disease  . GERD (gastroesophageal reflux disease)    Past Surgical History:  Procedure Laterality Date  . COLONOSCOPY    . UPPER GI ENDOSCOPY     Family History  Problem Relation Age of Onset  . Hyperlipidemia Father   . Heart disease Father     s/p CABG and stent  . Stroke Father   . Hypertension Father   . Colon cancer Neg Hx   . Prostate cancer Neg Hx    Social History   Social History  . Marital status: Married    Spouse name: N/A  . Number of children: N/A  . Years of education: N/A   Social History Main Topics  . Smoking status: Never Smoker  . Smokeless tobacco: Never Used  . Alcohol use 0.0 oz/week  . Drug use: No  . Sexual activity: Not Asked   Other Topics Concern  . None   Social History Narrative  . None    Outpatient Encounter Prescriptions as of 12/11/2015  Medication Sig  . [DISCONTINUED] buPROPion (WELLBUTRIN XL) 150 MG 24 hr tablet TAKE 1 TABLET BY MOUTH DAILY  . buPROPion (WELLBUTRIN XL) 300 MG 24 hr tablet Take 1 tablet (300 mg total) by mouth daily.  . [DISCONTINUED] busPIRone (BUSPAR) 7.5 MG tablet TAKE 1 TABLET (7.5 MG TOTAL) BY MOUTH 2 (TWO) TIMES DAILY AS NEEDED.  . [DISCONTINUED] meclizine (ANTIVERT) 50 MG tablet Take 1 tablet (50 mg total) by mouth 2 (two) times daily as needed for dizziness.   No facility-administered encounter medications on file as of 12/11/2015.       Review of Systems  Constitutional: Negative for appetite change and unexpected weight change.  HENT: Negative for congestion and sinus pressure.   Eyes: Negative for pain and visual disturbance.  Respiratory: Negative for cough, chest tightness and shortness of breath.   Cardiovascular: Negative for chest pain, palpitations and leg swelling.  Gastrointestinal: Negative for abdominal pain, diarrhea, nausea and vomiting.  Genitourinary: Negative for difficulty urinating and dysuria.  Musculoskeletal: Negative for back pain and joint swelling.  Skin: Negative for color change and rash.  Neurological: Negative for dizziness, light-headedness and headaches.  Hematological: Negative for adenopathy. Does not bruise/bleed easily.  Psychiatric/Behavioral: Negative for agitation and dysphoric mood.       Increased stress as outlined.         Objective:     Blood pressure rechecked by me:  124/76  Physical Exam  Constitutional: He is oriented to person, place, and time. He appears well-developed and well-nourished. No distress.  HENT:  Head: Normocephalic and atraumatic.  Nose: Nose normal.  Mouth/Throat: Oropharynx is clear and moist. No oropharyngeal exudate.  Eyes: Conjunctivae are normal. Right eye exhibits no discharge. Left eye exhibits no discharge.  Neck: Neck supple. No thyromegaly present.  Cardiovascular: Normal rate and regular rhythm.   Pulmonary/Chest: Breath sounds normal. No respiratory distress. He has no wheezes.  Abdominal: Soft. Bowel sounds are normal. There is no tenderness.  Genitourinary:  Genitourinary Comments: Rectal exam - no palpable prostate nodules.  Heme negative.    Musculoskeletal: He exhibits no edema or tenderness.  Lymphadenopathy:    He has no cervical adenopathy.  Neurological: He is alert and oriented to person, place, and time.  Skin: Skin is warm and dry. No rash noted. No erythema.  Psychiatric: He has a normal mood and affect. His behavior  is normal.    BP (!) 142/88   Pulse 88   Temp 98.3 F (36.8 C) (Oral)   Ht 5\' 8"  (1.727 m)   Wt 203 lb 9.6 oz (92.4 kg)   SpO2 95%   BMI 30.96 kg/m  Wt Readings from Last 3 Encounters:  12/11/15 203 lb 9.6 oz (92.4 kg)  05/18/15 209 lb 2 oz (94.9 kg)  04/23/15 215 lb 9.6 oz (97.8 kg)     Lab Results  Component Value Date   WBC 7.7 11/02/2014   HGB 15.7 11/02/2014   HCT 45.8 11/02/2014   PLT 222 11/02/2014   GLUCOSE 113 (H) 11/02/2014   CHOL 191 01/17/2014   TRIG 146.0 01/17/2014   HDL 27.90 (L) 01/17/2014   LDLCALC 134 (H) 01/17/2014   ALT 27 11/02/2014   AST 31 11/02/2014   NA 138 11/02/2014   K 4.0 11/02/2014   CL 100 (L) 11/02/2014   CREATININE 1.05 11/02/2014   BUN 20 11/02/2014   CO2 25 11/02/2014   TSH 1.24 08/24/2013   PSA 0.75 08/24/2013    Mr Brain Wo Contrast  Result Date: 11/02/2014 CLINICAL DATA:  Woke up at 1 a.m. with severe dizziness and vomiting, chills and tremor, now improving. Known degenerative disc disease, pending outpatient cervical spine MRI, and possible cervical spine surgery. EXAM: MRI HEAD WITHOUT CONTRAST MRI CERVICAL SPINE WITHOUT CONTRAST TECHNIQUE: Multiplanar, multiecho pulse sequences of the brain and surrounding structures, and cervical spine, to include the craniocervical junction and cervicothoracic junction, were obtained without intravenous contrast. COMPARISON:  MRI of the brain March 23, 2008 FINDINGS: MRI HEAD FINDINGS The ventricles and sulci are normal for patient's age. No abnormal parenchymal signal, mass lesions, mass effect. No reduced diffusion to suggest acute ischemia. No susceptibility artifact to suggest hemorrhage. No abnormal extra-axial fluid collections. 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 No extra-axial masses though, contrast enhanced sequences would be more sensitive. Normal major intracranial vascular flow voids seen at the skull base. Ocular globes and orbital  contents are unremarkable though not tailored for evaluation. No abnormal sellar expansion. Trace paranasal sinus mucosal thickening, LEFT maxillary mucosal retention cysts. The mastoid air cells are well aerated. No suspicious calvarial bone marrow signal. No abnormal sellar expansion. Craniocervical junction maintained. MRI CERVICAL SPINE FINDINGS Cervical vertebral bodies and posterior elements are intact and aligned. Straightened cervical lordosis. Moderate C6-7 disc height loss, mild at C4-5 and C5-6 with decreased T2 signal within the cervical disc consistent with mild desiccation. Mild subacute on chronic discogenic endplate changes C4-5 through C6-7. No STIR signal abnormality to suggest acute osseous process though, sequences do not completely image the cervical spine due to motion. Cervical spinal cord is normal morphology and signal characteristics. Craniocervical junction is intact. Included prevertebral and paraspinal soft tissues are normal. Level by level evaluation: C2-3: No disc bulge, canal stenosis nor neural foraminal narrowing. C3-4:  2 mm broad-based disc bulge asymmetric to the RIGHT, uncovertebral hypertrophy. Mild canal stenosis. Moderate to severe RIGHT neural foraminal narrowing, mild on the LEFT. C4-5 2 mm broad-based disc bulge asymmetric to the RIGHT, uncovertebral hypertrophy and mild facet arthropathy. Mild canal stenosis. Moderate to severe RIGHT neural foraminal narrowing. C5-6: Central disc protrusion broad-based disc bulge in total measuring 2-3 mm. Uncovertebral hypertrophy. Mild canal stenosis. Mild RIGHT, mild to moderate LEFT neural foraminal narrowing. C6-7: 3 mm broad-based disc bulge, uncovertebral hypertrophy. Mild canal stenosis. Mild RIGHT neural foraminal narrowing. C7-T1: No disc bulge, canal stenosis nor neural foraminal narrowing. IMPRESSION: MRI HEAD: Negative noncontrast MRI of the head. Stable appearance from March 23, 2008. MRA HEAD: Straightened cervical  lordosis without acute fracture or malalignment. Degenerative changes resulting in mild canal stenosis C3-4 through C6-7. Neural foraminal narrowing C3-4 thru C6-7: Moderate to severe on the RIGHT at C3-4 and C4-5. Electronically Signed   By: Awilda Metro M.D.   On: 11/02/2014 06:17   Mr Cervical Spine Wo Contrast  Result Date: 11/02/2014 CLINICAL DATA:  Woke up at 1 a.m. with severe dizziness and vomiting, chills and tremor, now improving. Known degenerative disc disease, pending outpatient cervical spine MRI, and possible cervical spine surgery. EXAM: MRI HEAD WITHOUT CONTRAST MRI CERVICAL SPINE WITHOUT CONTRAST TECHNIQUE: Multiplanar, multiecho pulse sequences of the brain and surrounding structures, and cervical spine, to include the craniocervical junction and cervicothoracic junction, were obtained without intravenous contrast. COMPARISON:  MRI of the brain March 23, 2008 FINDINGS: MRI HEAD FINDINGS The ventricles and sulci are normal for patient's age. No abnormal parenchymal signal, mass lesions, mass effect. No reduced diffusion to suggest acute ischemia. No susceptibility artifact to suggest hemorrhage. No abnormal extra-axial fluid collections. 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 No extra-axial masses though, contrast enhanced sequences would be more sensitive. Normal major intracranial vascular flow voids seen at the skull base. Ocular globes and orbital contents are unremarkable though not tailored for evaluation. No abnormal sellar expansion. Trace paranasal sinus mucosal thickening, LEFT maxillary mucosal retention cysts. The mastoid air cells are well aerated. No suspicious calvarial bone marrow signal. No abnormal sellar expansion. Craniocervical junction maintained. MRI CERVICAL SPINE FINDINGS Cervical vertebral bodies and posterior elements are intact and aligned. Straightened cervical lordosis. Moderate C6-7 disc height loss, mild at C4-5  and C5-6 with decreased T2 signal within the cervical disc consistent with mild desiccation. Mild subacute on chronic discogenic endplate changes C4-5 through C6-7. No STIR signal abnormality to suggest acute osseous process though, sequences do not completely image the cervical spine due to motion. Cervical spinal cord is normal morphology and signal characteristics. Craniocervical junction is intact. Included prevertebral and paraspinal soft tissues are normal. Level by level evaluation: C2-3: No disc bulge, canal stenosis nor neural foraminal narrowing. C3-4: 2 mm broad-based disc bulge asymmetric to the RIGHT, uncovertebral hypertrophy. Mild canal stenosis. Moderate to severe RIGHT neural foraminal narrowing, mild on the LEFT. C4-5 2 mm broad-based disc bulge asymmetric to the RIGHT, uncovertebral hypertrophy and mild facet arthropathy. Mild canal stenosis. Moderate to severe RIGHT neural foraminal narrowing. C5-6: Central disc protrusion broad-based disc bulge in total measuring 2-3 mm. Uncovertebral hypertrophy. Mild canal stenosis. Mild RIGHT, mild to moderate LEFT neural foraminal narrowing. C6-7: 3 mm broad-based disc bulge, uncovertebral hypertrophy. Mild canal stenosis. Mild RIGHT neural foraminal narrowing. C7-T1: No disc bulge, canal stenosis nor neural foraminal narrowing. IMPRESSION: MRI HEAD: Negative noncontrast MRI of the head. Stable appearance from March 23, 2008. MRA HEAD: Straightened cervical lordosis without acute fracture or malalignment. Degenerative changes resulting in mild canal stenosis C3-4 through C6-7. Neural foraminal narrowing C3-4 thru C6-7: Moderate to severe on the RIGHT at C3-4 and C4-5. Electronically Signed   By: Awilda Metro M.D.   On: 11/02/2014 06:17       Assessment & Plan:   Problem List Items Addressed This Visit    Anxiety    Will increase wellbutrin to 300mg  q day.  Follow.  Notify me if problems.        Relevant Medications   buPROPion (WELLBUTRIN  XL) 300 MG 24 hr tablet   Cervical disc disease    MRI as outlined.  Neck stable.        Health care maintenance    Physical today 12/11/15.  Check psa with next labs.        Hypercholesterolemia    Low cholesterol diet and exercise.  Follow lipid panel.  Due for labs.         Other Visit Diagnoses   None.      Dale [place], MD

## 2015-12-11 NOTE — Assessment & Plan Note (Signed)
MRI as outlined.  Neck stable.

## 2015-12-20 ENCOUNTER — Other Ambulatory Visit (INDEPENDENT_AMBULATORY_CARE_PROVIDER_SITE_OTHER): Payer: 59

## 2015-12-20 DIAGNOSIS — E7800 Pure hypercholesterolemia, unspecified: Secondary | ICD-10-CM

## 2015-12-20 DIAGNOSIS — F419 Anxiety disorder, unspecified: Secondary | ICD-10-CM | POA: Diagnosis not present

## 2015-12-20 DIAGNOSIS — Z125 Encounter for screening for malignant neoplasm of prostate: Secondary | ICD-10-CM

## 2015-12-20 LAB — CBC WITH DIFFERENTIAL/PLATELET
Basophils Absolute: 0 10*3/uL (ref 0.0–0.1)
Basophils Relative: 0.4 % (ref 0.0–3.0)
EOS PCT: 1.3 % (ref 0.0–5.0)
Eosinophils Absolute: 0.1 10*3/uL (ref 0.0–0.7)
HCT: 46.1 % (ref 39.0–52.0)
Hemoglobin: 16 g/dL (ref 13.0–17.0)
LYMPHS ABS: 2.5 10*3/uL (ref 0.7–4.0)
Lymphocytes Relative: 35.4 % (ref 12.0–46.0)
MCHC: 34.7 g/dL (ref 30.0–36.0)
MCV: 84.9 fl (ref 78.0–100.0)
MONOS PCT: 5.7 % (ref 3.0–12.0)
Monocytes Absolute: 0.4 10*3/uL (ref 0.1–1.0)
NEUTROS PCT: 57.2 % (ref 43.0–77.0)
Neutro Abs: 4.1 10*3/uL (ref 1.4–7.7)
Platelets: 316 10*3/uL (ref 150.0–400.0)
RBC: 5.42 Mil/uL (ref 4.22–5.81)
RDW: 13.6 % (ref 11.5–15.5)
WBC: 7.1 10*3/uL (ref 4.0–10.5)

## 2015-12-20 LAB — COMPREHENSIVE METABOLIC PANEL
ALBUMIN: 4.5 g/dL (ref 3.5–5.2)
ALK PHOS: 83 U/L (ref 39–117)
ALT: 18 U/L (ref 0–53)
AST: 19 U/L (ref 0–37)
BUN: 16 mg/dL (ref 6–23)
CALCIUM: 9.6 mg/dL (ref 8.4–10.5)
CO2: 28 mEq/L (ref 19–32)
Chloride: 102 mEq/L (ref 96–112)
Creatinine, Ser: 1.09 mg/dL (ref 0.40–1.50)
GFR: 78.11 mL/min (ref >60.00)
GLUCOSE: 97 mg/dL (ref 70–99)
POTASSIUM: 4.2 meq/L (ref 3.5–5.1)
Sodium: 139 mEq/L (ref 135–145)
TOTAL PROTEIN: 7 g/dL (ref 6.0–8.3)
Total Bilirubin: 0.5 mg/dL (ref 0.2–1.2)

## 2015-12-20 LAB — LIPID PANEL
CHOLESTEROL: 194 mg/dL (ref 0–200)
HDL: 34.7 mg/dL — AB (ref >39.00)
LDL Cholesterol: 119 mg/dL — ABNORMAL HIGH (ref 0–99)
NonHDL: 158.82
TRIGLYCERIDES: 198 mg/dL — AB (ref 0.0–149.0)
Total CHOL/HDL Ratio: 6
VLDL: 39.6 mg/dL (ref 0.0–40.0)

## 2015-12-20 LAB — PSA: PSA: 0.63 ng/mL (ref 0.10–4.00)

## 2015-12-20 LAB — TSH: TSH: 1.77 u[IU]/mL (ref 0.35–4.50)

## 2016-03-20 ENCOUNTER — Ambulatory Visit: Payer: 59 | Admitting: Internal Medicine

## 2016-03-27 ENCOUNTER — Other Ambulatory Visit: Payer: Self-pay | Admitting: Internal Medicine

## 2016-03-27 NOTE — Telephone Encounter (Signed)
[facility] pt has no showed last two app will need to make f/u so that we can refill.

## 2016-03-28 NOTE — Telephone Encounter (Signed)
[facility]

## 2016-03-31 NOTE — Telephone Encounter (Signed)
App made with patient called in refills until that date. Letter mailed for app after address and contact information confirmed.

## 2016-05-09 ENCOUNTER — Other Ambulatory Visit: Payer: Self-pay

## 2016-06-30 ENCOUNTER — Ambulatory Visit: Payer: 59 | Admitting: Internal Medicine

## 2016-07-13 ENCOUNTER — Emergency Department (HOSPITAL_COMMUNITY)
Admission: EM | Admit: 2016-07-13 | Discharge: 2016-07-13 | Disposition: A | Discharge status: Home / Self Care | Payer: 59 | Attending: Emergency Medicine | Admitting: Emergency Medicine

## 2016-07-13 ENCOUNTER — Encounter (HOSPITAL_COMMUNITY): Payer: Self-pay

## 2016-07-13 DIAGNOSIS — R55 Syncope and collapse: Secondary | ICD-10-CM | POA: Insufficient documentation

## 2016-07-13 LAB — CBG MONITORING, ED: GLUCOSE-CAPILLARY: 98 mg/dL (ref 65–99)

## 2016-07-13 LAB — CBC
HCT: 44 % (ref 39.0–52.0)
Hemoglobin: 15.3 g/dL (ref 13.0–17.0)
MCH: 29.6 pg (ref 26.0–34.0)
MCHC: 34.8 g/dL (ref 30.0–36.0)
MCV: 85.1 fL (ref 78.0–100.0)
Platelets: 242 10*3/uL (ref 150–400)
RBC: 5.17 MIL/uL (ref 4.22–5.81)
RDW: 13.1 % (ref 11.5–15.5)
WBC: 6.5 10*3/uL (ref 4.0–10.5)

## 2016-07-13 LAB — BASIC METABOLIC PANEL
Anion gap: 7 (ref 5–15)
BUN: 15 mg/dL (ref 6–20)
CALCIUM: 8.6 mg/dL — AB (ref 8.9–10.3)
CO2: 23 mmol/L (ref 22–32)
CREATININE: 1.12 mg/dL (ref 0.61–1.24)
Chloride: 106 mmol/L (ref 101–111)
Glucose, Bld: 94 mg/dL (ref 65–99)
Potassium: 4.6 mmol/L (ref 3.5–5.1)
SODIUM: 136 mmol/L (ref 135–145)

## 2016-07-13 NOTE — ED Notes (Addendum)
Pt ambulated in hallway with no issues or complaints. 

## 2016-07-13 NOTE — Discharge Instructions (Signed)
The episode of loss of consciousness was likely secondary to being overheated, with secondary low blood pressure.  The shaking was most likely, myoclonic jerking.  Make sure that you are eating and drinking regularly, and stay well-hydrated before during and after exercise.  Return here, if needed, for problems.

## 2016-07-13 NOTE — ED Provider Notes (Signed)
[facility]-EMERGENCY DEPT Provider Note   CSN: [id number] Arrival date & time: 07/13/16  1218     History   Chief Complaint Chief Complaint  Patient presents with  . Seizures  . Loss of Consciousness    HPI Devin Humphrey is a 46 y.o. male.  He is here for evaluation of a syncopal episode preceded by a prodrome of weakness, and appearing pale.  Patient had just completed a 3 mile bike ride, when he felt hot, and weak, he decided to come into the house, to lay down on the floor and put his head between his legs.  He did that, but then he slumped backwards onto the floor and became unconscious.  His wife was with him at the time and noticed that he was banging his head on the ground and his legs were trembling for 1-2 minutes.  Following that he was a bit groggy but responsive, and then became able to drink some water.  Prior to this episode he had not had anything to eat or drink.  Cycling is usual for him typically he jogs about a mile and half on weekends.  There was no associated chest pain, blurred or double vision, vomiting, coughing, focal weakness or paresthesia.  Patient feels back to baseline now.  No new medications.  No other stressors in life.  There are no other known modifying factors.  HPI  Past Medical History:  Diagnosis Date  . Degenerative disc disease    cervical disc disease  . GERD (gastroesophageal reflux disease)     Patient Active Problem List   Diagnosis Date Noted  . Health care maintenance 12/11/2015  . Cervical spondylosis with radiculopathy 01/22/2015  . Right hand pain 10/19/2014  . Sinusitis 10/19/2014  . AP (abdominal pain) 02/27/2014  . Pain in the chest 02/27/2014  . Hypercholesterolemia 12/19/2013  . Anxiety 10/09/2013  . SOB (shortness of breath) 08/27/2013  . Upper respiratory infection 06/13/2013  . Light headedness 09/25/2012  . GERD (gastroesophageal reflux disease) 09/25/2012  . Cervical disc disease 09/25/2012  . CONTACT  DERMATITIS&OTHER ECZEMA DUE TO PLANTS 08/19/2009    Past Surgical History:  Procedure Laterality Date  . COLONOSCOPY    . UPPER GI ENDOSCOPY         Home Medications    Prior to Admission medications   Medication Sig Start Date End Date Taking? Authorizing Provider  buPROPion (WELLBUTRIN XL) 300 MG 24 hr tablet TAKE 1 TABLET BY MOUTH DAILY 03/31/16  Yes Dale [place], MD    Family History Family History  Problem Relation Age of Onset  . Hyperlipidemia Father   . Heart disease Father        s/p CABG and stent  . Stroke Father   . Hypertension Father   . Colon cancer Neg Hx   . Prostate cancer Neg Hx     Social History Social History  Substance Use Topics  . Smoking status: Never Smoker  . Smokeless tobacco: Never Used  . Alcohol use 0.0 oz/week     Allergies   Patient has no known allergies.   Review of Systems Review of Systems  All other systems reviewed and are negative.    Physical Exam Updated Vital Signs BP 123/75 (BP Location: Right Arm)   Pulse 86   Temp 97.8 F (36.6 C) (Oral)   Resp 18   Ht 5\' 8"  (1.727 m)   Wt 205 lb (93 kg)   SpO2 98%   BMI 31.17 kg/m  Physical Exam  Constitutional: He is oriented to person, place, and time. He appears well-developed and well-nourished. No distress.  HENT:  Head: Normocephalic and atraumatic.  Right Ear: External ear normal.  Left Ear: External ear normal.  Eyes: Conjunctivae and EOM are normal. Pupils are equal, round, and reactive to light.  Neck: Normal range of motion and phonation normal. Neck supple.  Cardiovascular: Normal rate, regular rhythm and normal heart sounds.   Pulmonary/Chest: Effort normal and breath sounds normal. He exhibits no bony tenderness.  Abdominal: Soft. There is no tenderness.  Musculoskeletal: Normal range of motion.  Neurological: He is alert and oriented to person, place, and time. No cranial nerve deficit or sensory deficit. He exhibits normal muscle tone.  Coordination normal.  No dysarthria, aphasia, or nystagmus.  Skin: Skin is warm, dry and intact.  Psychiatric: He has a normal mood and affect. His behavior is normal. Judgment and thought content normal.  Nursing note and vitals reviewed.    ED Treatments / Results  Labs (all labs ordered are listed, but only abnormal results are displayed) Labs Reviewed  BASIC METABOLIC PANEL - Abnormal; Notable for the following:       Result Value   Calcium 8.6 (*)    All other components within normal limits  CBC  CBG MONITORING, ED    EKG  EKG Interpretation None       Radiology No results found.  Procedures Procedures (including critical care time)  Medications Ordered in ED Medications - No data to display   Initial Impression / Assessment and Plan / ED Course  I have reviewed the triage vital signs and the nursing notes.  Pertinent labs & imaging results that were available during my care of the patient were reviewed by me and considered in my medical decision making (see chart for details).       Patient Vitals for the past 24 hrs:  BP Temp Temp src Pulse Resp SpO2 Height Weight  07/13/16 1438 123/75 97.8 F (36.6 C) Oral 86 18 98 % - -  07/13/16 1415 126/82 - - 89 18 98 % - -  07/13/16 1400 117/77 - - 88 19 97 % - -  07/13/16 1345 119/80 - - 91 (!) 25 97 % - -  07/13/16 1330 120/70 - - 87 14 97 % - -  07/13/16 1315 108/90 - - 89 19 97 % - -  07/13/16 1311 - 98.7 F (37.1 C) Oral - - - - -  07/13/16 1300 127/78 - - 85 12 97 % - -  07/13/16 1245 (!) 127/98 - - 97 20 97 % - -  07/13/16 1230 134/82 - - 86 15 98 % - -  07/13/16 1226 - - - - - - 5\' 8"  (1.727 m) 205 lb (93 kg)    -At discharge reevaluation with update and discussion. After initial assessment and treatment, an updated evaluation reveals comfortable, eating and drinking, ambulating without difficulty. Devin Humphrey    Final Clinical Impressions(s) / ED Diagnoses   Final diagnoses:  Syncope and  collapse    Syncopal episode, with choking, nonspecific, likely myoclonic.  Doubt seizure, serious bacterial embolic instability.  Doubt cardiac arrhythmia.  Selective vasovagal episode, associated with overexertion, and poor preparation.  Nursing Notes Reviewed/ Care Coordinated Applicable Imaging Reviewed Interpretation of Laboratory Data incorporated into ED treatment  The patient appears reasonably screened and/or stabilized for discharge and I doubt any other medical condition or other [facility] requiring further screening, evaluation,  or treatment in the ED at this time prior to discharge.  Plan: Home Medications-OTC, as needed; Home Treatments-rest, fluids, gradual introduction of exercise; return here if the recommended treatment, does not improve the symptoms; Recommended follow up-PCP, as needed   New Prescriptions Discharge Medication List as of 07/13/2016  2:30 PM       Mancel Bale, MD 07/13/16 1456

## 2016-07-13 NOTE — ED Triage Notes (Signed)
Pt from home with syncopal episode followed by a seizure. Pt has no history of seizure. Pt alert and oriented x4 no pain

## 2016-09-10 ENCOUNTER — Ambulatory Visit (INDEPENDENT_AMBULATORY_CARE_PROVIDER_SITE_OTHER): Payer: No Typology Code available for payment source | Admitting: Internal Medicine

## 2016-09-10 ENCOUNTER — Encounter: Payer: Self-pay | Admitting: Internal Medicine

## 2016-09-10 DIAGNOSIS — F419 Anxiety disorder, unspecified: Secondary | ICD-10-CM | POA: Diagnosis not present

## 2016-09-10 DIAGNOSIS — M5090 Cervical disc disorder, unspecified, unspecified cervical region: Secondary | ICD-10-CM | POA: Diagnosis not present

## 2016-09-10 DIAGNOSIS — Z87898 Personal history of other specified conditions: Secondary | ICD-10-CM

## 2016-09-10 DIAGNOSIS — E7800 Pure hypercholesterolemia, unspecified: Secondary | ICD-10-CM | POA: Diagnosis not present

## 2016-09-10 MED ORDER — BUPROPION HCL ER (XL) 300 MG PO TB24: 300.0000 mg | ORAL_TABLET | Freq: Every day | ORAL | 3 refills | Status: DC

## 2016-09-10 NOTE — Progress Notes (Signed)
Pre-visit discussion using our clinic review tool. No additional management support is needed unless otherwise documented below in the visit note.  

## 2016-09-10 NOTE — Assessment & Plan Note (Signed)
Low cholesterol diet and exercise.  Follow lipid panel.   

## 2016-09-10 NOTE — Assessment & Plan Note (Signed)
MRI as outlined.  Has had neck issues for a while.  Bothering him more now.  Has seen ortho previously.  Had nerve block previously.  Did not help.  Affecting him daily.  Desires further evaluation.  Refer to [facility].

## 2016-09-10 NOTE — Assessment & Plan Note (Signed)
On wellbutrin 300mg  q day.  Not sure if has made a big difference.  Discussed holding on changing medication.  Feels neck is aggravating.  Follow.

## 2016-09-10 NOTE — Assessment & Plan Note (Signed)
Was evaluated in ER. Notes reviewed.  Rode his bike.  Did not eat or drink.  Was extremely humid.  States related to this.  W/up as outlined. Has not had reoccurring episodes.  Is staying hydrated.  Eating regularly and before exercise.  No chest pain.  Follow.
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
## 2016-12-17 ENCOUNTER — Encounter: Payer: No Typology Code available for payment source | Admitting: Internal Medicine

## 2017-02-16 ENCOUNTER — Other Ambulatory Visit: Payer: Self-pay | Admitting: Nurse Practitioner

## 2019-01-23 ENCOUNTER — Emergency Department: Payer: No Typology Code available for payment source

## 2019-01-23 ENCOUNTER — Encounter: Payer: Self-pay | Admitting: Intensive Care

## 2019-01-23 ENCOUNTER — Other Ambulatory Visit: Payer: Self-pay

## 2019-01-23 ENCOUNTER — Inpatient Hospital Stay
Admission: EM | Admit: 2019-01-23 | Discharge: 2019-01-24 | DRG: 177 | Disposition: A | Discharge status: Other Acute Inpatient Hospital | Payer: No Typology Code available for payment source | Attending: Internal Medicine | Admitting: Internal Medicine

## 2019-01-23 DIAGNOSIS — K219 Gastro-esophageal reflux disease without esophagitis: Secondary | ICD-10-CM | POA: Diagnosis present

## 2019-01-23 DIAGNOSIS — Z8249 Family history of ischemic heart disease and other diseases of the circulatory system: Secondary | ICD-10-CM | POA: Diagnosis not present

## 2019-01-23 DIAGNOSIS — Z823 Family history of stroke: Secondary | ICD-10-CM | POA: Diagnosis not present

## 2019-01-23 DIAGNOSIS — R079 Chest pain, unspecified: Secondary | ICD-10-CM

## 2019-01-23 DIAGNOSIS — R55 Syncope and collapse: Secondary | ICD-10-CM | POA: Diagnosis present

## 2019-01-23 DIAGNOSIS — M5090 Cervical disc disorder, unspecified, unspecified cervical region: Secondary | ICD-10-CM | POA: Diagnosis present

## 2019-01-23 DIAGNOSIS — E860 Dehydration: Secondary | ICD-10-CM | POA: Diagnosis present

## 2019-01-23 DIAGNOSIS — J069 Acute upper respiratory infection, unspecified: Secondary | ICD-10-CM | POA: Diagnosis present

## 2019-01-23 DIAGNOSIS — J1289 Other viral pneumonia: Secondary | ICD-10-CM | POA: Diagnosis present

## 2019-01-23 DIAGNOSIS — U071 COVID-19: Secondary | ICD-10-CM | POA: Diagnosis present

## 2019-01-23 DIAGNOSIS — Z8349 Family history of other endocrine, nutritional and metabolic diseases: Secondary | ICD-10-CM

## 2019-01-23 DIAGNOSIS — R0602 Shortness of breath: Secondary | ICD-10-CM

## 2019-01-23 DIAGNOSIS — F419 Anxiety disorder, unspecified: Secondary | ICD-10-CM | POA: Diagnosis present

## 2019-01-23 DIAGNOSIS — J9601 Acute respiratory failure with hypoxia: Secondary | ICD-10-CM | POA: Diagnosis present

## 2019-01-23 LAB — CBC WITH DIFFERENTIAL/PLATELET
Abs Immature Granulocytes: 0.04 10*3/uL (ref 0.00–0.07)
Basophils Absolute: 0 10*3/uL (ref 0.0–0.1)
Basophils Relative: 0 %
Eosinophils Absolute: 0 10*3/uL (ref 0.0–0.5)
Eosinophils Relative: 0 %
HCT: 42.3 % (ref 39.0–52.0)
Hemoglobin: 14.6 g/dL (ref 13.0–17.0)
Immature Granulocytes: 1 %
Lymphocytes Relative: 12 %
Lymphs Abs: 0.9 10*3/uL (ref 0.7–4.0)
MCH: 28.5 pg (ref 26.0–34.0)
MCHC: 34.5 g/dL (ref 30.0–36.0)
MCV: 82.5 fL (ref 80.0–100.0)
Monocytes Absolute: 0.4 10*3/uL (ref 0.1–1.0)
Monocytes Relative: 5 %
Neutro Abs: 6.5 10*3/uL (ref 1.7–7.7)
Neutrophils Relative %: 82 %
Platelets: 185 10*3/uL (ref 150–400)
RBC: 5.13 MIL/uL (ref 4.22–5.81)
RDW: 13 % (ref 11.5–15.5)
WBC: 7.8 10*3/uL (ref 4.0–10.5)
nRBC: 0 % (ref 0.0–0.2)

## 2019-01-23 LAB — COMPREHENSIVE METABOLIC PANEL
ALT: 23 U/L (ref 0–44)
AST: 29 U/L (ref 15–41)
Albumin: 3.8 g/dL (ref 3.5–5.0)
Alkaline Phosphatase: 68 U/L (ref 38–126)
Anion gap: 14 (ref 5–15)
BUN: 10 mg/dL (ref 6–20)
CO2: 25 mmol/L (ref 22–32)
Calcium: 8.5 mg/dL — ABNORMAL LOW (ref 8.9–10.3)
Chloride: 97 mmol/L — ABNORMAL LOW (ref 98–111)
Creatinine, Ser: 1.05 mg/dL (ref 0.61–1.24)
GFR calc Af Amer: >60 mL/min (ref >60)
GFR calc non Af Amer: >60 mL/min (ref >60)
Glucose, Bld: 112 mg/dL — ABNORMAL HIGH (ref 70–99)
Potassium: 3.8 mmol/L (ref 3.5–5.1)
Sodium: 136 mmol/L (ref 135–145)
Total Bilirubin: 0.5 mg/dL (ref 0.3–1.2)
Total Protein: 7.7 g/dL (ref 6.5–8.1)

## 2019-01-23 LAB — FIBRINOGEN: Fibrinogen: >750 mg/dL — ABNORMAL HIGH (ref 210–475)

## 2019-01-23 LAB — FERRITIN: Ferritin: 538 ng/mL — ABNORMAL HIGH (ref 24–336)

## 2019-01-23 LAB — ABO/RH: ABO/RH(D): B POS

## 2019-01-23 LAB — PROCALCITONIN: Procalcitonin: 0.1 ng/mL

## 2019-01-23 LAB — C-REACTIVE PROTEIN: CRP: 21.9 mg/dL — ABNORMAL HIGH (ref <1.0)

## 2019-01-23 LAB — TRIGLYCERIDES: Triglycerides: 103 mg/dL (ref <150)

## 2019-01-23 LAB — TROPONIN I (HIGH SENSITIVITY)
Troponin I (High Sensitivity): 4 ng/L (ref <18)
Troponin I (High Sensitivity): 4 ng/L (ref <18)
Troponin I (High Sensitivity): 4 ng/L (ref <18)

## 2019-01-23 LAB — GLUCOSE, CAPILLARY
Glucose-Capillary: 123 mg/dL — ABNORMAL HIGH (ref 70–99)
Glucose-Capillary: 149 mg/dL — ABNORMAL HIGH (ref 70–99)

## 2019-01-23 LAB — FIBRIN DERIVATIVES D-DIMER (ARMC ONLY): Fibrin derivatives D-dimer (ARMC): 664.37 ng/mL (FEU) — ABNORMAL HIGH (ref 0.00–499.00)

## 2019-01-23 LAB — LACTATE DEHYDROGENASE: LDH: 167 U/L (ref 98–192)

## 2019-01-23 LAB — LACTIC ACID, PLASMA: Lactic Acid, Venous: 1.6 mmol/L (ref 0.5–1.9)

## 2019-01-23 MED ORDER — SODIUM CHLORIDE 0.9 % IV BOLUS
500.0000 mL | Freq: Once | INTRAVENOUS | Status: AC
  Administered 2019-01-23: 13:00:00 500 mL via INTRAVENOUS

## 2019-01-23 MED ORDER — SODIUM CHLORIDE 0.9 % IV SOLN
100.0000 mg | INTRAVENOUS | Status: DC
  Administered 2019-01-24: 09:00:00 100 mg via INTRAVENOUS
  Filled 2019-01-23 (×2): qty 20

## 2019-01-23 MED ORDER — SODIUM CHLORIDE 0.9 % IV SOLN
200.0000 mg | Freq: Once | INTRAVENOUS | Status: AC
  Administered 2019-01-23: 15:00:00 200 mg via INTRAVENOUS
  Filled 2019-01-23: qty 40

## 2019-01-23 MED ORDER — ZINC SULFATE 220 (50 ZN) MG PO CAPS
220.0000 mg | ORAL_CAPSULE | Freq: Every day | ORAL | Status: DC
  Administered 2019-01-23: 20:00:00 220 mg via ORAL
  Filled 2019-01-23 (×2): qty 1

## 2019-01-23 MED ORDER — ACETAMINOPHEN 325 MG PO TABS
650.0000 mg | ORAL_TABLET | Freq: Four times a day (QID) | ORAL | Status: DC | PRN
  Administered 2019-01-24: 08:00:00 650 mg via ORAL
  Filled 2019-01-23: qty 2

## 2019-01-23 MED ORDER — SODIUM CHLORIDE 0.9 % IV SOLN
INTRAVENOUS | Status: AC
  Administered 2019-01-23 – 2019-01-24 (×2): via INTRAVENOUS

## 2019-01-23 MED ORDER — ENOXAPARIN SODIUM 40 MG/0.4ML ~~LOC~~ SOLN
40.0000 mg | SUBCUTANEOUS | Status: DC
  Administered 2019-01-23: 20:00:00 40 mg via SUBCUTANEOUS
  Filled 2019-01-23: qty 0.4

## 2019-01-23 MED ORDER — ACETAMINOPHEN 500 MG PO TABS
1000.0000 mg | ORAL_TABLET | Freq: Once | ORAL | Status: AC
  Administered 2019-01-23: 13:00:00 1000 mg via ORAL
  Filled 2019-01-23: qty 2

## 2019-01-23 MED ORDER — METHYLPREDNISOLONE SODIUM SUCC 40 MG IJ SOLR
40.0000 mg | Freq: Three times a day (TID) | INTRAMUSCULAR | Status: DC
  Administered 2019-01-23 – 2019-01-24 (×3): 40 mg via INTRAVENOUS
  Filled 2019-01-23 (×3): qty 1

## 2019-01-23 MED ORDER — GUAIFENESIN-DM 100-10 MG/5ML PO SYRP
10.0000 mL | ORAL_SOLUTION | ORAL | Status: DC | PRN
  Filled 2019-01-23: qty 10

## 2019-01-23 MED ORDER — HYDRALAZINE HCL 20 MG/ML IJ SOLN: 10.0000 mg | INTRAMUSCULAR | Status: DC | PRN

## 2019-01-23 MED ORDER — ONDANSETRON HCL 4 MG PO TABS: 4.0000 mg | ORAL_TABLET | Freq: Four times a day (QID) | ORAL | Status: DC | PRN

## 2019-01-23 MED ORDER — INSULIN ASPART 100 UNIT/ML ~~LOC~~ SOLN
0.0000 [IU] | SUBCUTANEOUS | Status: DC
  Administered 2019-01-23 – 2019-01-24 (×4): 2 [IU] via SUBCUTANEOUS
  Filled 2019-01-23 (×5): qty 1

## 2019-01-23 MED ORDER — VITAMIN C 500 MG PO TABS
500.0000 mg | ORAL_TABLET | Freq: Every day | ORAL | Status: DC
  Administered 2019-01-23: 20:00:00 500 mg via ORAL
  Filled 2019-01-23 (×2): qty 1

## 2019-01-23 MED ORDER — BUPROPION HCL ER (XL) 150 MG PO TB24: 300.0000 mg | ORAL_TABLET | Freq: Every day | ORAL | Status: DC

## 2019-01-23 MED ORDER — ONDANSETRON HCL 4 MG/2ML IJ SOLN: 4.0000 mg | Freq: Four times a day (QID) | INTRAMUSCULAR | Status: DC | PRN

## 2019-01-23 MED ORDER — SENNOSIDES-DOCUSATE SODIUM 8.6-50 MG PO TABS: 1.0000 | ORAL_TABLET | Freq: Every evening | ORAL | Status: DC | PRN

## 2019-01-23 MED ORDER — HYDROCOD POLST-CPM POLST ER 10-8 MG/5ML PO SUER: 5.0000 mL | Freq: Two times a day (BID) | ORAL | Status: DC | PRN

## 2019-01-23 MED ORDER — IPRATROPIUM-ALBUTEROL 20-100 MCG/ACT IN AERS
1.0000 | INHALATION_SPRAY | Freq: Four times a day (QID) | RESPIRATORY_TRACT | Status: DC
  Administered 2019-01-23 – 2019-01-24 (×3): 1 via RESPIRATORY_TRACT
  Filled 2019-01-23: qty 4

## 2019-01-23 MED ORDER — FAMOTIDINE 20 MG PO TABS
20.0000 mg | ORAL_TABLET | Freq: Two times a day (BID) | ORAL | Status: DC
  Administered 2019-01-23: 20 mg via ORAL
  Filled 2019-01-23: qty 1

## 2019-01-23 NOTE — ED Provider Notes (Signed)
[facility] Emergency Department Provider Note  ____________________________________________   First MD Initiated Contact with Patient 01/23/19 1039     (approximate)  I have reviewed the triage vital signs and the nursing notes.   HISTORY  Chief Complaint Chest Pain    HPI Devin Humphrey is a 48 y.o. male who is coronavirus positive on Thanksgiving who comes in with chest tightness, cough, lethargy.  While patient was in the waiting area patient had a near syncopal episode.  Patient states that he originally came in for shortness of breath that has been moderate, constant, nothing makes better, nothing makes it worse.  Has been taking some over-the-counter decongestions.  He says that before he passed out he felt very sweaty all over and lightheaded and then had a few minutes of LOC.  He was in a wheelchair did not hit his head.  He is not back to baseline.  No seizure-like activity.  No urinating on himself.  He says he has been trying to push the fluids without is not taking as much.  No abdominal pain.          Past Medical History:  Diagnosis Date   Degenerative disc disease    cervical disc disease   GERD (gastroesophageal reflux disease)     Patient Active Problem List   Diagnosis Date Noted   History of syncope 09/10/2016   Health care maintenance 12/11/2015   Cervical spondylosis with radiculopathy 01/22/2015   Right hand pain 10/19/2014   Sinusitis 10/19/2014   AP (abdominal pain) 02/27/2014   Pain in the chest 02/27/2014   Hypercholesterolemia 12/19/2013   Anxiety 10/09/2013   SOB (shortness of breath) 08/27/2013   Upper respiratory infection 06/13/2013   Light headedness 09/25/2012   GERD (gastroesophageal reflux disease) 09/25/2012   Cervical disc disease 09/25/2012   CONTACT DERMATITIS&OTHER ECZEMA DUE TO PLANTS 08/19/2009    Past Surgical History:  Procedure Laterality Date   COLONOSCOPY     UPPER GI  ENDOSCOPY      Prior to Admission medications   Medication Sig Start Date End Date Taking? Authorizing Provider  buPROPion (WELLBUTRIN XL) 300 MG 24 hr tablet TAKE 1 TABLET BY MOUTH ONCE A DAY 02/16/17   Dale Depauville, MD    Allergies Patient has no known allergies.  Family History  Problem Relation Age of Onset   Hyperlipidemia Father    Heart disease Father        s/p CABG and stent   Stroke Father    Hypertension Father    Colon cancer Neg Hx    Prostate cancer Neg Hx     Social History Social History   Tobacco Use   Smoking status: Never Smoker   Smokeless tobacco: Never Used  Substance Use Topics   Alcohol use: Yes    Alcohol/week: 0.0 standard drinks   Drug use: No      Review of Systems Constitutional: Positive fatigue and fevers. Eyes: No visual changes. ENT: No sore throat. Cardiovascular: Positive chest pain Respiratory: Positive shortness of breath, cough Gastrointestinal: No abdominal pain.  No nausea, no vomiting.  No diarrhea.  No constipation. Genitourinary: Negative for dysuria. Musculoskeletal: Negative for back pain. Skin: Negative for rash. Neurological: Negative for headaches, focal weakness or numbness. All other ROS negative ____________________________________________   PHYSICAL EXAM:  VITAL SIGNS: ED Triage Vitals  Enc Vitals Group     BP 01/23/19 1023 (!) 115/59     Pulse Rate 01/23/19 1023 91  Resp 01/23/19 1023 (!) 22     Temp --      Temp src --      SpO2 01/23/19 1023 91 %     Weight 01/23/19 1025 210 lb (95.3 kg)     Height 01/23/19 1025 5\' 7"  (1.702 m)     Head Circumference --      Peak Flow --      Pain Score 01/23/19 1025 0     Pain Loc --      Pain Edu? --      Excl. in [place]? --     Constitutional: Alert and oriented.  Diaphoretic. Eyes: Conjunctivae are normal. EOMI. Head: Atraumatic. Nose: No congestion/rhinnorhea. Mouth/Throat: Mucous membranes are moist.   Neck: No stridor. Trachea  Midline. FROM Cardiovascular: Tachycardia, regular rhythm. Grossly normal heart sounds.  Good peripheral circulation. Respiratory: Patient 89% on room air.  Placed on 2 L.  Mild increased work of breathing. Gastrointestinal: Soft and nontender. No distention. No abdominal bruits.  Musculoskeletal: No lower extremity tenderness nor edema.  No joint effusions. Neurologic:  Normal speech and language. No gross focal neurologic deficits are appreciated.  Skin:  Skin is warm, dry and intact. No rash noted. Psychiatric: Mood and affect are normal. Speech and behavior are normal. GU: Deferred   ____________________________________________   LABS (all labs ordered are listed, but only abnormal results are displayed)  Labs Reviewed  GLUCOSE, CAPILLARY - Abnormal; Notable for the following components:      Result Value   Glucose-Capillary 123 (*)    All other components within normal limits  COMPREHENSIVE METABOLIC PANEL - Abnormal; Notable for the following components:   Chloride 97 (*)    Glucose, Bld 112 (*)    Calcium 8.5 (*)    All other components within normal limits  FIBRIN DERIVATIVES D-DIMER ([facility] ONLY) - Abnormal; Notable for the following components:   Fibrin derivatives D-dimer [facility]) 664.37 (*)    All other components within normal limits  FERRITIN - Abnormal; Notable for the following components:   Ferritin 538 (*)    All other components within normal limits  FIBRINOGEN - Abnormal; Notable for the following components:   Fibrinogen >750 (*)    All other components within normal limits  CULTURE, BLOOD (ROUTINE X 2)  CULTURE, BLOOD (ROUTINE X 2)  LACTIC ACID, PLASMA  CBC WITH DIFFERENTIAL/PLATELET  PROCALCITONIN  LACTATE DEHYDROGENASE  TRIGLYCERIDES  C-REACTIVE PROTEIN  HIV ANTIBODY (ROUTINE TESTING W REFLEX)  CBC  CREATININE, SERUM  ABO/RH  TROPONIN I (HIGH SENSITIVITY)  TROPONIN I (HIGH SENSITIVITY)  TROPONIN I (HIGH SENSITIVITY)  TROPONIN I (HIGH SENSITIVITY)     ____________________________________________   ED ECG REPORT I, Vanessa Weston, the attending physician, personally viewed and interpreted this ECG.  EKG is sinus tachycardia rate of 104, no ST ovation, no T wave inversions, normal intervals ____________________________________________  RADIOLOGY Robert Bellow, personally viewed and evaluated these images (plain radiographs) as part of my medical decision making, as well as reviewing the written report by the radiologist.  ED MD interpretation: Chest x-ray some patchy opacities in the bases most likely consistent with coronavirus  Official radiology report(s): Dg Chest Port 1 View  Result Date: 01/23/2019 CLINICAL DATA:  Chest tightness. Patient diagnosed with COVID-19 January 21, 2019 EXAM: PORTABLE CHEST 1 VIEW COMPARISON:  None. FINDINGS: No pneumothorax. The cardiomediastinal silhouette is unremarkable. There is patchy opacity in the left base. There may be mild opacity in the medial right lung base.  No other acute abnormalities. IMPRESSION: Patchy opacity in the left base and possible mild opacity in the medial right lung base. Recommend follow-up to resolution. No pneumothorax. No other acute abnormalities. Electronically Signed   By: Gerome Samavid  Williams III M.D   On: 01/23/2019 11:11    ____________________________________________   PROCEDURES  Procedure(s) performed (including Critical Care):  .Critical Care Performed by: Concha SeFunke, Elta Angell E, MD Authorized by: Concha SeFunke, Dorie Ohms E, MD   Critical care provider statement:    Critical care time (minutes):  30   Critical care was necessary to treat or prevent imminent or life-threatening deterioration of the following conditions:  Respiratory failure   Critical care was time spent personally by me on the following activities:  Discussions with consultants, evaluation of patient's response to treatment, examination of patient, ordering and performing treatments and interventions, ordering  and review of laboratory studies, ordering and review of radiographic studies, pulse oximetry, re-evaluation of patient's condition, obtaining history from patient or surrogate and review of old charts     ____________________________________________   INITIAL IMPRESSION / ASSESSMENT AND PLAN / ED COURSE  Devin Humphrey was evaluated in Emergency Department on 01/23/2019 for the symptoms described in the history of present illness. He was evaluated in the context of the global COVID-19 pandemic, which necessitated consideration that the patient might be at risk for infection with the SARS-CoV-2 virus that causes COVID-19. Institutional protocols and algorithms that pertain to the evaluation of patients at risk for COVID-19 are in a state of rapid change based on information released by regulatory bodies including the CDC and federal and state organizations. These policies and algorithms were followed during the patient's care in the ED.     Patient presents with known coronavirus with new oxygen requirement as well as syncopal episode.  Patient did have a prodromal periods I think is more likely from dehydration given his tachycardia.  We will give a liter of fluids and reassess patient.  Will get labs evaluate for Electra abnormalities, AKI.  Lower suspicion for pulmonary embolism at this time.  Will get cardiac markers to evaluate for ACS.  Cardiac markers are reassuring.  D-dimer slightly elevated most likely secondary to coronavirus.  Again at this time do not think CT imaging is necessary.  Procalcitonin was negative  Given the syncopal episode will discuss w/ hospital team for admission as well as patient is on 2 L of oxygen.   ____________________________________________   FINAL CLINICAL IMPRESSION(S) / ED DIAGNOSES   Final diagnoses:  COVID-19  Syncope and collapse  Acute respiratory failure with hypoxia (HCC)      MEDICATIONS GIVEN DURING THIS VISIT:  Medications    buPROPion (WELLBUTRIN XL) 24 hr tablet 300 mg (has no administration in time range)  enoxaparin (LOVENOX) injection 40 mg (has no administration in time range)  Ipratropium-Albuterol (COMBIVENT) respimat 1 puff (has no administration in time range)  guaiFENesin-dextromethorphan (ROBITUSSIN DM) 100-10 MG/5ML syrup 10 mL (has no administration in time range)  chlorpheniramine-HYDROcodone (TUSSIONEX) 10-8 MG/5ML suspension 5 mL (has no administration in time range)  vitamin C (ASCORBIC ACID) tablet 500 mg (has no administration in time range)  zinc sulfate capsule 220 mg (has no administration in time range)  insulin aspart (novoLOG) injection 0-15 Units (has no administration in time range)  famotidine (PEPCID) tablet 20 mg (has no administration in time range)  acetaminophen (TYLENOL) tablet 650 mg (has no administration in time range)  senna-docusate (Senokot-S) tablet 1 tablet (has no administration in time range)  ondansetron (ZOFRAN) tablet 4 mg (has no administration in time range)    Or  ondansetron (ZOFRAN) injection 4 mg (has no administration in time range)  hydrALAZINE (APRESOLINE) injection 10 mg (has no administration in time range)  methylPREDNISolone sodium succinate (SOLU-MEDROL) 40 mg/mL injection 40 mg (has no administration in time range)  0.9 %  sodium chloride infusion (has no administration in time range)  acetaminophen (TYLENOL) tablet 1,000 mg (1,000 mg Oral Given 01/23/19 1304)  sodium chloride 0.9 % bolus 500 mL (500 mLs Intravenous New Bag/Given 01/23/19 1303)     ED Discharge Orders    None       Note:  This document was prepared using Dragon voice recognition software and may include unintentional dictation errors.   Concha Se, MD 01/23/19 1329

## 2019-01-23 NOTE — ED Triage Notes (Signed)
COVID positive on thanksgiving day. C/o chest tightness, cough, and lethargic

## 2019-01-23 NOTE — Progress Notes (Signed)
Remdesivir - Pharmacy Brief Note   O:  ALT: 23 CXR: patchy opacities SpO2: 95% on RA   A/P:  Remdesivir 200 mg IVPB once followed by 100 mg IVPB daily x 4 days.   Paulina Fusi, PharmD, BCPS 01/23/2019 1:42 PM

## 2019-01-23 NOTE — H&P (Signed)
History and Physical    Devin Humphrey INO:[id number] DOB: 1970/03/29 DOA: 01/23/2019  PCP: Dale Tichigan, MD Patient coming from:   Chief Complaint: Weakness  HPI: Devin Humphrey is a 48 y.o. male with medical history significant of HLD, GERD diagnosed of COVID-19 pneumonia 4 days ago presented to the hospital with complaints of chest discomfort, fatigue and nonproductive cough.  Prior to his diagnosis he has had flu type symptoms for the 7 days therefore went to an urgent care where he was tested for COVID-19.  He was discharged home with antipyretics, self quarantine and routine hydration instructions.  Despite of going home he continued to feel the same way therefore presented to the hospital. While waiting in the ER in the wheelchair he became very lightheaded, slightly diaphoretic and lost consciousness for couple of minutes.  Denies any head trauma.  During his evaluation his EKG was unremarkable.  Cardiac enzymes were negative.  On room air he was saturating 89% with improvement to 95% on 2 L.  Chest x-ray showed left base and right middle lobe infiltrate.  Given failure of outpatient treatment with supportive care for COVID-19 pneumonia and syncopal episode he was admitted to the hospital.   Review of Systems: As per HPI otherwise 10 point review of systems negative.  Review of Systems Otherwise negative except as per HPI, including: General: Denies fever, chills, night sweats or unintended weight loss. Resp: Denies cough, wheezing Cardiac: Denies chest pain, palpitations, orthopnea, paroxysmal nocturnal dyspnea. GI: Denies abdominal pain, nausea, vomiting, diarrhea or constipation GU: Denies dysuria, frequency, hesitancy or incontinence MS: Denies muscle aches, joint pain or swelling Neuro: Denies headache, neurologic deficits (focal weakness, numbness, tingling), abnormal gait Psych: Denies anxiety, depression, SI/HI/AVH Skin: Denies new rashes or lesions ID: Denies sick  contacts, exotic exposures, travel  Past Medical History:  Diagnosis Date  . Degenerative disc disease    cervical disc disease  . GERD (gastroesophageal reflux disease)     Past Surgical History:  Procedure Laterality Date  . COLONOSCOPY    . UPPER GI ENDOSCOPY      SOCIAL HISTORY:  reports that he has never smoked. He has never used smokeless tobacco. He reports current alcohol use. He reports that he does not use drugs.  No Known Allergies  FAMILY HISTORY: Family History  Problem Relation Age of Onset  . Hyperlipidemia Father   . Heart disease Father        s/p CABG and stent  . Stroke Father   . Hypertension Father   . Colon cancer Neg Hx   . Prostate cancer Neg Hx      Prior to Admission medications   Medication Sig Start Date End Date Taking? Authorizing Provider  acetaminophen (TYLENOL) 325 MG tablet Take 650 mg by mouth every 6 (six) hours as needed.   Yes [provider]  guaiFENesin (MUCINEX) 600 MG 12 hr tablet Take 600 mg by mouth 2 (two) times daily.   Yes [provider]  pseudoephedrine (SUDAFED) 120 MG 12 hr tablet Take 120 mg by mouth 2 (two) times daily.   Yes [provider]    Physical Exam: Vitals:   01/23/19 1300 01/23/19 1330 01/23/19 1430 01/23/19 1500  BP: 126/73 (!) 118/93 114/71 111/66  Pulse: (!) 102 (!) 104 (!) 102 (!) 102  Resp: (!) 32 (!) 28 (!) 23 (!) 24  Temp:      TempSrc:      SpO2: 95% 94% 92% 92%  Weight:  Height:          Constitutional: NAD, calm, comfortable Eyes: PERRL, lids and conjunctivae normal ENMT: Mucous membranes are DRY Posterior pharynx clear of any exudate or lesions.Normal dentition.  Neck: normal, supple, no masses, no thyromegaly Respiratory: Slight bibasilar rhonchi Cardiovascular: Regular rate and rhythm, no murmurs / rubs / gallops. No extremity edema. 2+ pedal pulses. No carotid bruits.  Abdomen: no tenderness, no masses palpated. No hepatosplenomegaly. Bowel sounds  positive.  Musculoskeletal: no clubbing / cyanosis. No joint deformity upper and lower extremities. Good ROM, no contractures. Normal muscle tone.  Skin: no rashes, lesions, ulcers. No induration Neurologic: CN 2-12 grossly intact. Sensation intact, DTR normal. Strength 5/5 in all 4.  Psychiatric: Normal judgment and insight. Alert and oriented x 3. Normal mood.     Labs on Admission: I have personally reviewed following labs and imaging studies  CBC: Recent Labs  Lab 01/23/19 1146  WBC 7.8  NEUTROABS 6.5  HGB 14.6  HCT 42.3  MCV 82.5  PLT 412   Basic Metabolic Panel: Recent Labs  Lab 01/23/19 1146  NA 136  K 3.8  CL 97*  CO2 25  GLUCOSE 112*  BUN 10  CREATININE 1.05  CALCIUM 8.5*   GFR: Estimated Creatinine Clearance: 95.7 mL/min (by C-G formula based on SCr of 1.05 mg/dL). Liver Function Tests: Recent Labs  Lab 01/23/19 1146  AST 29  ALT 23  ALKPHOS 68  BILITOT 0.5  PROT 7.7  ALBUMIN 3.8   No results for input(s): LIPASE, AMYLASE in the last 168 hours. No results for input(s): AMMONIA in the last 168 hours. Coagulation Profile: No results for input(s): INR, PROTIME in the last 168 hours. Cardiac Enzymes: No results for input(s): CKTOTAL, CKMB, CKMBINDEX, TROPONINI in the last 168 hours. BNP (last 3 results) No results for input(s): PROBNP in the last 8760 hours. HbA1C: No results for input(s): HGBA1C in the last 72 hours. CBG: Recent Labs  Lab 01/23/19 1029  GLUCAP 123*   Lipid Profile: Recent Labs    01/23/19 1146  TRIG 103   Thyroid Function Tests: No results for input(s): TSH, T4TOTAL, FREET4, T3FREE, THYROIDAB in the last 72 hours. Anemia Panel: Recent Labs    01/23/19 1146  FERRITIN 538*   Urine analysis:    Component Value Date/Time   COLORURINE YELLOW 11/02/2014 0641   APPEARANCEUR HAZY (A) 11/02/2014 0641   LABSPEC 1.018 11/02/2014 0641   PHURINE 8.5 (H) 11/02/2014 0641   GLUCOSEU NEGATIVE 11/02/2014 0641   HGBUR NEGATIVE  11/02/2014 0641   BILIRUBINUR NEGATIVE 11/02/2014 0641   KETONESUR NEGATIVE 11/02/2014 0641   PROTEINUR NEGATIVE 11/02/2014 0641   UROBILINOGEN 0.2 11/02/2014 0641   NITRITE NEGATIVE 11/02/2014 0641   LEUKOCYTESUR NEGATIVE 11/02/2014 0641   Sepsis Labs: !!!!!!!!!!!!!!!!!!!!!!!!!!!!!!!!!!!!!!!!!!!! @LABRCNTIP (procalcitonin:4,lacticidven:4) )No results found for this or any previous visit (from the past 240 hour(s)).   Radiological Exams on Admission: Dg Chest Port 1 View  Result Date: 01/23/2019 CLINICAL DATA:  Chest tightness. Patient diagnosed with COVID-19 January 21, 2019 EXAM: PORTABLE CHEST 1 VIEW COMPARISON:  None. FINDINGS: No pneumothorax. The cardiomediastinal silhouette is unremarkable. There is patchy opacity in the left base. There may be mild opacity in the medial right lung base. No other acute abnormalities. IMPRESSION: Patchy opacity in the left base and possible mild opacity in the medial right lung base. Recommend follow-up to resolution. No pneumothorax. No other acute abnormalities. Electronically Signed   By: Dorise Bullion III M.D   On: 01/23/2019 11:11  All images have been reviewed by me personally.    Assessment/Plan Principal Problem:   Acute respiratory disease due to COVID-19 virus Active Problems:   GERD (gastroesophageal reflux disease)   Cervical disc disease   Anxiety   Syncope    Acute respiratory distress with hypoxia due to COVID-19 Acute mild hypoxia, 89% on room air -Oxygen levels-89% room air, 2 L nasal cannula 95% -Remdesivir-day 1 -IV Solu-Medrol -Routine: Labs have been reviewed including ferritin, LDH, CRP, d-dimer, fibrinogen.  Will need to trend this lab daily. -Vitamin C & Zinc. Prone >16hrs/day.  -procalcitonin-negative -Chest x-ray-left lung and right medial lung opacity -Supportive care-antitussive, inhalers, I-S/flutter -CODE STATUS confirmed  Syncope, without head trauma Moderate dehydration -Secondary to  dehydration.  No head trauma.  IV fluids 100 cc/h.  Can do orthostatics tomorrow.  If no improvement in tachycardia with fluids, consider CTA chest to rule out PE  DVT prophylaxis: Lovenox Code Status: Full code Family Communication: None Disposition Plan: None to be determined Consults called:  Admission status: Inpatient admission to [facility] for COVID-19 pneumonia causing hypoxia   Time Spent: 65 minutes.  >50% of the time was devoted to discussing the patients care, assessment, plan and disposition with other care givers along with counseling the patient about the risks and benefits of treatment.    Jonovan Boedecker Joline Maxcyhirag Kamiyah Kindel MD [facility]  If 7PM-7AM, please contact night-coverage   01/23/2019, 4:26 PM

## 2019-01-23 NOTE — ED Notes (Signed)
RT has placed pt on high flow and pt is tolerating well.

## 2019-01-23 NOTE — ED Notes (Signed)
Pt reports feeling diaphoretic

## 2019-01-23 NOTE — ED Notes (Signed)
Ouma, NP would like to trial pt on high flow [place] oxygen. RT notified.

## 2019-01-23 NOTE — ED Notes (Addendum)
Pt assisted to toilet. SpO2 dropped to 89% without O2, assisted back to bed and placed on 2L/min. SpO2 returned to 94%

## 2019-01-23 NOTE — ED Notes (Signed)
SpO2 down to 90-91%, O2 increased to 3L/min

## 2019-01-24 ENCOUNTER — Other Ambulatory Visit: Payer: Self-pay

## 2019-01-24 ENCOUNTER — Inpatient Hospital Stay (HOSPITAL_COMMUNITY)
Admission: AD | Admit: 2019-01-24 | Discharge: 2019-01-27 | DRG: 177 | Disposition: A | Discharge status: Home / Self Care | Payer: PRIVATE HEALTH INSURANCE | Source: Other Acute Inpatient Hospital | Attending: Internal Medicine | Admitting: Internal Medicine

## 2019-01-24 ENCOUNTER — Encounter (HOSPITAL_COMMUNITY): Payer: Self-pay

## 2019-01-24 DIAGNOSIS — E669 Obesity, unspecified: Secondary | ICD-10-CM | POA: Diagnosis present

## 2019-01-24 DIAGNOSIS — M5090 Cervical disc disorder, unspecified, unspecified cervical region: Secondary | ICD-10-CM

## 2019-01-24 DIAGNOSIS — J1282 Pneumonia due to coronavirus disease 2019: Secondary | ICD-10-CM | POA: Diagnosis present

## 2019-01-24 DIAGNOSIS — E7800 Pure hypercholesterolemia, unspecified: Secondary | ICD-10-CM | POA: Diagnosis not present

## 2019-01-24 DIAGNOSIS — J9601 Acute respiratory failure with hypoxia: Secondary | ICD-10-CM | POA: Diagnosis present

## 2019-01-24 DIAGNOSIS — R55 Syncope and collapse: Secondary | ICD-10-CM | POA: Diagnosis present

## 2019-01-24 DIAGNOSIS — E785 Hyperlipidemia, unspecified: Secondary | ICD-10-CM | POA: Diagnosis present

## 2019-01-24 DIAGNOSIS — M1990 Unspecified osteoarthritis, unspecified site: Secondary | ICD-10-CM | POA: Diagnosis present

## 2019-01-24 DIAGNOSIS — R0902 Hypoxemia: Secondary | ICD-10-CM | POA: Diagnosis present

## 2019-01-24 DIAGNOSIS — F419 Anxiety disorder, unspecified: Secondary | ICD-10-CM | POA: Diagnosis present

## 2019-01-24 DIAGNOSIS — Z6831 Body mass index (BMI) 31.0-31.9, adult: Secondary | ICD-10-CM | POA: Diagnosis not present

## 2019-01-24 DIAGNOSIS — J069 Acute upper respiratory infection, unspecified: Secondary | ICD-10-CM

## 2019-01-24 DIAGNOSIS — U071 COVID-19: Secondary | ICD-10-CM | POA: Diagnosis present

## 2019-01-24 DIAGNOSIS — J1289 Other viral pneumonia: Secondary | ICD-10-CM | POA: Diagnosis not present

## 2019-01-24 LAB — CBC WITH DIFFERENTIAL/PLATELET
Abs Immature Granulocytes: 0.04 10*3/uL (ref 0.00–0.07)
Basophils Absolute: 0 10*3/uL (ref 0.0–0.1)
Basophils Relative: 0 %
Eosinophils Absolute: 0 10*3/uL (ref 0.0–0.5)
Eosinophils Relative: 0 %
HCT: 41.5 % (ref 39.0–52.0)
Hemoglobin: 13.8 g/dL (ref 13.0–17.0)
Immature Granulocytes: 1 %
Lymphocytes Relative: 16 %
Lymphs Abs: 0.9 10*3/uL (ref 0.7–4.0)
MCH: 28.5 pg (ref 26.0–34.0)
MCHC: 33.3 g/dL (ref 30.0–36.0)
MCV: 85.7 fL (ref 80.0–100.0)
Monocytes Absolute: 0.1 10*3/uL (ref 0.1–1.0)
Monocytes Relative: 2 %
Neutro Abs: 4.6 10*3/uL (ref 1.7–7.7)
Neutrophils Relative %: 81 %
Platelets: 211 10*3/uL (ref 150–400)
RBC: 4.84 MIL/uL (ref 4.22–5.81)
RDW: 13.1 % (ref 11.5–15.5)
WBC: 5.7 10*3/uL (ref 4.0–10.5)
nRBC: 0 % (ref 0.0–0.2)

## 2019-01-24 LAB — COMPREHENSIVE METABOLIC PANEL
ALT: 28 U/L (ref 0–44)
AST: 32 U/L (ref 15–41)
Albumin: 3.8 g/dL (ref 3.5–5.0)
Alkaline Phosphatase: 65 U/L (ref 38–126)
Anion gap: 13 (ref 5–15)
BUN: 13 mg/dL (ref 6–20)
CO2: 23 mmol/L (ref 22–32)
Calcium: 8.2 mg/dL — ABNORMAL LOW (ref 8.9–10.3)
Chloride: 101 mmol/L (ref 98–111)
Creatinine, Ser: 0.97 mg/dL (ref 0.61–1.24)
GFR calc Af Amer: >60 mL/min (ref >60)
GFR calc non Af Amer: >60 mL/min (ref >60)
Glucose, Bld: 146 mg/dL — ABNORMAL HIGH (ref 70–99)
Potassium: 3.9 mmol/L (ref 3.5–5.1)
Sodium: 137 mmol/L (ref 135–145)
Total Bilirubin: 0.7 mg/dL (ref 0.3–1.2)
Total Protein: 8 g/dL (ref 6.5–8.1)

## 2019-01-24 LAB — CREATININE, SERUM
Creatinine, Ser: 0.71 mg/dL (ref 0.61–1.24)
GFR calc Af Amer: >60 mL/min (ref >60)
GFR calc non Af Amer: >60 mL/min (ref >60)

## 2019-01-24 LAB — TYPE AND SCREEN
ABO/RH(D): B POS
Antibody Screen: NEGATIVE

## 2019-01-24 LAB — GLUCOSE, CAPILLARY
Glucose-Capillary: 126 mg/dL — ABNORMAL HIGH (ref 70–99)
Glucose-Capillary: 128 mg/dL — ABNORMAL HIGH (ref 70–99)
Glucose-Capillary: 130 mg/dL — ABNORMAL HIGH (ref 70–99)
Glucose-Capillary: 149 mg/dL — ABNORMAL HIGH (ref 70–99)

## 2019-01-24 LAB — MAGNESIUM
Magnesium: 2.3 mg/dL (ref 1.7–2.4)
Magnesium: 2.2 mg/dL (ref 1.7–2.4)

## 2019-01-24 LAB — FERRITIN: Ferritin: 638 ng/mL — ABNORMAL HIGH (ref 24–336)

## 2019-01-24 LAB — CBC
HCT: 40.3 % (ref 39.0–52.0)
Hemoglobin: 13.6 g/dL (ref 13.0–17.0)
MCH: 29.2 pg (ref 26.0–34.0)
MCHC: 33.7 g/dL (ref 30.0–36.0)
MCV: 86.7 fL (ref 80.0–100.0)
Platelets: 229 10*3/uL (ref 150–400)
RBC: 4.65 MIL/uL (ref 4.22–5.81)
RDW: 13.2 % (ref 11.5–15.5)
WBC: 10 10*3/uL (ref 4.0–10.5)
nRBC: 0 % (ref 0.0–0.2)

## 2019-01-24 LAB — PROCALCITONIN
Procalcitonin: 0.22 ng/mL
Procalcitonin: 0.18 ng/mL

## 2019-01-24 LAB — FIBRIN DERIVATIVES D-DIMER (ARMC ONLY): Fibrin derivatives D-dimer (ARMC): 838.05 ng/mL (FEU) — ABNORMAL HIGH (ref 0.00–499.00)

## 2019-01-24 LAB — ABO/RH: ABO/RH(D): B POS

## 2019-01-24 LAB — PHOSPHORUS
Phosphorus: 3.5 mg/dL (ref 2.5–4.6)
Phosphorus: 2.3 mg/dL — ABNORMAL LOW (ref 2.5–4.6)

## 2019-01-24 LAB — C-REACTIVE PROTEIN: CRP: 25.1 mg/dL — ABNORMAL HIGH (ref <1.0)

## 2019-01-24 LAB — LACTATE DEHYDROGENASE: LDH: 217 U/L — ABNORMAL HIGH (ref 98–192)

## 2019-01-24 LAB — BRAIN NATRIURETIC PEPTIDE: B Natriuretic Peptide: 60 pg/mL (ref 0.0–100.0)

## 2019-01-24 LAB — D-DIMER, QUANTITATIVE: D-Dimer, Quant: 0.8 ug/mL-FEU — ABNORMAL HIGH (ref 0.00–0.50)

## 2019-01-24 LAB — HIV ANTIBODY (ROUTINE TESTING W REFLEX): HIV Screen 4th Generation wRfx: NONREACTIVE

## 2019-01-24 MED ORDER — ENOXAPARIN SODIUM 40 MG/0.4ML ~~LOC~~ SOLN
40.0000 mg | SUBCUTANEOUS | Status: DC
  Administered 2019-01-24 – 2019-01-26 (×3): 40 mg via SUBCUTANEOUS
  Filled 2019-01-24 (×3): qty 0.4

## 2019-01-24 MED ORDER — MORPHINE SULFATE (PF) 2 MG/ML IV SOLN: 1.0000 mg | INTRAVENOUS | Status: DC | PRN

## 2019-01-24 MED ORDER — ASCORBIC ACID 500 MG PO TABS: 500.0000 mg | ORAL_TABLET | Freq: Every day | ORAL | Status: DC

## 2019-01-24 MED ORDER — TOCILIZUMAB 400 MG/20ML IV SOLN
8.0000 mg/kg | Freq: Once | INTRAVENOUS | Status: AC
  Administered 2019-01-24: 762 mg via INTRAVENOUS
  Filled 2019-01-24: qty 38.1

## 2019-01-24 MED ORDER — METHYLPREDNISOLONE SODIUM SUCC 40 MG IJ SOLR: 40.0000 mg | Freq: Three times a day (TID) | INTRAMUSCULAR | 0 refills | Status: DC

## 2019-01-24 MED ORDER — ONDANSETRON HCL 4 MG/2ML IJ SOLN: 4.0000 mg | Freq: Four times a day (QID) | INTRAMUSCULAR | Status: DC | PRN

## 2019-01-24 MED ORDER — ONDANSETRON HCL 4 MG PO TABS: 4.0000 mg | ORAL_TABLET | Freq: Four times a day (QID) | ORAL | Status: DC | PRN

## 2019-01-24 MED ORDER — DEXAMETHASONE SODIUM PHOSPHATE 10 MG/ML IJ SOLN
6.0000 mg | INTRAMUSCULAR | Status: DC
  Administered 2019-01-24 – 2019-01-26 (×3): 6 mg via INTRAVENOUS
  Filled 2019-01-24 (×3): qty 1

## 2019-01-24 MED ORDER — IPRATROPIUM-ALBUTEROL 20-100 MCG/ACT IN AERS
1.0000 | INHALATION_SPRAY | Freq: Four times a day (QID) | RESPIRATORY_TRACT | Status: DC
  Administered 2019-01-24 – 2019-01-25 (×2): 1 via RESPIRATORY_TRACT
  Filled 2019-01-24: qty 4

## 2019-01-24 MED ORDER — VITAMIN C 500 MG PO TABS
500.0000 mg | ORAL_TABLET | Freq: Every day | ORAL | Status: DC
  Administered 2019-01-24 – 2019-01-27 (×4): 500 mg via ORAL
  Filled 2019-01-24 (×5): qty 1

## 2019-01-24 MED ORDER — ZINC SULFATE 220 (50 ZN) MG PO CAPS: 220.0000 mg | ORAL_CAPSULE | Freq: Every day | ORAL | Status: DC

## 2019-01-24 MED ORDER — SODIUM CHLORIDE 0.9 % IV SOLN
100.0000 mg | INTRAVENOUS | Status: AC
  Administered 2019-01-25 – 2019-01-27 (×3): 100 mg via INTRAVENOUS
  Filled 2019-01-24 (×3): qty 100

## 2019-01-24 MED ORDER — ZINC SULFATE 220 (50 ZN) MG PO CAPS
220.0000 mg | ORAL_CAPSULE | Freq: Every day | ORAL | Status: DC
  Administered 2019-01-24 – 2019-01-27 (×4): 220 mg via ORAL
  Filled 2019-01-24 (×4): qty 1

## 2019-01-24 MED ORDER — ACETAMINOPHEN 325 MG PO TABS
650.0000 mg | ORAL_TABLET | Freq: Four times a day (QID) | ORAL | Status: DC | PRN
  Administered 2019-01-24: 650 mg via ORAL
  Filled 2019-01-24: qty 2

## 2019-01-24 NOTE — ED Notes (Signed)
Denies SOB at rest, states does get SOB with any exertion including moving in bed.

## 2019-01-24 NOTE — ED Notes (Signed)
RN unable to obtain pt's consent for transfer prior to pt departing ED with [facility]. Pt verbalized understanding of need for transport to [facility] for continued care.

## 2019-01-24 NOTE — ED Notes (Signed)
EMTALA reviewed. 

## 2019-01-24 NOTE — ED Notes (Signed)
Pt reports feeling improved with high flow nasal cannula. Pt denies needs currently. Pt remains tachypnic, but is able to speak in full sentences and does not appear in acute distress.

## 2019-01-24 NOTE — H&P (Signed)
PROGRESS NOTE    Devin Humphrey  UEA:[id number] DOB: 08-15-1970 DOA: 01/24/2019 PCP: Dale DurhamScott, Charlene, MD   Brief Narrative:  48 y.o. WM PMHx Anxiety DJD, GERD, syncope,  diagnosed of COVID-19 pneumonia 4 days ago presented to the hospital with complaints of chest discomfort, fatigue and nonproductive cough.  Prior to his diagnosis he has had flu type symptoms for the 7 days therefore went to an urgent care where he was tested for COVID-19.  He was discharged home with antipyretics, self quarantine and routine hydration instructions.  Despite of going home he continued to feel the same way therefore presented to the hospital. While waiting in the ER in the wheelchair he became very lightheaded, slightly diaphoretic and lost consciousness for couple of minutes.  Denies any head trauma.  During his evaluation his EKG was unremarkable.  Cardiac enzymes were negative.  On room air he was saturating 89% with improvement to 95% on 2 L.  Chest x-ray showed left base and right middle lobe infiltrate.  Given failure of outpatient treatment with supportive care for COVID-19 pneumonia and syncopal episode he was admitted to the hospital.    Subjective: A/O x4, negative tobacco use, negative previous immunosuppressant use, negative hepatitis.  Counseled patient on risk and benefits of Actemra and patient stated he agreed to the use of Actemra if needed.   Assessment & Plan:   Active Problems:   Cervical disc disease   Anxiety   Hypercholesterolemia   Syncope   Acute respiratory failure with hypoxia (HCC)   Pneumonia due to COVID-19 virus   Covid pneumonia/acute respiratory failure with hypoxia COVID-19 Labs  Recent Labs    01/23/19 1146 01/24/19 0424  FERRITIN 538* 638*  LDH 167  --   CRP 21.9* 25.1*    No results found for: SARSCOV2NAA -Decadron 6 mg daily -Remdesivir per pharmacy protocol -Actemra x1 dose -Flutter valve -incentive spirometer -Combivent -Vitamins Covid  protocol -Titrate O2 to maintain SPO2> 88% -Prone patient for 16 hours/day; if patient cannot tolerate prone 2 to 3 hours per shift -Morphine PRN  Anxiety -Currently stable  Syncope -Patient has been counseled not to get out of bed without assistance   DJD -Tylenol PRN -MorphinePRN  HLD -Lipid panel pending     DVT prophylaxis: Lovenox Code Status: Full Family Communication:  Disposition Plan: TBD   Consultants:    Procedures/Significant Events:     I have personally reviewed and interpreted all radiology studies and my findings are as above.  VENTILATOR SETTINGS: HFNC 11/30 Flow; 20 L/min FiO2; 80% SPO2; 98%     Cultures 11/30 SARS coronavirus pending 11/30 respiratory virus panel pending 11/30 blood pending  Antimicrobials: Anti-infectives (From admission, onward)   Start     Stop   01/25/19 1000  remdesivir 100 mg in sodium chloride 0.9 % 250 mL IVPB     01/28/19 0959       Devices    LINES / TUBES:      Continuous Infusions:   Objective: Vitals:   01/24/19 1634  BP: 121/66  Pulse: 89  Resp: (!) 29  Temp: 97.7 F (36.5 C)  TempSrc: Oral  SpO2: 96%   No intake or output data in the 24 hours ending 01/24/19 1647 There were no vitals filed for this visit.  Examination:  General: A/O x4, positive acute respiratory distress Eyes: negative scleral hemorrhage, negative anisocoria, negative icterus ENT: Negative Runny nose, negative gingival bleeding, Neck:  Negative scars, masses, torticollis, lymphadenopathy, JVD Lungs: Tachypneic clear  to auscultation bilaterally without wheezes or crackles Cardiovascular: Regular rate and rhythm without murmur gallop or rub normal S1 and S2 Abdomen: negative abdominal pain, nondistended, positive soft, bowel sounds, no rebound, no ascites, no appreciable mass Extremities: No significant cyanosis, clubbing, or edema bilateral lower extremities Skin: Negative rashes, lesions, ulcers  Psychiatric:  Negative depression, negative anxiety, negative fatigue, negative mania  Central nervous system:  Cranial nerves II through XII intact, tongue/uvula midline, all extremities muscle strength 5/5, sensation intact throughout,  negative dysarthria, negative expressive aphasia, negative receptive aphasia.  .     Data Reviewed: Care during the described time interval was provided by me .  I have reviewed this patient's available data, including medical history, events of note, physical examination, and all test results as part of my evaluation.   CBC: Recent Labs  Lab 01/23/19 1146 01/24/19 0424  WBC 7.8 5.7  NEUTROABS 6.5 4.6  HGB 14.6 13.8  HCT 42.3 41.5  MCV 82.5 85.7  PLT 185 211   Basic Metabolic Panel: Recent Labs  Lab 01/23/19 1146 01/24/19 0424  NA 136 137  K 3.8 3.9  CL 97* 101  CO2 25 23  GLUCOSE 112* 146*  BUN 10 13  CREATININE 1.05 0.97  CALCIUM 8.5* 8.2*  MG  --  2.3  PHOS  --  3.5   GFR: Estimated Creatinine Clearance: 103.6 mL/min (by C-G formula based on SCr of 0.97 mg/dL). Liver Function Tests: Recent Labs  Lab 01/23/19 1146 01/24/19 0424  AST 29 32  ALT 23 28  ALKPHOS 68 65  BILITOT 0.5 0.7  PROT 7.7 8.0  ALBUMIN 3.8 3.8   No results for input(s): LIPASE, AMYLASE in the last 168 hours. No results for input(s): AMMONIA in the last 168 hours. Coagulation Profile: No results for input(s): INR, PROTIME in the last 168 hours. Cardiac Enzymes: No results for input(s): CKTOTAL, CKMB, CKMBINDEX, TROPONINI in the last 168 hours. BNP (last 3 results) No results for input(s): PROBNP in the last 8760 hours. HbA1C: No results for input(s): HGBA1C in the last 72 hours. CBG: Recent Labs  Lab 01/23/19 1916 01/24/19 0002 01/24/19 0126 01/24/19 0409 01/24/19 0816  GLUCAP 149* 130* 149* 128* 126*   Lipid Profile: Recent Labs    01/23/19 1146  TRIG 103   Thyroid Function Tests: No results for input(s): TSH, T4TOTAL, FREET4, T3FREE,  THYROIDAB in the last 72 hours. Anemia Panel: Recent Labs    01/23/19 1146 01/24/19 0424  FERRITIN 538* 638*   Urine analysis:    Component Value Date/Time   COLORURINE YELLOW 11/02/2014 0641   APPEARANCEUR HAZY (A) 11/02/2014 0641   LABSPEC 1.018 11/02/2014 0641   PHURINE 8.5 (H) 11/02/2014 0641   GLUCOSEU NEGATIVE 11/02/2014 0641   HGBUR NEGATIVE 11/02/2014 0641   BILIRUBINUR NEGATIVE 11/02/2014 0641   KETONESUR NEGATIVE 11/02/2014 0641   PROTEINUR NEGATIVE 11/02/2014 0641   UROBILINOGEN 0.2 11/02/2014 0641   NITRITE NEGATIVE 11/02/2014 0641   LEUKOCYTESUR NEGATIVE 11/02/2014 0641   Sepsis Labs: @LABRCNTIP (procalcitonin:4,lacticidven:4)  ) Recent Results (from the past 240 hour(s))  Blood Culture (routine x 2)     Status: None (Preliminary result)   Collection Time: 01/23/19 11:46 AM   Specimen: BLOOD  Result Value Ref Range Status   Specimen Description BLOOD LEFT ANTECUBITAL  Final   Special Requests   Final    BOTTLES DRAWN AEROBIC AND ANAEROBIC Blood Culture adequate volume   Culture   Final    NO GROWTH < 24 HOURS Performed  at [facility], [address], [place], [place] [address]    Report Status PENDING  Incomplete  Blood Culture (routine x 2)     Status: None (Preliminary result)   Collection Time: 01/23/19 11:46 AM   Specimen: BLOOD  Result Value Ref Range Status   Specimen Description BLOOD RIGHT ANTECUBITAL  Final   Special Requests   Final    BOTTLES DRAWN AEROBIC AND ANAEROBIC Blood Culture results may not be optimal due to an excessive volume of blood received in culture bottles   Culture   Final    NO GROWTH < 24 HOURS Performed at [facility], [address] [place], [place] [address]    Report Status PENDING  Incomplete         Radiology Studies: Dg Chest Port 1 View  Result Date: 01/23/2019 CLINICAL DATA:  Chest tightness. Patient diagnosed with COVID-19 January 21, 2019 EXAM: PORTABLE CHEST 1 VIEW COMPARISON:   None. FINDINGS: No pneumothorax. The cardiomediastinal silhouette is unremarkable. There is patchy opacity in the left base. There may be mild opacity in the medial right lung base. No other acute abnormalities. IMPRESSION: Patchy opacity in the left base and possible mild opacity in the medial right lung base. Recommend follow-up to resolution. No pneumothorax. No other acute abnormalities. Electronically Signed   By: Dorise Bullion III M.D   On: 01/23/2019 11:11        Scheduled Meds: Continuous Infusions:   LOS: 0 days   The patient is critically ill with multiple organ systems failure and requires high complexity decision making for assessment and support, frequent evaluation and titration of therapies, application of advanced monitoring technologies and extensive interpretation of multiple databases. Critical Care Time devoted to patient care services described in this note  Time spent: 40 minutes     WOODS, Geraldo Docker, MD [facility] Pager [phone]  If 7PM-7AM, please contact night-coverage [email or web address] Password [facility] 01/24/2019, 4:47 PM

## 2019-01-24 NOTE — Plan of Care (Signed)

## 2019-01-24 NOTE — Progress Notes (Signed)
Patient was seen and examined in ED. Was requested by admin team to transfer to [facility]. Please see Dr Latina Craver H & P for further details. Patient was on HFNC at the time of D/C.

## 2019-01-24 NOTE — ED Notes (Addendum)
Date and time results received: 01/24/19 6:14 AM (use smartphrase ".now" to insert current time)  Test: d-dimer Critical Value: 838.05  Name of Provider Notified: Rufina Falco, NP via secure message  Orders Received? Or Actions Taken?: No additional orders received

## 2019-01-24 NOTE — ED Notes (Signed)
CBG 149. 

## 2019-01-24 NOTE — ED Notes (Signed)
Report given by this RN to [facility].

## 2019-01-24 NOTE — ED Notes (Signed)
Pt verbalized understanding of need for transport to [facility] for continued treatment of his COVID symptoms. Pt was in NAD at time of departure.

## 2019-01-24 NOTE — ED Notes (Signed)
Report to [facility].  

## 2019-01-25 LAB — LIPID PANEL
Cholesterol: 137 mg/dL (ref 0–200)
HDL: 25 mg/dL — ABNORMAL LOW (ref >40)
LDL Cholesterol: 100 mg/dL — ABNORMAL HIGH (ref 0–99)
Total CHOL/HDL Ratio: 5.5 RATIO
Triglycerides: 61 mg/dL (ref <150)
VLDL: 12 mg/dL (ref 0–40)

## 2019-01-25 LAB — COMPREHENSIVE METABOLIC PANEL
ALT: 44 U/L (ref 0–44)
AST: 44 U/L — ABNORMAL HIGH (ref 15–41)
Albumin: 3.3 g/dL — ABNORMAL LOW (ref 3.5–5.0)
Alkaline Phosphatase: 65 U/L (ref 38–126)
Anion gap: 13 (ref 5–15)
BUN: 19 mg/dL (ref 6–20)
CO2: 23 mmol/L (ref 22–32)
Calcium: 8.6 mg/dL — ABNORMAL LOW (ref 8.9–10.3)
Chloride: 104 mmol/L (ref 98–111)
Creatinine, Ser: 0.8 mg/dL (ref 0.61–1.24)
GFR calc Af Amer: >60 mL/min (ref >60)
GFR calc non Af Amer: >60 mL/min (ref >60)
Glucose, Bld: 137 mg/dL — ABNORMAL HIGH (ref 70–99)
Potassium: 4.1 mmol/L (ref 3.5–5.1)
Sodium: 140 mmol/L (ref 135–145)
Total Bilirubin: 0.2 mg/dL — ABNORMAL LOW (ref 0.3–1.2)
Total Protein: 7.2 g/dL (ref 6.5–8.1)

## 2019-01-25 LAB — CBC WITH DIFFERENTIAL/PLATELET
Abs Immature Granulocytes: 0.06 10*3/uL (ref 0.00–0.07)
Basophils Absolute: 0 10*3/uL (ref 0.0–0.1)
Basophils Relative: 0 %
Eosinophils Absolute: 0 10*3/uL (ref 0.0–0.5)
Eosinophils Relative: 0 %
HCT: 41.7 % (ref 39.0–52.0)
Hemoglobin: 13.9 g/dL (ref 13.0–17.0)
Immature Granulocytes: 1 %
Lymphocytes Relative: 12 %
Lymphs Abs: 1.5 10*3/uL (ref 0.7–4.0)
MCH: 29 pg (ref 26.0–34.0)
MCHC: 33.3 g/dL (ref 30.0–36.0)
MCV: 87.1 fL (ref 80.0–100.0)
Monocytes Absolute: 0.5 10*3/uL (ref 0.1–1.0)
Monocytes Relative: 4 %
Neutro Abs: 10.6 10*3/uL — ABNORMAL HIGH (ref 1.7–7.7)
Neutrophils Relative %: 83 %
Platelets: 271 10*3/uL (ref 150–400)
RBC: 4.79 MIL/uL (ref 4.22–5.81)
RDW: 13.2 % (ref 11.5–15.5)
WBC: 12.7 10*3/uL — ABNORMAL HIGH (ref 4.0–10.5)
nRBC: 0 % (ref 0.0–0.2)

## 2019-01-25 LAB — FERRITIN: Ferritin: 929 ng/mL — ABNORMAL HIGH (ref 24–336)

## 2019-01-25 LAB — PHOSPHORUS: Phosphorus: 3.4 mg/dL (ref 2.5–4.6)

## 2019-01-25 LAB — SARS CORONAVIRUS 2 (TAT 6-24 HRS): SARS Coronavirus 2: POSITIVE — AB

## 2019-01-25 LAB — HEPATITIS B SURFACE ANTIGEN: Hepatitis B Surface Ag: NONREACTIVE

## 2019-01-25 LAB — D-DIMER, QUANTITATIVE: D-Dimer, Quant: 0.62 ug/mL-FEU — ABNORMAL HIGH (ref 0.00–0.50)

## 2019-01-25 LAB — MAGNESIUM: Magnesium: 2.2 mg/dL (ref 1.7–2.4)

## 2019-01-25 LAB — C-REACTIVE PROTEIN: CRP: 13.6 mg/dL — ABNORMAL HIGH (ref <1.0)

## 2019-01-25 MED ORDER — ATORVASTATIN CALCIUM 10 MG PO TABS
20.0000 mg | ORAL_TABLET | Freq: Every day | ORAL | Status: DC
  Administered 2019-01-25 – 2019-01-26 (×2): 20 mg via ORAL
  Filled 2019-01-25 (×2): qty 2

## 2019-01-25 MED ORDER — IPRATROPIUM-ALBUTEROL 20-100 MCG/ACT IN AERS: 1.0000 | INHALATION_SPRAY | Freq: Four times a day (QID) | RESPIRATORY_TRACT | Status: DC | PRN

## 2019-01-25 MED ORDER — CHLORHEXIDINE GLUCONATE CLOTH 2 % EX PADS
6.0000 | MEDICATED_PAD | Freq: Every day | CUTANEOUS | Status: DC
  Administered 2019-01-25: 6 via TOPICAL

## 2019-01-25 NOTE — Progress Notes (Addendum)
PROGRESS NOTE    Devin Humphrey  UEA:[id number] DOB: 04-01-1970 DOA: 01/24/2019 PCP: Dale , MD   Brief Narrative:  48 y.o.WM PMHx Anxiety DJD, GERD, syncope,  diagnosed of COVID-19 pneumonia 4 days ago presented to the hospital with complaints of chest discomfort, fatigue and nonproductive cough. Prior to his diagnosis he has had flu type symptoms for the 7 days therefore went to an urgent care where he was tested for COVID-19. He was discharged home with antipyretics, self quarantine and routine hydration instructions. Despite of going home he continued to feel the same way therefore presented to the hospital. While waiting in the ER in the wheelchair he became very lightheaded, slightly diaphoretic and lost consciousness for couple of minutes. Denies any head trauma. During his evaluation his EKG was unremarkable. Cardiac enzymes were negative. On room air he was saturating 89% with improvement to 95% on 2 L. Chest x-ray showed left base and right middle lobe infiltrate. Given failure of outpatient treatment with supportive care for COVID-19 pneumonia and syncopal episode he was admitted to the hospital.  negative tobacco use, negative previous immunosuppressant use, negative hepatitis.  Counseled patient on risk and benefits of Actemra and patient stated he agreed to the use of Actemra if needed   Subjective: Last 24 hours afebrile, negative CP, negative N/V, negative abdominal pain.  Positive SOB but improved from admission   Assessment & Plan:   Active Problems:   Cervical disc disease   Anxiety   Hypercholesterolemia   Syncope   Acute respiratory failure with hypoxia (HCC)   Pneumonia due to COVID-19 virus   Covid pneumonia/acute respiratory failure with hypoxia COVID-19 Labs  Recent Labs    01/23/19 1146 01/24/19 0424 01/24/19 1745 01/25/19 0455  DDIMER  --   --  0.80* 0.62*  FERRITIN 538* 638*  --  929*  LDH 167  --  217*  --   CRP 21.9*  25.1*  --  13.6*    No results found for: SARSCOV2NAA  -Decadron 6 mg daily -Remdesivir per pharmacy protocol -Actemra x1 dose -Flutter valve -incentive spirometer -Combivent -Vitamins Covid protocol -Titrate O2 to maintain SPO2> 88% -Prone patient for 16 hours/day; if patient cannot tolerate prone 2 to 3 hours per shift -Morphine PRN  Anxiety -Currently stable  Syncope -Patient has been counseled not to get out of bed without assistance   DJD -Tylenol PRN -MorphinePRN  HLD -12/1 LDL = 100  -12/1 Lipitor 20 mg daily    DVT prophylaxis: Lovenox Code Status: Full Family Communication: 12/1 spoke with Misty Stanley (wife) counseled her on plan of care and answered all questions Disposition Plan: TBD   Consultants:  PCCM  Procedures/Significant Events:     I have personally reviewed and interpreted all radiology studies and my findings are as above.  VENTILATOR SETTINGS 96% Nasal cannula 12/1 Flow; 6 L/min SPO2; 96%   Cultures 11/30 SARS coronavirus pending 11/30 hepatitis B surface antigen nonreactive 11/30 HIV screen negative    Antimicrobials: Anti-infectives (From admission, onward)   Start     Stop   01/25/19 1000  remdesivir 100 mg in sodium chloride 0.9 % 250 mL IVPB     01/28/19 0959       Devices    LINES / TUBES:      Continuous Infusions: . remdesivir 100 mg in NS 250 mL       Objective: Vitals:   01/25/19 0600 01/25/19 0700 01/25/19 0715 01/25/19 0800  BP: 124/78 129/76  Pulse: 78 87    Resp: (!) 27 20    Temp:    97.9 F (36.6 C)  TempSrc:    Oral  SpO2: 93% 96% 96%   Weight:      Height:        Intake/Output Summary (Last 24 hours) at 01/25/2019 7017 Last data filed at 01/25/2019 0800 Gross per 24 hour  Intake -  Output 2050 ml  Net -2050 ml   Filed Weights   01/24/19 1843  Weight: 93.4 kg    Examination:  General: A/O x4, positive acute respiratory distress Eyes: negative scleral hemorrhage,  negative anisocoria, negative icterus ENT: Negative Runny nose, negative gingival bleeding, Neck:  Negative scars, masses, torticollis, lymphadenopathy, JVD Lungs: Clear to auscultation bilaterally without wheezes or crackles Cardiovascular: Regular rate and rhythm without murmur gallop or rub normal S1 and S2 Abdomen: negative abdominal pain, nondistended, positive soft, bowel sounds, no rebound, no ascites, no appreciable mass Extremities: No significant cyanosis, clubbing, or edema bilateral lower extremities Skin: Negative rashes, lesions, ulcers Psychiatric:  Negative depression, negative anxiety, negative fatigue, negative mania  Central nervous system:  Cranial nerves II through XII intact, tongue/uvula midline, all extremities muscle strength 5/5, sensation intact throughout, negative dysarthria, negative expressive aphasia, negative receptive aphasia.  .     Data Reviewed: Care during the described time interval was provided by me .  I have reviewed this patient's available data, including medical history, events of note, physical examination, and all test results as part of my evaluation.   CBC: Recent Labs  Lab 01/23/19 1146 01/24/19 0424 01/24/19 1745 01/25/19 0455  WBC 7.8 5.7 10.0 12.7*  NEUTROABS 6.5 4.6  --  10.6*  HGB 14.6 13.8 13.6 13.9  HCT 42.3 41.5 40.3 41.7  MCV 82.5 85.7 86.7 87.1  PLT 185 211 229 271   Basic Metabolic Panel: Recent Labs  Lab 01/23/19 1146 01/24/19 0424 01/24/19 1745 01/25/19 0455  NA 136 137  --  140  K 3.8 3.9  --  4.1  CL 97* 101  --  104  CO2 25 23  --  23  GLUCOSE 112* 146*  --  137*  BUN 10 13  --  19  CREATININE 1.05 0.97 0.71 0.80  CALCIUM 8.5* 8.2*  --  8.6*  MG  --  2.3 2.2 2.2  PHOS  --  3.5 2.3* 3.4   GFR: Estimated Creatinine Clearance: 126.6 mL/min (by C-G formula based on SCr of 0.8 mg/dL). Liver Function Tests: Recent Labs  Lab 01/23/19 1146 01/24/19 0424 01/25/19 0455  AST 29 32 44*  ALT 23 28 44   ALKPHOS 68 65 65  BILITOT 0.5 0.7 0.2*  PROT 7.7 8.0 7.2  ALBUMIN 3.8 3.8 3.3*   No results for input(s): LIPASE, AMYLASE in the last 168 hours. No results for input(s): AMMONIA in the last 168 hours. Coagulation Profile: No results for input(s): INR, PROTIME in the last 168 hours. Cardiac Enzymes: No results for input(s): CKTOTAL, CKMB, CKMBINDEX, TROPONINI in the last 168 hours. BNP (last 3 results) No results for input(s): PROBNP in the last 8760 hours. HbA1C: No results for input(s): HGBA1C in the last 72 hours. CBG: Recent Labs  Lab 01/23/19 1916 01/24/19 0002 01/24/19 0126 01/24/19 0409 01/24/19 0816  GLUCAP 149* 130* 149* 128* 126*   Lipid Profile: Recent Labs    01/23/19 1146 01/25/19 0455  CHOL  --  137  HDL  --  25*  LDLCALC  --  100*  TRIG 103 61  CHOLHDL  --  5.5   Thyroid Function Tests: No results for input(s): TSH, T4TOTAL, FREET4, T3FREE, THYROIDAB in the last 72 hours. Anemia Panel: Recent Labs    01/24/19 0424 01/25/19 0455  FERRITIN 638* 929*   Urine analysis:    Component Value Date/Time   COLORURINE YELLOW 11/02/2014 0641   APPEARANCEUR HAZY (A) 11/02/2014 0641   LABSPEC 1.018 11/02/2014 0641   PHURINE 8.5 (H) 11/02/2014 0641   GLUCOSEU NEGATIVE 11/02/2014 0641   HGBUR NEGATIVE 11/02/2014 0641   BILIRUBINUR NEGATIVE 11/02/2014 0641   KETONESUR NEGATIVE 11/02/2014 0641   PROTEINUR NEGATIVE 11/02/2014 0641   UROBILINOGEN 0.2 11/02/2014 0641   NITRITE NEGATIVE 11/02/2014 0641   LEUKOCYTESUR NEGATIVE 11/02/2014 0641   Sepsis Labs: @LABRCNTIP (procalcitonin:4,lacticidven:4)  ) Recent Results (from the past 240 hour(s))  Blood Culture (routine x 2)     Status: None (Preliminary result)   Collection Time: 01/23/19 11:46 AM   Specimen: BLOOD  Result Value Ref Range Status   Specimen Description BLOOD LEFT ANTECUBITAL  Final   Special Requests   Final    BOTTLES DRAWN AEROBIC AND ANAEROBIC Blood Culture adequate volume   Culture    Final    NO GROWTH 2 DAYS Performed at [facility], [address] [place], [place] [address]    Report Status PENDING  Incomplete  Blood Culture (routine x 2)     Status: None (Preliminary result)   Collection Time: 01/23/19 11:46 AM   Specimen: BLOOD  Result Value Ref Range Status   Specimen Description BLOOD RIGHT ANTECUBITAL  Final   Special Requests   Final    BOTTLES DRAWN AEROBIC AND ANAEROBIC Blood Culture results may not be optimal due to an excessive volume of blood received in culture bottles   Culture   Final    NO GROWTH 2 DAYS Performed at [facility], [address] [place], [place] [address]    Report Status PENDING  Incomplete         Radiology Studies: Dg Chest Port 1 View  Result Date: 01/23/2019 CLINICAL DATA:  Chest tightness. Patient diagnosed with COVID-19 January 21, 2019 EXAM: PORTABLE CHEST 1 VIEW COMPARISON:  None. FINDINGS: No pneumothorax. The cardiomediastinal silhouette is unremarkable. There is patchy opacity in the left base. There may be mild opacity in the medial right lung base. No other acute abnormalities. IMPRESSION: Patchy opacity in the left base and possible mild opacity in the medial right lung base. Recommend follow-up to resolution. No pneumothorax. No other acute abnormalities. Electronically Signed   By: Gerome Samavid  Williams III M.D   On: 01/23/2019 11:11        Scheduled Meds: . Chlorhexidine Gluconate Cloth  6 each Topical Daily  . dexamethasone (DECADRON) injection  6 mg Intravenous Q24H  . enoxaparin (LOVENOX) injection  40 mg Subcutaneous Q24H  . Ipratropium-Albuterol  1 puff Inhalation QID  . vitamin C  500 mg Oral Daily  . zinc sulfate  220 mg Oral Daily   Continuous Infusions: . remdesivir 100 mg in NS 250 mL       LOS: 1 day   The patient is critically ill with multiple organ systems failure and requires high complexity decision making for assessment and support, frequent evaluation and  titration of therapies, application of advanced monitoring technologies and extensive interpretation of multiple databases. Critical Care Time devoted to patient care services described in this note  Time spent: 40 minutes     Sheina Mcleish  J, MD [facility] Pager [phone]  If 7PM-7AM, please contact night-coverage [email or web address] Password [facility] 01/25/2019, 8:24 AM

## 2019-01-26 LAB — CBC WITH DIFFERENTIAL/PLATELET
Abs Immature Granulocytes: 0.21 10*3/uL — ABNORMAL HIGH (ref 0.00–0.07)
Basophils Absolute: 0.1 10*3/uL (ref 0.0–0.1)
Basophils Relative: 1 %
Eosinophils Absolute: 0 10*3/uL (ref 0.0–0.5)
Eosinophils Relative: 0 %
HCT: 42.5 % (ref 39.0–52.0)
Hemoglobin: 13.9 g/dL (ref 13.0–17.0)
Immature Granulocytes: 1 %
Lymphocytes Relative: 10 %
Lymphs Abs: 1.5 10*3/uL (ref 0.7–4.0)
MCH: 28.9 pg (ref 26.0–34.0)
MCHC: 32.7 g/dL (ref 30.0–36.0)
MCV: 88.4 fL (ref 80.0–100.0)
Monocytes Absolute: 0.6 10*3/uL (ref 0.1–1.0)
Monocytes Relative: 4 %
Neutro Abs: 12.2 10*3/uL — ABNORMAL HIGH (ref 1.7–7.7)
Neutrophils Relative %: 84 %
Platelets: 313 10*3/uL (ref 150–400)
RBC: 4.81 MIL/uL (ref 4.22–5.81)
RDW: 13.2 % (ref 11.5–15.5)
WBC: 14.5 10*3/uL — ABNORMAL HIGH (ref 4.0–10.5)
nRBC: 0 % (ref 0.0–0.2)

## 2019-01-26 LAB — COMPREHENSIVE METABOLIC PANEL
ALT: 139 U/L — ABNORMAL HIGH (ref 0–44)
AST: 92 U/L — ABNORMAL HIGH (ref 15–41)
Albumin: 3.3 g/dL — ABNORMAL LOW (ref 3.5–5.0)
Alkaline Phosphatase: 72 U/L (ref 38–126)
Anion gap: 13 (ref 5–15)
BUN: 19 mg/dL (ref 6–20)
CO2: 23 mmol/L (ref 22–32)
Calcium: 8.6 mg/dL — ABNORMAL LOW (ref 8.9–10.3)
Chloride: 103 mmol/L (ref 98–111)
Creatinine, Ser: 0.83 mg/dL (ref 0.61–1.24)
GFR calc Af Amer: >60 mL/min (ref >60)
GFR calc non Af Amer: >60 mL/min (ref >60)
Glucose, Bld: 98 mg/dL (ref 70–99)
Potassium: 3.7 mmol/L (ref 3.5–5.1)
Sodium: 139 mmol/L (ref 135–145)
Total Bilirubin: 0.3 mg/dL (ref 0.3–1.2)
Total Protein: 6.9 g/dL (ref 6.5–8.1)

## 2019-01-26 LAB — MRSA PCR SCREENING: MRSA by PCR: NEGATIVE

## 2019-01-26 LAB — MAGNESIUM: Magnesium: 2.2 mg/dL (ref 1.7–2.4)

## 2019-01-26 LAB — C-REACTIVE PROTEIN: CRP: 7.5 mg/dL — ABNORMAL HIGH (ref <1.0)

## 2019-01-26 LAB — D-DIMER, QUANTITATIVE: D-Dimer, Quant: 0.62 ug/mL-FEU — ABNORMAL HIGH (ref 0.00–0.50)

## 2019-01-26 LAB — FERRITIN: Ferritin: 797 ng/mL — ABNORMAL HIGH (ref 24–336)

## 2019-01-26 LAB — PHOSPHORUS: Phosphorus: 3.3 mg/dL (ref 2.5–4.6)

## 2019-01-26 MED ORDER — ORAL CARE MOUTH RINSE: 15.0000 mL | Freq: Two times a day (BID) | OROMUCOSAL | Status: DC

## 2019-01-26 NOTE — Plan of Care (Signed)

## 2019-01-26 NOTE — Progress Notes (Signed)
PROGRESS NOTE    Devin Humphrey  TKW:[id number] DOB: [birth year] DOA: 01/24/2019 PCP: Einar Pheasant, MD    Brief Narrative:  48 year old male who presented with chest discomfort, fatigue and nonproductive cough.  He was diagnosed with COVID-19 pneumonia about 4 days prior to hospitalization.  Reported about 7 days of flulike symptoms after his diagnosis he was sent home where his symptoms continue to worsen (failed outpatient therapy).  On his initial physical examination his oxygen saturation was 89% on room air, blood pressure 126/73, heart rate 102, respiratory rate 32, oxygen saturation 92%.  He had dry mucous membranes, his lungs had bibasilar rhonchi, heart S1-S2 present rhythm, abdomen soft, no lower extremity edema.  While in the emergency department sitting in a wheelchair patient had a syncope episode. Sodium 136, potassium 3.8, chloride 97, bicarb 25, glucose 112, BUN 10, creatinine 1.0, white cell count 7.8, hemoglobin 14.6, hematocrit 42.3, platelets 185.  His chest radiograph had faint bibasilar interstitial infiltrates.  EKG 104 bpm, normal axis, normal intervals, sinus rhythm, no ST segment or T wave changes.  Patient was admitted to the hospital according diagnosis of acute hypoxic respiratory failure due to SARS COVID-19 viral pneumonia.  Patient has been responding well to medical therapy with dexamethasone, remdesivir, and 1 dose of Actemra.  Assessment & Plan:   Active Problems:   Cervical disc disease   Anxiety   Hypercholesterolemia   Syncope   Acute respiratory failure with hypoxia (HCC)   Pneumonia due to COVID-19 virus   1.  Acute hypoxic respiratory failure due to SARS COVID-19 viral pneumonia. Patient has received Actemra x1.  RR: 19  Pulse oxymetry: 93%  Fi02: 21% room air.  [place]    01/23/19 1146 01/24/19 0424 01/24/19 1745 01/25/19 0455 01/26/19 0155  DDIMER  --   --  0.80* 0.62* 0.62*  FERRITIN 538* 638*  --  929*  797*  LDH 167  --  217*  --   --   CRP 21.9* 25.1*  --  13.6* 7.5*    Lab Results  Component Value Date   SARSCOV2NAA POSITIVE (A) 01/24/2019    Inflammatory markers are trending down and symptoms are improving.   Continue medical therapy with remdesivir #4/5, and systemic corticosteroids with dexamethasone. Continue with antitussive agents, bronchodilators and airway clearing techniques with flutter valve and incentive spirometer.   2. Obesity with dyslipidemia. Calculated BMI is 31,3. Will continue statin therapy.   3. DJD. Continue pain control with tylenol and as needed morphine.   4. Syncope. Possibly related to hypoxemia.    DVT prophylaxis: enoxaparin   Code Status:  full Family Communication: no family at the bedside  Disposition Plan/ discharge barriers: pending clinical improvement.   Body mass index is 31.32 kg/m. Malnutrition Type:      Malnutrition Characteristics:      Nutrition Interventions:     RN Pressure Injury Documentation:     Consultants:     Procedures:     Antimicrobials:       Subjective: Patient is feeling better, dyspnea has been improving, no nausea or vomiting, no chest pain.   Objective: Vitals:   01/25/19 1545 01/25/19 1953 01/26/19 0300 01/26/19 0800  BP: 138/78 117/78 138/89 (!) 133/94  Pulse: 91 76 72 86  Resp:  20 16 19   Temp: (!) 97.5 F (36.4 C) (!) 97.4 F (36.3 C) (!) 97.2 F (36.2 C) 98.9 F (37.2 C)  TempSrc: Oral Oral Oral Axillary  SpO2: 91%  94% 93% 93%  Weight:      Height:        Intake/Output Summary (Last 24 hours) at 01/26/2019 0835 Last data filed at 01/26/2019 0500 Gross per 24 hour  Intake 590 ml  Output 1125 ml  Net -535 ml   Filed Weights   01/24/19 1843  Weight: 93.4 kg    Examination:   General: Not in pain or dyspnea.  Neurology: Awake and alert, non focal  E ENT: no pallor, no icterus, oral mucosa moist Cardiovascular: No JVD. S1-S2 present, rhythmic, no gallops,  rubs, or murmurs. No lower extremity edema. Pulmonary: positive breath sounds bilaterally. Gastrointestinal. Abdomen with no organomegaly, non tender, no rebound or guarding Skin. No rashes Musculoskeletal: no joint deformities     Data Reviewed: I have personally reviewed following labs and imaging studies  CBC: Recent Labs  Lab 01/23/19 1146 01/24/19 0424 01/24/19 1745 01/25/19 0455 01/26/19 0155  WBC 7.8 5.7 10.0 12.7* 14.5*  NEUTROABS 6.5 4.6  --  10.6* 12.2*  HGB 14.6 13.8 13.6 13.9 13.9  HCT 42.3 41.5 40.3 41.7 42.5  MCV 82.5 85.7 86.7 87.1 88.4  PLT 185 211 229 271 313   Basic Metabolic Panel: Recent Labs  Lab 01/23/19 1146 01/24/19 0424 01/24/19 1745 01/25/19 0455 01/26/19 0155  NA 136 137  --  140 139  K 3.8 3.9  --  4.1 3.7  CL 97* 101  --  104 103  CO2 25 23  --  23 23  GLUCOSE 112* 146*  --  137* 98  BUN 10 13  --  19 19  CREATININE 1.05 0.97 0.71 0.80 0.83  CALCIUM 8.5* 8.2*  --  8.6* 8.6*  MG  --  2.3 2.2 2.2 2.2  PHOS  --  3.5 2.3* 3.4 3.3   GFR: Estimated Creatinine Clearance: 122 mL/min (by C-G formula based on SCr of 0.83 mg/dL). Liver Function Tests: Recent Labs  Lab 01/23/19 1146 01/24/19 0424 01/25/19 0455 01/26/19 0155  AST 29 32 44* 92*  ALT 23 28 44 139*  ALKPHOS 68 65 65 72  BILITOT 0.5 0.7 0.2* 0.3  PROT 7.7 8.0 7.2 6.9  ALBUMIN 3.8 3.8 3.3* 3.3*   No results for input(s): LIPASE, AMYLASE in the last 168 hours. No results for input(s): AMMONIA in the last 168 hours. Coagulation Profile: No results for input(s): INR, PROTIME in the last 168 hours. Cardiac Enzymes: No results for input(s): CKTOTAL, CKMB, CKMBINDEX, TROPONINI in the last 168 hours. BNP (last 3 results) No results for input(s): PROBNP in the last 8760 hours. HbA1C: No results for input(s): HGBA1C in the last 72 hours. CBG: Recent Labs  Lab 01/23/19 1916 01/24/19 0002 01/24/19 0126 01/24/19 0409 01/24/19 0816  GLUCAP 149* 130* 149* 128* 126*   Lipid  Profile: Recent Labs    01/23/19 1146 01/25/19 0455  CHOL  --  137  HDL  --  25*  LDLCALC  --  100*  TRIG 103 61  CHOLHDL  --  5.5   Thyroid Function Tests: No results for input(s): TSH, T4TOTAL, FREET4, T3FREE, THYROIDAB in the last 72 hours. Anemia Panel: Recent Labs    01/25/19 0455 01/26/19 0155  FERRITIN 929* 797*      Radiology Studies: I have reviewed all of the imaging during this hospital visit personally     Scheduled Meds: . atorvastatin  20 mg Oral q1800  . Chlorhexidine Gluconate Cloth  6 each Topical Daily  . dexamethasone (DECADRON) injection  6 mg Intravenous Q24H  . enoxaparin (LOVENOX) injection  40 mg Subcutaneous Q24H  . mouth rinse  15 mL Mouth Rinse BID  . vitamin C  500 mg Oral Daily  . zinc sulfate  220 mg Oral Daily   Continuous Infusions: . remdesivir 100 mg in NS 250 mL Stopped (01/25/19 1058)     LOS: 2 days         Annett Gula, MD

## 2019-01-26 NOTE — Progress Notes (Signed)
Pt denied RN to call family/wife for updates this hs. Pt has been updating family independently via personal cell phone.

## 2019-01-26 NOTE — Progress Notes (Signed)
   01/26/19 1500  Family/Significant Other Communication  Family/Significant Other Update Other (Comment) (patient states he will update family.)

## 2019-01-26 NOTE — Plan of Care (Signed)
  Problem: Education: Goal: Knowledge of risk factors and measures for prevention of condition will improve Outcome: Progressing   Problem: Coping: Goal: Psychosocial and spiritual needs will be supported Outcome: Progressing   Problem: Respiratory: Goal: Will maintain a patent airway Outcome: Progressing   Problem: Clinical Measurements: Goal: Respiratory complications will improve Outcome: Progressing   Problem: Activity: Goal: Risk for activity intolerance will decrease Outcome: Progressing   Problem: Coping: Goal: Level of anxiety will decrease Outcome: Progressing   Problem: Pain Managment: Goal: General experience of comfort will improve Outcome: Progressing   Problem: Safety: Goal: Ability to remain free from injury will improve Outcome: Progressing   Problem: Skin Integrity: Goal: Risk for impaired skin integrity will decrease Outcome: Progressing

## 2019-01-27 LAB — COMPREHENSIVE METABOLIC PANEL
ALT: 94 U/L — ABNORMAL HIGH (ref 0–44)
AST: 39 U/L (ref 15–41)
Albumin: 3.3 g/dL — ABNORMAL LOW (ref 3.5–5.0)
Alkaline Phosphatase: 67 U/L (ref 38–126)
Anion gap: 11 (ref 5–15)
BUN: 24 mg/dL — ABNORMAL HIGH (ref 6–20)
CO2: 23 mmol/L (ref 22–32)
Calcium: 8.3 mg/dL — ABNORMAL LOW (ref 8.9–10.3)
Chloride: 103 mmol/L (ref 98–111)
Creatinine, Ser: 0.95 mg/dL (ref 0.61–1.24)
GFR calc Af Amer: >60 mL/min (ref >60)
GFR calc non Af Amer: >60 mL/min (ref >60)
Glucose, Bld: 104 mg/dL — ABNORMAL HIGH (ref 70–99)
Potassium: 3.9 mmol/L (ref 3.5–5.1)
Sodium: 137 mmol/L (ref 135–145)
Total Bilirubin: 0.7 mg/dL (ref 0.3–1.2)
Total Protein: 6.8 g/dL (ref 6.5–8.1)

## 2019-01-27 LAB — FERRITIN: Ferritin: 691 ng/mL — ABNORMAL HIGH (ref 24–336)

## 2019-01-27 LAB — C-REACTIVE PROTEIN: CRP: 3.9 mg/dL — ABNORMAL HIGH (ref <1.0)

## 2019-01-27 LAB — GLUCOSE, CAPILLARY
Glucose-Capillary: 138 mg/dL — ABNORMAL HIGH (ref 70–99)
Glucose-Capillary: 94 mg/dL (ref 70–99)

## 2019-01-27 LAB — D-DIMER, QUANTITATIVE: D-Dimer, Quant: 0.53 ug/mL-FEU — ABNORMAL HIGH (ref 0.00–0.50)

## 2019-01-27 MED ORDER — GUAIFENESIN-DM 100-10 MG/5ML PO SYRP: 5.0000 mL | ORAL_SOLUTION | Freq: Four times a day (QID) | ORAL | 0 refills | Status: DC | PRN

## 2019-01-27 MED ORDER — GUAIFENESIN-DM 100-10 MG/5ML PO SYRP: 5.0000 mL | ORAL_SOLUTION | ORAL | Status: DC | PRN

## 2019-01-27 NOTE — Care Management (Signed)
Case manager acknowledged CM consult. Patient has a PCP and will schedule his telephonic followup appointment. No further needs identified.     Chana Bode, RN Case Manager [phone]

## 2019-01-27 NOTE — Progress Notes (Signed)
Patient discharged to home, AVS reviewed and all questions answered. IV's removed. Contract signed and placed in chart. Patient assisted to the exit via wheelchair and wife provided transportation.

## 2019-01-27 NOTE — Discharge Instructions (Signed)
Person Under Monitoring Name: Devin Humphrey  Location: [place] [address]   Infection Prevention Recommendations for Individuals Confirmed to have, or Being Evaluated for, 2019 Novel Coronavirus (COVID-19) Infection Who Receive Care at Home  Individuals who are confirmed to have, or are being evaluated for, COVID-19 should follow the prevention steps below until a healthcare provider or local or state health department says they can return to normal activities.  Stay home except to get medical care You should restrict activities outside your home, except for getting medical care. Do not go to work, school, or public areas, and do not use public transportation or taxis.  Call ahead before visiting your doctor Before your medical appointment, call the healthcare provider and tell them that you have, or are being evaluated for, COVID-19 infection. This will help the healthcare providers office take steps to keep other people from getting infected. Ask your healthcare provider to call the local or state health department.  Monitor your symptoms Seek prompt medical attention if your illness is worsening (e.g., difficulty breathing). Before going to your medical appointment, call the healthcare provider and tell them that you have, or are being evaluated for, COVID-19 infection. Ask your healthcare provider to call the local or state health department.  Wear a facemask You should wear a facemask that covers your nose and mouth when you are in the same room with other people and when you visit a healthcare provider. People who live with or visit you should also wear a facemask while they are in the same room with you.  Separate yourself from other people in your home As much as possible, you should stay in a different room from other people in your home. Also, you should use a separate bathroom, if available.  Avoid sharing household items You should not  share dishes, drinking glasses, cups, eating utensils, towels, bedding, or other items with other people in your home. After using these items, you should wash them thoroughly with soap and water.  Cover your coughs and sneezes Cover your mouth and nose with a tissue when you cough or sneeze, or you can cough or sneeze into your sleeve. Throw used tissues in a lined trash can, and immediately wash your hands with soap and water for at least 20 seconds or use an alcohol-based hand rub.  Wash your [facility] your hands often and thoroughly with soap and water for at least 20 seconds. You can use an alcohol-based hand sanitizer if soap and water are not available and if your hands are not visibly dirty. Avoid touching your eyes, nose, and mouth with unwashed hands.   Prevention Steps for Caregivers and Household Members of Individuals Confirmed to have, or Being Evaluated for, COVID-19 Infection Being Cared for in the Home  If you live with, or provide care at home for, a person confirmed to have, or being evaluated for, COVID-19 infection please follow these guidelines to prevent infection:  Follow healthcare providers instructions Make sure that you understand and can help the patient follow any healthcare provider instructions for all care.  Provide for the patients basic needs You should help the patient with basic needs in the home and provide support for getting groceries, prescriptions, and other personal needs.  Monitor the patients symptoms If they are getting sicker, call his or her medical provider and tell them that the patient has, or is being evaluated for, COVID-19 infection. This will help the healthcare providers office  take steps to keep other people from getting infected. Ask the healthcare provider to call the local or state health department.  Limit the number of people who have contact with the patient  If possible, have only one caregiver for the  patient.  Other household members should stay in another home or place of residence. If this is not possible, they should stay  in another room, or be separated from the patient as much as possible. Use a separate bathroom, if available.  Restrict visitors who do not have an essential need to be in the home.  Keep older adults, very young children, and other sick people away from the patient Keep older adults, very young children, and those who have compromised immune systems or chronic health conditions away from the patient. This includes people with chronic heart, lung, or kidney conditions, diabetes, and cancer.  Ensure good ventilation Make sure that shared spaces in the home have good air flow, such as from an air conditioner or an opened window, weather permitting.  Wash your hands often  Wash your hands often and thoroughly with soap and water for at least 20 seconds. You can use an alcohol based hand sanitizer if soap and water are not available and if your hands are not visibly dirty.  Avoid touching your eyes, nose, and mouth with unwashed hands.  Use disposable paper towels to dry your hands. If not available, use dedicated cloth towels and replace them when they become wet.  Wear a facemask and gloves  Wear a disposable facemask at all times in the room and gloves when you touch or have contact with the patients blood, body fluids, and/or secretions or excretions, such as sweat, saliva, sputum, nasal mucus, vomit, urine, or feces.  Ensure the mask fits over your nose and mouth tightly, and do not touch it during use.  Throw out disposable facemasks and gloves after using them. Do not reuse.  Wash your hands immediately after removing your facemask and gloves.  If your personal clothing becomes contaminated, carefully remove clothing and launder. Wash your hands after handling contaminated clothing.  Place all used disposable facemasks, gloves, and other waste in a lined  container before disposing them with other household waste.  Remove gloves and wash your hands immediately after handling these items.  Do not share dishes, glasses, or other household items with the patient  Avoid sharing household items. You should not share dishes, drinking glasses, cups, eating utensils, towels, bedding, or other items with a patient who is confirmed to have, or being evaluated for, COVID-19 infection.  After the person uses these items, you should wash them thoroughly with soap and water.  Wash laundry thoroughly  Immediately remove and wash clothes or bedding that have blood, body fluids, and/or secretions or excretions, such as sweat, saliva, sputum, nasal mucus, vomit, urine, or feces, on them.  Wear gloves when handling laundry from the patient.  Read and follow directions on labels of laundry or clothing items and detergent. In general, wash and dry with the warmest temperatures recommended on the label.  Clean all areas the individual has used often  Clean all touchable surfaces, such as counters, tabletops, doorknobs, bathroom fixtures, toilets, phones, keyboards, tablets, and bedside tables, every day. Also, clean any surfaces that may have blood, body fluids, and/or secretions or excretions on them.  Wear gloves when cleaning surfaces the patient has come in contact with.  Use a diluted bleach solution (e.g., dilute bleach with 1 part  bleach and 10 parts water) or a household disinfectant with a label that says EPA-registered for coronaviruses. To make a bleach solution at home, add 1 tablespoon of bleach to 1 quart (4 cups) of water. For a larger supply, add  cup of bleach to 1 gallon (16 cups) of water.  Read labels of cleaning products and follow recommendations provided on product labels. Labels contain instructions for safe and effective use of the cleaning product including precautions you should take when applying the product, such as wearing gloves or  eye protection and making sure you have good ventilation during use of the product.  Remove gloves and wash hands immediately after cleaning.  Monitor yourself for signs and symptoms of illness Caregivers and household members are considered close contacts, should monitor their health, and will be asked to limit movement outside of the home to the extent possible. Follow the monitoring steps for close contacts listed on the symptom monitoring form.   ? If you have additional questions, contact your local health department or call the epidemiologist on call at [phone] (available 24/7). ? This guidance is subject to change. For the most up-to-date guidance from [facility], please refer to their website: [email or web address]

## 2019-01-27 NOTE — Discharge Summary (Signed)
Physician Discharge Summary  Devin Humphrey:[id number] DOB: [birth year] DOA: 01/24/2019  PCP: Dale Mullen, MD  Admit date: 01/24/2019 Discharge date: 01/27/2019  Admitted From: Home  Disposition:  Home   Recommendations for Outpatient Follow-up and new medication changes:  1. Follow up with Dr. Lorin Picket in 2 weeks,  2. Continue self quarantine for 2 weeks, use a mask in public and maintain physical distancing.   Home Health: no   Equipment/Devices: no    Discharge Condition: stable  CODE STATUS: full  Diet recommendation: regular   Brief/Interim Summary: 48 year old male who presented with chest discomfort, fatigue and nonproductive cough.  He was diagnosed with COVID-19 pneumonia about 4 days prior to hospitalization.  Reported about 7 days of flulike symptoms after his diagnosis he was sent home where his symptoms continue to worsen (failed outpatient therapy).  On his initial physical examination his oxygen saturation was 89% on room air, blood pressure 126/73, heart rate 102, respiratory rate 32, oxygen saturation 92% on supplemental 02.  He had dry mucous membranes, his lungs had bibasilar rhonchi, heart S1-S2 present rhythm, abdomen soft, no lower extremity edema.  While in the emergency department sitting in a wheelchair patient had a syncope episode. Sodium 136, potassium 3.8, chloride 97, bicarb 25, glucose 112, BUN 10, creatinine 1.0, white cell count 7.8, hemoglobin 14.6, hematocrit 42.3, platelets 185.  His chest radiograph had faint bibasilar interstitial infiltrates.  EKG 104 bpm, normal axis, normal intervals, sinus rhythm, no ST segment or T wave changes.  Patient was admitted to the hospital according diagnosis of acute hypoxic respiratory failure due to SARS COVID-19 viral pneumonia.  Patient responded well to medical therapy with dexamethasone, remdesivir, and 1 dose of Actemra.  1.  Acute hypoxic respiratory failure due to SARS COVID-19 viral pneumonia.   Patient was admitted to the medical ward, he received supplemental oxygen per nasal cannula, intravenous corticosteroids with dexamethasone, remdesivir and 1 dose of Actemra.  He was treated with antitussive agents, bronchodilators, and airway clearing techniques with flutter valve and incentive spirometer.  Patient symptoms and inflammatory markers improved, his discharge oximetry is 93 to 95% on room air.  2.  Obesity with dyslipidemia.  His calculated BMI is 31.3, continue statin therapy.  3.  Degenerative joint disease.  Remained well controlled.  4.  Syncope.  Likely multifactorial, his electrocardiogram had no arrhythmias, or Brugada criteria.   Discharge Diagnoses:  Active Problems:   Cervical disc disease   Anxiety   Hypercholesterolemia   Syncope   Acute respiratory failure with hypoxia (HCC)   Pneumonia due to COVID-19 virus    Discharge Instructions   Allergies as of 01/27/2019   No Known Allergies     Medication List    STOP taking these medications   ascorbic acid 500 MG tablet Commonly known as: VITAMIN C   guaiFENesin 600 MG 12 hr tablet Commonly known as: MUCINEX   methylPREDNISolone sodium succinate 40 mg/mL injection Commonly known as: SOLU-MEDROL   pseudoephedrine 120 MG 12 hr tablet Commonly known as: SUDAFED   zinc sulfate 220 (50 Zn) MG capsule     TAKE these medications   acetaminophen 325 MG tablet Commonly known as: TYLENOL Take 650 mg by mouth every 6 (six) hours as needed for mild pain, fever or headache.   guaiFENesin-dextromethorphan 100-10 MG/5ML syrup Commonly known as: ROBITUSSIN DM Take 5 mLs by mouth every 6 (six) hours as needed for cough.       No Known Allergies  Consultations:  Procedures/Studies: Dg Chest Port 1 View  Result Date: 01/23/2019 CLINICAL DATA:  Chest tightness. Patient diagnosed with COVID-19 January 21, 2019 EXAM: PORTABLE CHEST 1 VIEW COMPARISON:  None. FINDINGS: No pneumothorax. The  cardiomediastinal silhouette is unremarkable. There is patchy opacity in the left base. There may be mild opacity in the medial right lung base. No other acute abnormalities. IMPRESSION: Patchy opacity in the left base and possible mild opacity in the medial right lung base. Recommend follow-up to resolution. No pneumothorax. No other acute abnormalities. Electronically Signed   By: Dorise Bullion III M.D   On: 01/23/2019 11:11      Procedures:   Subjective: Patient is feeling better, no nausea or vomiting, no chest pain or further syncope episodes.   Discharge Exam: Vitals:   01/27/19 0509 01/27/19 0815  BP:  115/80  Pulse:  86  Resp: 19 18  Temp: (!) 97.4 F (36.3 C) 97.8 F (36.6 C)  SpO2:  93%   Vitals:   01/26/19 1610 01/26/19 2109 01/27/19 0509 01/27/19 0815  BP: 106/69 117/80  115/80  Pulse: 83 78  86  Resp: 18 18 19 18   Temp: 97.8 F (36.6 C) (!) 97.3 F (36.3 C) (!) 97.4 F (36.3 C) 97.8 F (36.6 C)  TempSrc: Oral Oral Oral Oral  SpO2: 91% 95%  93%  Weight:      Height:        General: Not in pain or dyspnea Neurology: Awake and alert, non focal  E ENT: no pallor, no icterus, oral mucosa moist Cardiovascular: No JVD. S1-S2 present, rhythmic, no gallops, rubs, or murmurs. No lower extremity edema. Pulmonary: positive breath sounds bilaterally. Gastrointestinal. Abdomen with no organomegaly, non tender, no rebound or guarding Skin. No rashes Musculoskeletal: no joint deformities   The results of significant diagnostics from this hospitalization (including imaging, microbiology, ancillary and laboratory) are listed below for reference.     Microbiology: Recent Results (from the past 240 hour(s))  Blood Culture (routine x 2)     Status: None (Preliminary result)   Collection Time: 01/23/19 11:46 AM   Specimen: BLOOD  Result Value Ref Range Status   Specimen Description BLOOD LEFT ANTECUBITAL  Final   Special Requests   Final    BOTTLES DRAWN AEROBIC AND  ANAEROBIC Blood Culture adequate volume   Culture   Final    NO GROWTH 4 DAYS Performed at [facility], [address] [place], [place] [address]    Report Status PENDING  Incomplete  Blood Culture (routine x 2)     Status: None (Preliminary result)   Collection Time: 01/23/19 11:46 AM   Specimen: BLOOD  Result Value Ref Range Status   Specimen Description BLOOD RIGHT ANTECUBITAL  Final   Special Requests   Final    BOTTLES DRAWN AEROBIC AND ANAEROBIC Blood Culture results may not be optimal due to an excessive volume of blood received in culture bottles   Culture   Final    NO GROWTH 4 DAYS Performed at [facility], [address] [place], [place] [address]    Report Status PENDING  Incomplete  SARS CORONAVIRUS 2 (TAT 6-24 HRS) Nasopharyngeal Nasopharyngeal Swab     Status: Abnormal   Collection Time: 01/24/19  5:01 PM   Specimen: Nasopharyngeal Swab  Result Value Ref Range Status   SARS Coronavirus 2 POSITIVE (A) NEGATIVE Final    Comment: RESULT CALLED TO, READ BACK BY AND VERIFIED WITH: Debbie RN 15:30 01/25/19 ([email or web address]) (NOTE) SARS-CoV-2  target nucleic acids are DETECTED. The SARS-CoV-2 RNA is generally detectable in upper and lower respiratory specimens during the acute phase of infection. Positive results are indicative of the presence of SARS-CoV-2 RNA. Clinical correlation with patient history and other diagnostic information is  necessary to determine patient infection status. Positive results do not rule out bacterial infection or co-infection with other viruses.  The expected result is Negative. Fact Sheet for Patients: HairSlick.no Fact Sheet for Healthcare Providers: quierodirigir.com This test is not yet approved or cleared by the [place] FDA and  has been authorized for detection and/or diagnosis of SARS-CoV-2 by FDA under an Emergency Use Authorization (EUA). This EUA will remain   in effect (meaning this test can be used) for the d uration of the COVID-19 declaration under Section 564(b)(1) of the Act, 21 U.S.C. section 360bbb-3(b)(1), unless the authorization is terminated or revoked sooner. Performed at [facility], [address], [place], [place] [address]   MRSA PCR Screening     Status: None   Collection Time: 01/26/19  3:30 AM   Specimen: Nasopharyngeal  Result Value Ref Range Status   MRSA by PCR NEGATIVE NEGATIVE Final    Comment:        The GeneXpert MRSA Assay (FDA approved for NASAL specimens only), is one component of a comprehensive MRSA colonization surveillance program. It is not intended to diagnose MRSA infection nor to guide or monitor treatment for MRSA infections. Performed at [facility], [address], [place], [place] [address]      Labs: BNP (last 3 results) Recent Labs    01/24/19 0424  BNP 60.0   Basic Metabolic Panel: Recent Labs  Lab 01/23/19 1146 01/24/19 0424 01/24/19 1745 01/25/19 0455 01/26/19 0155 01/27/19 0120  NA 136 137  --  140 139 137  K 3.8 3.9  --  4.1 3.7 3.9  CL 97* 101  --  104 103 103  CO2 25 23  --  GLUCOSE 112* 146*  --  137* 98 104*  BUN 10 13  --  19 19 24*  CREATININE 1.05 0.97 0.71 0.80 0.83 0.95  CALCIUM 8.5* 8.2*  --  8.6* 8.6* 8.3*  MG  --  2.3 2.2 2.2 2.2  --   PHOS  --  3.5 2.3* 3.4 3.3  --    Liver Function Tests: Recent Labs  Lab 01/23/19 1146 01/24/19 0424 01/25/19 0455 01/26/19 0155 01/27/19 0120  AST 29 32 44* 92* 39  ALT 23 28 44 139* 94*  ALKPHOS 68 65 65 72 67  BILITOT 0.5 0.7 0.2* 0.3 0.7  PROT 7.7 8.0 7.2 6.9 6.8  ALBUMIN 3.8 3.8 3.3* 3.3* 3.3*   No results for input(s): LIPASE, AMYLASE in the last 168 hours. No results for input(s): AMMONIA in the last 168 hours. CBC: Recent Labs  Lab 01/23/19 1146 01/24/19 0424 01/24/19 1745 01/25/19 0455 01/26/19 0155  WBC 7.8 5.7 10.0 12.7* 14.5*  NEUTROABS 6.5 4.6  --   10.6* 12.2*  HGB 14.6 13.8 13.6 13.9 13.9  HCT 42.3 41.5 40.3 41.7 42.5  MCV 82.5 85.7 86.7 87.1 88.4  PLT 185 211 229 271 313   Cardiac Enzymes: No results for input(s): CKTOTAL, CKMB, CKMBINDEX, TROPONINI in the last 168 hours. BNP: Invalid input(s): POCBNP CBG: Recent Labs  Lab 01/23/19 1916 01/24/19 0002 01/24/19 0126 01/24/19 0409 01/24/19 0816  GLUCAP 149* 130* 149* 128* 126*   D-Dimer Recent Labs  01/26/19 0155 01/27/19 0120  DDIMER 0.62* 0.53*   Hgb A1c No results for input(s): HGBA1C in the last 72 hours. Lipid Profile Recent Labs    01/25/19 0455  CHOL 137  HDL 25*  LDLCALC 100*  TRIG 61  CHOLHDL 5.5   Thyroid function studies No results for input(s): TSH, T4TOTAL, T3FREE, THYROIDAB in the last 72 hours.  Invalid input(s): FREET3 Anemia work up Recent Labs    01/26/19 0155 01/27/19 0120  FERRITIN 797* 691*   Urinalysis    Component Value Date/Time   COLORURINE YELLOW 11/02/2014 0641   APPEARANCEUR HAZY (A) 11/02/2014 0641   LABSPEC 1.018 11/02/2014 0641   PHURINE 8.5 (H) 11/02/2014 0641   GLUCOSEU NEGATIVE 11/02/2014 0641   HGBUR NEGATIVE 11/02/2014 0641   BILIRUBINUR NEGATIVE 11/02/2014 0641   KETONESUR NEGATIVE 11/02/2014 0641   PROTEINUR NEGATIVE 11/02/2014 0641   UROBILINOGEN 0.2 11/02/2014 0641   NITRITE NEGATIVE 11/02/2014 0641   LEUKOCYTESUR NEGATIVE 11/02/2014 0641   Sepsis Labs Invalid input(s): PROCALCITONIN,  WBC,  LACTICIDVEN Microbiology Recent Results (from the past 240 hour(s))  Blood Culture (routine x 2)     Status: None (Preliminary result)   Collection Time: 01/23/19 11:46 AM   Specimen: BLOOD  Result Value Ref Range Status   Specimen Description BLOOD LEFT ANTECUBITAL  Final   Special Requests   Final    BOTTLES DRAWN AEROBIC AND ANAEROBIC Blood Culture adequate volume   Culture   Final    NO GROWTH 4 DAYS Performed at [facility], [address] [place], [place] [address]    Report Status  PENDING  Incomplete  Blood Culture (routine x 2)     Status: None (Preliminary result)   Collection Time: 01/23/19 11:46 AM   Specimen: BLOOD  Result Value Ref Range Status   Specimen Description BLOOD RIGHT ANTECUBITAL  Final   Special Requests   Final    BOTTLES DRAWN AEROBIC AND ANAEROBIC Blood Culture results may not be optimal due to an excessive volume of blood received in culture bottles   Culture   Final    NO GROWTH 4 DAYS Performed at [facility], [address] [place], [place] [address]    Report Status PENDING  Incomplete  SARS CORONAVIRUS 2 (TAT 6-24 HRS) Nasopharyngeal Nasopharyngeal Swab     Status: Abnormal   Collection Time: 01/24/19  5:01 PM   Specimen: Nasopharyngeal Swab  Result Value Ref Range Status   SARS Coronavirus 2 POSITIVE (A) NEGATIVE Final    Comment: RESULT CALLED TO, READ BACK BY AND VERIFIED WITH: Debbie RN 15:30 01/25/19 ([email or web address]) (NOTE) SARS-CoV-2 target nucleic acids are DETECTED. The SARS-CoV-2 RNA is generally detectable in upper and lower respiratory specimens during the acute phase of infection. Positive results are indicative of the presence of SARS-CoV-2 RNA. Clinical correlation with patient history and other diagnostic information is  necessary to determine patient infection status. Positive results do not rule out bacterial infection or co-infection with other viruses.  The expected result is Negative. Fact Sheet for Patients: HairSlick.no Fact Sheet for Healthcare Providers: quierodirigir.com This test is not yet approved or cleared by the [place] FDA and  has been authorized for detection and/or diagnosis of SARS-CoV-2 by FDA under an Emergency Use Authorization (EUA). This EUA will remain  in effect (meaning this test can be used) for the d uration of the COVID-19 declaration under Section 564(b)(1) of the Act, 21 U.S.C. section 360bbb-3(b)(1), unless the  authorization is terminated or revoked  sooner. Performed at [facility], [address] [place], [place] [address]   MRSA PCR Screening     Status: None   Collection Time: 01/26/19  3:30 AM   Specimen: Nasopharyngeal  Result Value Ref Range Status   MRSA by PCR NEGATIVE NEGATIVE Final    Comment:        The GeneXpert MRSA Assay (FDA approved for NASAL specimens only), is one component of a comprehensive MRSA colonization surveillance program. It is not intended to diagnose MRSA infection nor to guide or monitor treatment for MRSA infections. Performed at [facility], [address] [place], [place] [address]      Time coordinating discharge: 45 minutes  SIGNED:   Coralie KeensMauricio Daniel , MD  [facility] 01/27/2019, 8:32 AM

## 2019-01-28 ENCOUNTER — Telehealth: Payer: Self-pay | Admitting: *Deleted

## 2019-01-28 LAB — CULTURE, BLOOD (ROUTINE X 2)
Culture: NO GROWTH
Culture: NO GROWTH
Special Requests: ADEQUATE

## 2019-01-28 NOTE — Telephone Encounter (Signed)
Called and LVM stating nurse was f/u after patient's hospital discharge. Nurse explained that the patient's instructions stated for them to make an appointment with PCP and nurse wanted to ensure they did not have any questions or concerns regarding their discharge. Nurse left her callback number for patient to reach her and she will call patient back later.   

## 2019-01-31 NOTE — Telephone Encounter (Signed)
Called and LVM stating nurse was f/u after patient's hospital discharge. Nurse sees where patient has a hospital f/u appointment scheduled for 02/10/19. Nurse requested that patient call her back for any questions or concerns regarding their discharge. Nurse left her callback number for patient to reach her.

## 2019-02-04 ENCOUNTER — Telehealth: Payer: Self-pay | Admitting: Internal Medicine

## 2019-02-04 ENCOUNTER — Encounter (INDEPENDENT_AMBULATORY_CARE_PROVIDER_SITE_OTHER): Payer: Self-pay

## 2019-02-04 ENCOUNTER — Telehealth: Payer: Self-pay

## 2019-02-04 NOTE — Telephone Encounter (Signed)
Can we move him over to Dr Bary Leriche schedule?

## 2019-02-04 NOTE — Telephone Encounter (Signed)
Lm for pt to call back and schedule on Dr. Bary Leriche schedule for next week

## 2019-02-04 NOTE — Telephone Encounter (Signed)
Patient states that he is not having any symptoms and that this questionnaire is old. Patient was still advise on the diarrhea and steps to follow.

## 2019-02-04 NOTE — Telephone Encounter (Signed)
FYI: [facility] scheduled a hospital follow up for this patient on Dr. Lupita Dawn schedule, (02/10/2019).

## 2019-02-10 ENCOUNTER — Ambulatory Visit (INDEPENDENT_AMBULATORY_CARE_PROVIDER_SITE_OTHER): Payer: PRIVATE HEALTH INSURANCE | Admitting: Internal Medicine

## 2019-02-10 ENCOUNTER — Other Ambulatory Visit: Payer: Self-pay

## 2019-02-10 ENCOUNTER — Encounter: Payer: Self-pay | Admitting: Internal Medicine

## 2019-02-10 DIAGNOSIS — U071 COVID-19: Secondary | ICD-10-CM | POA: Diagnosis not present

## 2019-02-10 DIAGNOSIS — G4734 Idiopathic sleep related nonobstructive alveolar hypoventilation: Secondary | ICD-10-CM

## 2019-02-10 DIAGNOSIS — J1289 Other viral pneumonia: Secondary | ICD-10-CM | POA: Diagnosis not present

## 2019-02-10 DIAGNOSIS — J1282 Pneumonia due to coronavirus disease 2019: Secondary | ICD-10-CM

## 2019-02-10 DIAGNOSIS — J069 Acute upper respiratory infection, unspecified: Secondary | ICD-10-CM | POA: Diagnosis not present

## 2019-02-10 NOTE — Progress Notes (Signed)
Virtual Visit via [email or web address]  This visit type was conducted due to national recommendations for restrictions regarding the COVID-19 pandemic (e.g. social distancing).  This format is felt to be most appropriate for this patient at this time.  All issues noted in this document were discussed and addressed.  No physical exam was performed (except for noted visual exam findings with Video Visits).   I connected with@ on 02/10/19 at 11:00 AM EST by a video enabled telemedicine application.  Interactive audio and video telecommunications were initially established beteen this provider and patient, however ultimately failed, due to patient having technical difficulties. We continued and completed visit with audio only  and verified that I am speaking with the correct person using two identifiers  and verified that I am speaking with the correct person using two identifiers.  Location patient: home Location provider: work or home office Persons participating in the virtual visit: patient, provider  I discussed the limitations, risks, security and privacy concerns of performing an evaluation and management service by telephone and the availability of in person appointments. I also discussed with the patient that there may be a patient responsible charge related to this service. The patient expressed understanding and agreed to proceed.  Reason for visit: hospital follow up/COVID infection and bilateral pneumonia   HPI:  48 yr old diagnosed with  COVID 9 INFECTION ON NOV 26, admitted to [facility]  On Nov 29 with shortness of breath and a syncopal event while in the ER waiting room.  He was hospitalized for 5 days with acute hypoxic  respiratory failure secondary to viral pneumonia with bilateral infiltrates .  He received supplemental oxygen, iv fluids,  dexamethasone remdesivir and 1 dose of actemra before being discharged to home in improved condition on  Dec 3.  Symptoms improving,  Dyspnea, headaches and fever  resolved . Energy level improving,  Doing the breathing treatments with the flutter valve and able to move  the ball to the top .  Appetite improving  after a weight loss of 20 lbs as of 4 days ago.    Works for himself , has a Sports administrator ,    The diagnosis of Sleep apnea nocturnal was raised due to the observation of  desats noted while in house. .  Discussed rechecking in one moth with Dr Nicki Reaper   ROS: See pertinent positives and negatives per HPI.  Past Medical History:  Diagnosis Date  . Acute respiratory failure with hypoxia ([place])   . 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 Degenerative disc disease    cervical disc disease  . GERD (gastroesophageal reflux disease)     Past Surgical History:  Procedure Laterality Date  . COLONOSCOPY    . UPPER GI ENDOSCOPY      Family History  Problem Relation Age of Onset  . Hyperlipidemia Father   . Heart disease Father        s/p CABG and stent  . Stroke Father   . Hypertension Father   . Colon cancer Neg Hx   . Prostate cancer Neg Hx     SOCIAL HX:  reports that he has never smoked. He has never used smokeless tobacco. He reports current alcohol use. He reports that he does not use drugs.   Current Outpatient Medications:  .  Ascorbic Acid (VITAMIN C) 100 MG tablet, Take 100 mg by mouth daily., Disp: , Rfl:  .  cholecalciferol (VITAMIN D3) 25 MCG (1000 UT) tablet, Take 1,000 Units by mouth daily., Disp: , Rfl:  .  zinc gluconate 50 MG tablet, Take 50 mg by mouth daily., Disp: , Rfl:   EXAM:   General impression: alert, cooperative and articulate.  No signs of being in distress  Lungs: speech is fluent sentence length suggests that patient is not short of breath and not punctuated by cough, sneezing or sniffing. Marland Kitchen   Psych: affect normal.  speech is articulate and non pressured .  Denies suicidal thoughts   ASSESSMENT AND  PLAN:  Discussed the following assessment and plan:  Acute respiratory disease due to COVID-19 virus  Nocturnal oxygen desaturation  Pneumonia due to COVID-19 virus  Acute respiratory disease due to COVID-19 virus He is no longer hypoxic or short of breath  Nocturnal oxygen desaturation Discovered during hospitalization for acute hypoxic respiratory failure  And suspected to be OSA due to BMI.  Advised him to postpone testing for sleep apnea until acute illness has resolved.   Pneumonia due to COVID-19 virus Clinically resolved .  He  Was diagnosed on Nov 29 and completed treatment with Remdesivir and received one dose of Actemra .  All symptoms are either improving or resolved.     I discussed the assessment and treatment plan with the patient. The patient was provided an opportunity to ask questions and all were answered. The patient agreed with the plan and demonstrated an understanding of the instructions.   The patient was advised to call back or seek an in-person evaluation if the symptoms worsen or if the condition fails to improve as anticipated.   I provided  25 minutes of non-face-to-face time during this encounter reviewing patient's current problems and past procedures/imaging studies, providing counseling on the above mentioned problems , and coordination  of care . Sherlene Shams, MD

## 2019-02-12 ENCOUNTER — Encounter: Payer: Self-pay | Admitting: Internal Medicine

## 2019-02-12 DIAGNOSIS — G4734 Idiopathic sleep related nonobstructive alveolar hypoventilation: Secondary | ICD-10-CM | POA: Insufficient documentation

## 2019-02-12 NOTE — Assessment & Plan Note (Signed)
Discovered during hospitalization for acute hypoxic respiratory failure  And suspected to be OSA due to BMI.  Advised him to postpone testing for sleep apnea until acute illness has resolved.

## 2019-02-12 NOTE — Assessment & Plan Note (Signed)
He is no longer hypoxic or short of breath

## 2019-02-12 NOTE — Assessment & Plan Note (Addendum)
Clinically resolved .  He  Was diagnosed on Nov 29 and completed treatment with Remdesivir and received one dose of Actemra .  All symptoms are either improving or resolved.

## 2019-02-25 DIAGNOSIS — J9601 Acute respiratory failure with hypoxia: Secondary | ICD-10-CM

## 2019-02-25 HISTORY — DX: Acute respiratory failure with hypoxia: J96.01

## 2019-03-25 ENCOUNTER — Encounter: Payer: Self-pay | Admitting: Internal Medicine

## 2019-03-25 ENCOUNTER — Ambulatory Visit (INDEPENDENT_AMBULATORY_CARE_PROVIDER_SITE_OTHER): Payer: PRIVATE HEALTH INSURANCE | Admitting: Internal Medicine

## 2019-03-25 ENCOUNTER — Other Ambulatory Visit: Payer: Self-pay

## 2019-03-25 VITALS — Ht 69.0 in | Wt 215.0 lb

## 2019-03-25 DIAGNOSIS — R5383 Other fatigue: Secondary | ICD-10-CM | POA: Diagnosis not present

## 2019-03-25 DIAGNOSIS — K219 Gastro-esophageal reflux disease without esophagitis: Secondary | ICD-10-CM | POA: Diagnosis not present

## 2019-03-25 DIAGNOSIS — U071 COVID-19: Secondary | ICD-10-CM

## 2019-03-25 DIAGNOSIS — E7800 Pure hypercholesterolemia, unspecified: Secondary | ICD-10-CM | POA: Diagnosis not present

## 2019-03-25 DIAGNOSIS — Z125 Encounter for screening for malignant neoplasm of prostate: Secondary | ICD-10-CM

## 2019-03-25 DIAGNOSIS — J069 Acute upper respiratory infection, unspecified: Secondary | ICD-10-CM

## 2019-03-25 NOTE — Progress Notes (Signed)
Patient ID: Devin Humphrey, male   DOB: 08-14-1970, 49 y.o.   MRN: [id number]   Virtual Visit via video Note  This visit type was conducted due to national recommendations for restrictions regarding the COVID-19 pandemic (e.g. social distancing).  This format is felt to be most appropriate for this patient at this time.  All issues noted in this document were discussed and addressed.  No physical exam was performed (except for noted visual exam findings with Video Visits).   I connected with Jacinto Halim by a video enabled telemedicine application and verified that I am speaking with the correct person using two identifiers. Location patient: home Location provider: work Persons participating in the virtual visit: patient, provider  The limitations, risks, security and privacy concerns of performing an evaluation and management service by video and the availability of in person appointments have been discussed.  The patient expressed understanding and agreed to proceed.   Reason for visit: scheduled follow up.   HPI: Was admitted 01/24/19 - 01/27/19 with covid - acute hypoxic respiratory failure due to covid viral pneumonia.  Treated with dexamethasone, remdesivir and one dose of actemra.  Improved - discharged home.  Denies any significant residual problems from covid.  Still some taste and smell issues.  Is eating.  No nausea or vomiting.  No abdominal pain reported.  Tries to stay active.  No chest pain or sob.  No chest congestion.  Bowels stable.  Discussed colonoscopy.  Last 2013 - 2014.  Does report increased fatigue.  Some daytime somnolence.  Wife has noticed questionable apneic episodes.  Discussed possible sleep apnea.  Discussed evaluation.  Agreeable.     ROS: See pertinent positives and negatives per HPI.  Past Medical History:  Diagnosis Date  . Acute respiratory failure with hypoxia ([place])   . 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 Degenerative disc disease    cervical disc disease  . GERD (gastroesophageal reflux disease)     Past Surgical History:  Procedure Laterality Date  . COLONOSCOPY    . UPPER GI ENDOSCOPY      Family History  Problem Relation Age of Onset  . Hyperlipidemia Father   . Heart disease Father        s/p CABG and stent  . Stroke Father   . Hypertension Father   . Colon cancer Neg Hx   . Prostate cancer Neg Hx     SOCIAL HX: reviewed.    Current Outpatient Medications:  .  Ascorbic Acid (VITAMIN C) 100 MG tablet, Take 100 mg by mouth daily., Disp: , Rfl:  .  cholecalciferol (VITAMIN D3) 25 MCG (1000 UT) tablet, Take 1,000 Units by mouth daily., Disp: , Rfl:  .  zinc gluconate 50 MG tablet, Take 50 mg by mouth daily., Disp: , Rfl:   EXAM:  GENERAL: alert, oriented, appears well and in no acute distress  HEENT: atraumatic, conjunttiva clear, no obvious abnormalities on inspection of external nose and ears  NECK: normal movements of the head and neck  LUNGS: on inspection no signs of respiratory distress, breathing rate appears normal, no obvious gross SOB, gasping or wheezing  CV: no obvious cyanosis  PSYCH/NEURO: pleasant and cooperative, no obvious depression or anxiety, speech and thought processing grossly intact  ASSESSMENT AND PLAN:  Discussed the following assessment and plan:  Acute respiratory disease due to COVID-19 virus Admitted with covid - hypoxic respiratory  failure.  cxr with covid pneumonia.  Will recheck cxr to confirm clear.    Hypercholesterolemia Low cholesterol diet and exercise.  Follow lipid panel.   Fatigue Increased fatigue.  Daytime somnolence.  Question of apnea - per wife.  Check routine labs.  refeer to pulmonary for further evaluation and w/up for possible sleep apnea.    GERD (gastroesophageal reflux disease) No acid reflux symptoms reported.     Orders Placed This Encounter  Procedures  . CBC with  Differential/Platelet    Standing Status:   Future    Standing Expiration Date:   03/26/2020  . Comprehensive metabolic panel    Standing Status:   Future    Standing Expiration Date:   03/26/2020  . Lipid panel    Standing Status:   Future    Standing Expiration Date:   03/26/2020  . PSA    Standing Status:   Future    Standing Expiration Date:   03/26/2020  . TSH    Standing Status:   Future    Standing Expiration Date:   03/26/2020     I discussed the assessment and treatment plan with the patient. The patient was provided an opportunity to ask questions and all were answered. The patient agreed with the plan and demonstrated an understanding of the instructions.   The patient was advised to call back or seek an in-person evaluation if the symptoms worsen or if the condition fails to improve as anticipated.   Dale Raynham, MD

## 2019-03-27 ENCOUNTER — Encounter: Payer: Self-pay | Admitting: Internal Medicine

## 2019-03-27 DIAGNOSIS — R5383 Other fatigue: Secondary | ICD-10-CM | POA: Insufficient documentation

## 2019-03-27 NOTE — Assessment & Plan Note (Signed)
Low cholesterol diet and exercise.  Follow lipid panel.   

## 2019-03-27 NOTE — Assessment & Plan Note (Signed)
Increased fatigue.  Daytime somnolence.  Question of apnea - per wife.  Check routine labs.  refeer to pulmonary for further evaluation and w/up for possible sleep apnea.

## 2019-03-27 NOTE — Assessment & Plan Note (Signed)
Admitted with covid - hypoxic respiratory failure.  cxr with covid pneumonia.  Will recheck cxr to confirm clear.

## 2019-03-27 NOTE — Assessment & Plan Note (Signed)
No acid reflux symptoms reported.  

## 2019-03-30 ENCOUNTER — Ambulatory Visit (INDEPENDENT_AMBULATORY_CARE_PROVIDER_SITE_OTHER): Payer: PRIVATE HEALTH INSURANCE

## 2019-03-30 ENCOUNTER — Other Ambulatory Visit: Payer: Self-pay

## 2019-03-30 ENCOUNTER — Other Ambulatory Visit (INDEPENDENT_AMBULATORY_CARE_PROVIDER_SITE_OTHER): Payer: PRIVATE HEALTH INSURANCE

## 2019-03-30 DIAGNOSIS — E7800 Pure hypercholesterolemia, unspecified: Secondary | ICD-10-CM | POA: Diagnosis not present

## 2019-03-30 DIAGNOSIS — R5383 Other fatigue: Secondary | ICD-10-CM

## 2019-03-30 DIAGNOSIS — J069 Acute upper respiratory infection, unspecified: Secondary | ICD-10-CM | POA: Diagnosis not present

## 2019-03-30 DIAGNOSIS — U071 COVID-19: Secondary | ICD-10-CM | POA: Diagnosis not present

## 2019-03-30 DIAGNOSIS — Z125 Encounter for screening for malignant neoplasm of prostate: Secondary | ICD-10-CM | POA: Diagnosis not present

## 2019-03-30 NOTE — Addendum Note (Signed)
Addended by: Warden Fillers on: 03/30/2019 03:01 PM   Modules accepted: Orders

## 2019-03-31 ENCOUNTER — Encounter: Payer: Self-pay | Admitting: Internal Medicine

## 2019-03-31 LAB — COMPREHENSIVE METABOLIC PANEL
ALT: 34 U/L (ref 0–53)
AST: 26 U/L (ref 0–37)
Albumin: 4.6 g/dL (ref 3.5–5.2)
Alkaline Phosphatase: 76 U/L (ref 39–117)
BUN: 17 mg/dL (ref 6–23)
CO2: 25 mEq/L (ref 19–32)
Calcium: 9.4 mg/dL (ref 8.4–10.5)
Chloride: 103 mEq/L (ref 96–112)
Creatinine, Ser: 1 mg/dL (ref 0.40–1.50)
GFR: 80 mL/min (ref >60.00)
Glucose, Bld: 74 mg/dL (ref 70–99)
Potassium: 3.9 mEq/L (ref 3.5–5.1)
Sodium: 138 mEq/L (ref 135–145)
Total Bilirubin: 0.5 mg/dL (ref 0.2–1.2)
Total Protein: 7.1 g/dL (ref 6.0–8.3)

## 2019-03-31 LAB — CBC WITH DIFFERENTIAL/PLATELET
Basophils Absolute: 0.1 10*3/uL (ref 0.0–0.1)
Basophils Relative: 0.9 % (ref 0.0–3.0)
Eosinophils Absolute: 0 10*3/uL (ref 0.0–0.7)
Eosinophils Relative: 0.5 % (ref 0.0–5.0)
HCT: 45.6 % (ref 39.0–52.0)
Hemoglobin: 15.3 g/dL (ref 13.0–17.0)
Lymphocytes Relative: 28.4 % (ref 12.0–46.0)
Lymphs Abs: 2.7 10*3/uL (ref 0.7–4.0)
MCHC: 33.6 g/dL (ref 30.0–36.0)
MCV: 87.2 fl (ref 78.0–100.0)
Monocytes Absolute: 0.7 10*3/uL (ref 0.1–1.0)
Monocytes Relative: 6.9 % (ref 3.0–12.0)
Neutro Abs: 6.1 10*3/uL (ref 1.4–7.7)
Neutrophils Relative %: 63.3 % (ref 43.0–77.0)
Platelets: 302 10*3/uL (ref 150.0–400.0)
RBC: 5.23 Mil/uL (ref 4.22–5.81)
RDW: 14.9 % (ref 11.5–15.5)
WBC: 9.6 10*3/uL (ref 4.0–10.5)

## 2019-03-31 LAB — LIPID PANEL
Cholesterol: 229 mg/dL — ABNORMAL HIGH (ref 0–200)
HDL: 29.8 mg/dL — ABNORMAL LOW (ref >39.00)
NonHDL: 198.71
Total CHOL/HDL Ratio: 8
Triglycerides: 251 mg/dL — ABNORMAL HIGH (ref 0.0–149.0)
VLDL: 50.2 mg/dL — ABNORMAL HIGH (ref 0.0–40.0)

## 2019-03-31 LAB — PSA: PSA: 0.74 ng/mL (ref 0.10–4.00)

## 2019-03-31 LAB — TSH: TSH: 2.29 u[IU]/mL (ref 0.35–4.50)

## 2019-03-31 LAB — LDL CHOLESTEROL, DIRECT: Direct LDL: 156 mg/dL

## 2019-04-01 ENCOUNTER — Encounter: Payer: Self-pay | Admitting: Internal Medicine

## 2019-04-25 ENCOUNTER — Encounter: Payer: Self-pay | Admitting: Internal Medicine

## 2019-04-25 DIAGNOSIS — R5383 Other fatigue: Secondary | ICD-10-CM

## 2019-04-25 DIAGNOSIS — R400 Somnolence: Secondary | ICD-10-CM

## 2019-04-27 NOTE — Telephone Encounter (Signed)
Order placed for pulmonary referral.  

## 2019-05-12 ENCOUNTER — Other Ambulatory Visit: Payer: Self-pay

## 2019-05-12 ENCOUNTER — Encounter: Payer: Self-pay | Admitting: Internal Medicine

## 2019-05-12 ENCOUNTER — Ambulatory Visit (INDEPENDENT_AMBULATORY_CARE_PROVIDER_SITE_OTHER): Payer: PRIVATE HEALTH INSURANCE | Admitting: Internal Medicine

## 2019-05-12 VITALS — BP 100/62 | HR 97 | Temp 98.6°F | Ht 65.5 in | Wt 216.0 lb

## 2019-05-12 DIAGNOSIS — G4719 Other hypersomnia: Secondary | ICD-10-CM | POA: Diagnosis not present

## 2019-05-12 NOTE — Patient Instructions (Signed)
OBTAIN HOME SLEEP STUDY 

## 2019-05-12 NOTE — Progress Notes (Signed)
Name: Devin Humphrey MRN: [id number] DOB: 01-02-1971     CONSULTATION DATE: 05/12/2019  REFERRING MD : Lorin Picket  CHIEF COMPLAINT: excessive daytime sleepiness   HISTORY OF PRESENT ILLNESS:  Patient is seen today for problems and issues with sleep related to excessive daytime sleepiness Patient  has been having sleep problems for many years Patient has been having excessive daytime sleepiness for a long time Patient has been having extreme fatigue and tiredness, lack of energy +  very Loud snoring every night + struggling breathe at night and gasps for air   Discussed sleep data and reviewed with patient.  Encouraged proper weight management.  Discussed driving precautions and its relationship with hypersomnolence.  Discussed operating dangerous equipment and its relationship with hypersomnolence.  Discussed sleep hygiene, and benefits of a fixed sleep waked time.  The importance of getting eight or more hours of sleep discussed with patient.  Discussed limiting the use of the computer and television before bedtime.  Decrease naps during the day, so night time sleep will become enhanced.  Limit caffeine, and sleep deprivation.  HTN, stroke, and heart failure are potential risk factors.    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  has a past surgical history that includes Colonoscopy and Upper gi endoscopy. Prior to Admission medications   Medication Sig Start Date End Date Taking? Authorizing Provider  Ascorbic Acid (VITAMIN C) 100 MG tablet Take 100 mg by mouth daily.    [provider]  cholecalciferol (VITAMIN D3) 25 MCG (1000 UT) tablet Take 1,000 Units by mouth daily.    [provider]  zinc  gluconate 50 MG tablet Take 50 mg by mouth daily.    [provider]   No Known Allergies  FAMILY HISTORY:  family history includes Heart disease in his father; Hyperlipidemia in his father; Hypertension in his father; Stroke in his father. SOCIAL HISTORY:  reports that he has never smoked. He has never used smokeless tobacco. He reports current alcohol use. He reports that he does not use drugs.  REVIEW OF SYSTEMS:   Constitutional: Negative for fever, chills, weight loss, malaise/fatigue and diaphoresis.  HENT: Negative for hearing loss, ear pain, nosebleeds, congestion, sore throat, neck pain, tinnitus and ear discharge.   Eyes: Negative for blurred vision, double vision, photophobia, pain, discharge and redness.  Respiratory: Negative for cough, hemoptysis, sputum production, shortness of breath, wheezing and stridor.   Cardiovascular: Negative for chest pain, palpitations, orthopnea, claudication, leg swelling and PND.  Gastrointestinal: Negative for heartburn, nausea, vomiting, abdominal pain, diarrhea, constipation, blood in stool and melena.  Genitourinary: Negative for dysuria, urgency, frequency, hematuria and flank pain.  Musculoskeletal: Negative for myalgias, back pain, joint pain and falls.  Skin: Negative for itching and rash.  Neurological: Negative for dizziness, tingling, tremors, sensory change, speech change, focal weakness, seizures, loss of consciousness, weakness and headaches.  Endo/Heme/Allergies: Negative for environmental allergies and polydipsia. Does not bruise/bleed easily.  ALL OTHER ROS ARE NEGATIVE    BP 100/62 (BP Location: Left Arm, Cuff Size: Normal)   Pulse 97   Temp 98.6 F (37 C) (Temporal)   Ht 5' 5.5" (1.664 m)   Wt 216 lb (98 kg)   SpO2 97%   BMI 35.40 kg/m  Physical Examination:   General Appearance: No distress  Neuro:without focal findings,  speech normal,  HEENT: PERRLA, EOM intact.   Pulmonary: normal breath sounds,  No wheezing.  CardiovascularNormal S1,S2.  No m/r/g.   Abdomen: Benign, Soft, non-tender. Renal:  No costovertebral tenderness  GU:  Not performed at this time. Endoc: No evident thyromegaly, no signs of acromegaly. Skin:   warm, no rashes, no ecchymosis  Extremities: normal, no cyanosis, clubbing. NEUROLOGIC: Cranial nerves II through XII are intact. No gross focal neurological deficits.  PSYCHIATRIC: Mood, affect within normal limits.      ASSESSMENT AND PLAN SYNOPSIS  49 year old obese white male seen today for signs and symptoms of sleep apnea  At this time I recommend obtaining sleep study for definitive diagnosis  Overweight -recommend weight loss -recommend changing diet  Deconditioned state -Recommend increased daily activity and exercise    TOTAL TIME 30 mins  COVID-19 EDUCATION: The signs and symptoms of COVID-19 were discussed with the patient and how to seek care for testing.  The importance of social distancing was discussed today. Hand Washing Techniques and avoid touching face was advised.     MEDICATION ADJUSTMENTS/LABS AND TESTS ORDERED: Sleep Study needed for further assessment   CURRENT MEDICATIONS REVIEWED AT LENGTH WITH PATIENT TODAY   Patient satisfied with Plan of action and management. All questions answered  Follow up in 3-6 months   Kathlyn Leachman Patricia Pesa, M.D.  [facility]  Medical Director [place] Director [facility] Cardio-Pulmonary Department

## 2019-06-15 ENCOUNTER — Other Ambulatory Visit: Payer: Self-pay

## 2019-06-15 ENCOUNTER — Ambulatory Visit: Payer: PRIVATE HEALTH INSURANCE

## 2019-06-15 DIAGNOSIS — G4733 Obstructive sleep apnea (adult) (pediatric): Secondary | ICD-10-CM

## 2019-06-15 DIAGNOSIS — G4719 Other hypersomnia: Secondary | ICD-10-CM

## 2019-06-17 ENCOUNTER — Telehealth: Payer: Self-pay | Admitting: Pulmonary Disease

## 2019-06-17 DIAGNOSIS — G4733 Obstructive sleep apnea (adult) (pediatric): Secondary | ICD-10-CM

## 2019-06-17 NOTE — Telephone Encounter (Signed)
Home sleep study from 06/15/19 shows moderate to severe obstructive sleep apnea with an AHI of 29.5 and SpO2 low of 75%.   Will route to Dr. Belia Heman to follow up with patient.

## 2019-06-27 ENCOUNTER — Telehealth: Payer: Self-pay | Admitting: Internal Medicine

## 2019-06-27 DIAGNOSIS — G4733 Obstructive sleep apnea (adult) (pediatric): Secondary | ICD-10-CM

## 2019-06-27 NOTE — Telephone Encounter (Signed)
Dr. Belia Heman please advise on patients sleep study and if he needs an appointment to follow up  Pt states that he had his sleep study on 06/15/2019, he hasn't received his results yet. But he got a notification to set up an appt. He said that DK told him that he would contact him to let him know of his results, and to go over what needs to be done. Then, he would set up an appt in a month or so after that to see how he is doing. He is due for an appt in June.

## 2019-06-27 NOTE — Telephone Encounter (Signed)
I DO NOT SEE RESULTS ON HIS TEST  PLEASE VERIFY

## 2019-06-27 NOTE — Telephone Encounter (Signed)
Pt states that he had his sleep study on 06/15/2019, he hasn't received his results yet. But he got a notification to set up an appt. He said that DK told him that he would contact him to let him know of his results, and to go over what needs to be done. Then, he would set up an appt in a month or so after that to see how he is doing. He is due for an appt in June.

## 2019-06-27 NOTE — Telephone Encounter (Signed)
Looks like there is a message from 4/23 from Dr. Craige Cotta with results and said that he would forward to you to follow up.

## 2019-06-28 NOTE — Telephone Encounter (Signed)
Sleep Study is now scanned into chart under media

## 2019-06-28 NOTE — Telephone Encounter (Signed)
Home sleep study from 06/15/19 shows moderate to severe obstructive sleep apnea with an AHI of 29.5 and SpO2 low of 75%.     DX SEVERE SLEEP APNEA  RECOMMEND AUTOCPAP 5-12 cm H20

## 2019-06-28 NOTE — Telephone Encounter (Signed)
Spoke with Synetta Fail and was told that it has been read not sure why it is not scanned into Epic I printed a new copy and waiting for. Dr. to sign and then I will scan it into Epic.  Message from Dr. Craige Cotta on 4/23: Home sleep study from 06/15/19 shows moderate to severe obstructive sleep apnea with an AHI of 29.5 and SpO2 low of 75%.  Will route to Dr. Belia Heman to follow up with patient.  Dr. Belia Heman we are waiting for it to be scanned into Epic but here is the message from [place].

## 2019-06-28 NOTE — Telephone Encounter (Signed)
ATC patient left detailed message about sleep study result per patients DPR. LMTCB if patient wants to proceed with CPAP. Once he calls back the order will be placed

## 2019-06-28 NOTE — Telephone Encounter (Signed)
I am not able to view any messages from Dr Craige Cotta, can you see the message?

## 2019-06-29 NOTE — Telephone Encounter (Signed)
Called and spoke with patient to go over recs from Dr. Belia Heman. Patient would like to proceed with CPAP order. Order has been placed. Nothing further needed at this time

## 2019-07-06 ENCOUNTER — Encounter: Payer: Self-pay | Admitting: Internal Medicine

## 2019-07-15 ENCOUNTER — Other Ambulatory Visit: Payer: Self-pay

## 2019-07-15 ENCOUNTER — Encounter: Payer: Self-pay | Admitting: Internal Medicine

## 2019-07-15 ENCOUNTER — Telehealth (INDEPENDENT_AMBULATORY_CARE_PROVIDER_SITE_OTHER): Payer: PRIVATE HEALTH INSURANCE | Admitting: Internal Medicine

## 2019-07-15 DIAGNOSIS — Z713 Dietary counseling and surveillance: Secondary | ICD-10-CM | POA: Diagnosis not present

## 2019-07-15 DIAGNOSIS — E7800 Pure hypercholesterolemia, unspecified: Secondary | ICD-10-CM | POA: Diagnosis not present

## 2019-07-15 NOTE — Progress Notes (Signed)
Patient ID: Devin Humphrey, male   DOB: 1970-06-17, 49 y.o.   MRN: [id number]   Virtual Visit via video Note  This visit type was conducted due to national recommendations for restrictions regarding the COVID-19 pandemic (e.g. social distancing).  This format is felt to be most appropriate for this patient at this time.  All issues noted in this document were discussed and addressed.  No physical exam was performed (except for noted visual exam findings with Video Visits).   I connected with Devin Humphrey by a video enabled telemedicine application and verified that I am speaking with the correct person using two identifiers. Location patient: home Location provider: work  Persons participating in the virtual visit: patient, provider  The limitations, risks, security and privacy concerns of performing an evaluation and management service by video and the availability of in person appointments have been discussed.  It has also been discussed with the patient that there may be a patient responsible charge related to this service. The patient has expressed understanding and has agreed to proceed.   Reason for visit: work in appt   HPI: Scheduled for work in appt to discuss weight loss medications. He has adjusted his diet.  Decreased intake if hamburger and pizza.  Is walking.  Discussed diet and exercise.  Not losing weight.  Would like medication to help curb his appetite.  Tries to stay active.  No chest pain or sob reported.  Had covid back in the fall.  Recovered from this.  Energy is better.  No acid reflux or abdominal pain reported.  Bowels stable.  Discussed weight loss medication.  Discussed varied treatment options.  He is interested in saxenda.  Has no medication coverage (with insurance).     ROS: See pertinent positives and negatives per HPI.  Past Medical History:  Diagnosis Date  . Acute respiratory failure with hypoxia ([place])   . CONTACT DERMATITIS&OTHER ECZEMA DUE TO  PLANTS 08/19/2009   Qualifier: Diagnosis of  By: Alveta Heimlich MD, Cornelia Copa    . Degenerative disc disease    cervical disc disease  . GERD (gastroesophageal reflux disease)     Past Surgical History:  Procedure Laterality Date  . COLONOSCOPY    . UPPER GI ENDOSCOPY      Family History  Problem Relation Age of Onset  . Hyperlipidemia Father   . Heart disease Father        s/p CABG and stent  . Stroke Father   . Hypertension Father   . Colon cancer Neg Hx   . Prostate cancer Neg Hx     SOCIAL HX: reviewed.    Current Outpatient Medications:  .  Ascorbic Acid (VITAMIN C) 100 MG tablet, Take 100 mg by mouth daily., Disp: , Rfl:  .  cholecalciferol (VITAMIN D3) 25 MCG (1000 UT) tablet, Take 1,000 Units by mouth daily., Disp: , Rfl:  .  zinc gluconate 50 MG tablet, Take 50 mg by mouth daily., Disp: , Rfl:   EXAM:  GENERAL: alert, oriented, appears well and in no acute distress  HEENT: atraumatic, conjunttiva clear, no obvious abnormalities on inspection of external nose and ears  NECK: normal movements of the head and neck  LUNGS: on inspection no signs of respiratory distress, breathing rate appears normal, no obvious gross SOB, gasping or wheezing  CV: no obvious cyanosis  PSYCH/NEURO: pleasant and cooperative, no obvious depression or anxiety, speech and thought processing grossly intact  ASSESSMENT AND PLAN:  Discussed the following assessment and  plan:  Hypercholesterolemia Low cholesterol diet and exercise.  Follow lipid panel.   Weight loss counseling, encounter for Discussed diet and exercise.  He has adjusted his diet.  Is walking.  Discussed treatment options.  He prefers saxenda.  Has no medication coverage (with his insurance).  Will place CCM referral.     Orders Placed This Encounter  Procedures  . Ambulatory referral to Chronic Care Management Services    Referral Priority:   Routine    Referral Type:   Consultation    Referral Reason:   Care Coordination      Number of Visits Requested:   1     I discussed the assessment and treatment plan with the patient. The patient was provided an opportunity to ask questions and all were answered. The patient agreed with the plan and demonstrated an understanding of the instructions.   The patient was advised to call back or seek an in-person evaluation if the symptoms worsen or if the condition fails to improve as anticipated.   Dale [place], MD

## 2019-07-17 ENCOUNTER — Encounter: Payer: Self-pay | Admitting: Internal Medicine

## 2019-07-17 DIAGNOSIS — Z713 Dietary counseling and surveillance: Secondary | ICD-10-CM | POA: Insufficient documentation

## 2019-07-17 NOTE — Assessment & Plan Note (Signed)
Discussed diet and exercise.  He has adjusted his diet.  Is walking.  Discussed treatment options.  He prefers saxenda.  Has no medication coverage (with his insurance).  Will place CCM referral.

## 2019-07-17 NOTE — Assessment & Plan Note (Signed)
Low cholesterol diet and exercise.  Follow lipid panel.   

## 2019-07-18 ENCOUNTER — Telehealth: Payer: Self-pay | Admitting: Internal Medicine

## 2019-07-18 NOTE — Chronic Care Management (AMB) (Signed)
  Care Management   Note  07/18/2019 Name: KALYAN BARABAS MRN: [id number] DOB: [birth year]  TAJAI IHDE is a 49 y.o. year old male who is a primary care patient of Dale [place], MD. I reached out to Cyndi Lennert by phone today in response to a referral sent by Mr. Charlane Ferretti [place]'s health plan.    Mr. Browe was given information about care management services today including:  1. Care management services include personalized support from designated clinical staff supervised by his physician, including individualized plan of care and coordination with other care providers 2. 24/7 contact phone numbers for assistance for urgent and routine care needs. 3. The patient may stop care management services at any time by phone call to the office staff.  Patient agreed to services and verbal consent obtained.   Follow up plan: Telephone appointment with care management team member scheduled for:08/12/2019  Elisha Ponder, LPN Health Advisor, Embedded Care Coordination [facility] Health Care Management [email or web address] ??[phone]

## 2019-08-12 ENCOUNTER — Ambulatory Visit: Payer: PRIVATE HEALTH INSURANCE | Admitting: Pharmacist

## 2019-08-12 DIAGNOSIS — E669 Obesity, unspecified: Secondary | ICD-10-CM

## 2019-08-12 DIAGNOSIS — E7800 Pure hypercholesterolemia, unspecified: Secondary | ICD-10-CM

## 2019-08-12 DIAGNOSIS — Z713 Dietary counseling and surveillance: Secondary | ICD-10-CM

## 2019-08-12 NOTE — Patient Instructions (Signed)
Visit Information  Goals Addressed              This Visit's Progress     Patient Stated   .  PharmD "I want to work on weight loss" (pt-stated)        [place] (see longitudinal plan of care for additional care plan information)  Current Barriers:  . Obese; complicated by chronic medical conditions including s/p COVID, most recent BMI 35.4, previous BMI in 02/2019 31.75 . Patient does NOT have prescription insurance . Reports that he has been committed to diet/exercise for the past 2.5-3 months; 1 months  . Current meal patterns: o Breakfast: chocolate protein shake, occasional banana or other pieces of fruit  o Lunch: chicken wrap; water  o Supper: biggest meal of the day; occasional pizzas, occasional cooking at home; eats less in the summer  o Snacks: none during the day; will have some cashews/other nuts after supper o Drinks: water  . Current exercise: previous runner, but since COVID has not had the exercise tolerance; now has been walking at tracks (walking 2 miles mostly every day); occasionally doing  . Current blood glucose readings: n/a . Current weight management pharmacotherapy: none   Pharmacist Clinical Goal(s):  Marland Kitchen Over the next 90 days, patient will work with PharmD and primary care provider to work towards 5-10% body weight loss  Interventions: . Comprehensive medication review performed, medication list updated in electronic medical record . Inter-disciplinary care team collaboration (see longitudinal plan of care) . Patient has not met goal of at least 5% of body weight loss with comprehensive lifestyle modifications alone in the past 3-6 months. Pharmacotherapy is appropriate to pursue as augmentation.  . Considered Saxenda. However, patient does NOT have prescription drug coverage. There is a copay card for Saxenda that takes off up to $200/month, but patient's cost would still be >$1000 per month.  . Reviewed off-label prescribing of Victoza or  Ozempic. Cost would still be high. Reviewed household income - patient is over income for GLP1 assistance.  . Will collaborate w/ Dr. Nicki Reaper regarding next steps. Could consider metformin therapy, though unlikely to provide as significant of weight loss as GLP1. Could consider referral to Weight Management Clinic in [place]. Will collaborate w/ PCP and f/u with patient next week . Encouraged continued focus on low carbohydrate meals, smaller portion sizes, and at least 150 minutes of moderate intensity exercise weekly  Patient Self Care Activities:  . Patient will adhere to dietary modifications . Patient will target at least 150 minutes of moderate intensity exercise weekly . Patient will report any questions or concerns to provider   Initial goal documentation        The patient verbalized understanding of instructions provided today and declined a print copy of patient instruction materials.    Plan: - Will discuss w/ PCP and f/u with patient next week  Catie Darnelle Maffucci, PharmD, [place], [place] Pharmacist [place] [phone]

## 2019-08-12 NOTE — Chronic Care Management (AMB) (Signed)
Chronic Care Management   Note  08/12/2019 Name: Devin Humphrey MRN: [id number] DOB: 1970-06-15   Subjective:  Devin Humphrey is a 49 y.o. year old male who is a primary care patient of Einar Pheasant, MD. The CCM team was consulted for assistance with chronic disease management and care coordination needs.    Contacted patient for medication management review.  Review of patient status, including review of consultants reports, laboratory and other test data, was performed as part of comprehensive evaluation and provision of chronic care management services.   SDOH (Social Determinants of Health) assessments and interventions performed:  no  Objective:  Lab Results  Component Value Date   CREATININE 1.00 03/30/2019   CREATININE 0.95 01/27/2019   CREATININE 0.83 01/26/2019    No results found for: HGBA1C     Component Value Date/Time   CHOL 229 (H) 03/30/2019 1500   TRIG 251.0 (H) 03/30/2019 1500   HDL 29.80 (L) 03/30/2019 1500   CHOLHDL 8 03/30/2019 1500   VLDL 50.2 (H) 03/30/2019 1500   LDLCALC 100 (H) 01/25/2019 0455   LDLDIRECT 156.0 03/30/2019 1500    Clinical ASCVD: No  The 10-year ASCVD risk score Mikey Bussing DC Jr., et al., 2013) is: 3.7%   Values used to calculate the score:     Age: 79 years     Sex: Male     Is Non-Hispanic African American: No     Diabetic: No     Tobacco smoker: No     Systolic Blood Pressure: 568 mmHg     Is BP treated: No     HDL Cholesterol: 29.8 mg/dL     Total Cholesterol: 229 mg/dL    BP Readings from Last 3 Encounters:  05/12/19 100/62  01/27/19 115/80  01/24/19 118/72    No Known Allergies  Medications Reviewed Today    Reviewed by De Hollingshead, [facility] (Pharmacist) on 08/12/19 at [place] List Status: <None>  Medication Order Taking? Sig Documenting Provider Last Dose Status Informant  Ascorbic Acid (VITAMIN C) 100 MG tablet [id number] Yes Take 100 mg by mouth daily. [provider] Taking Active     cholecalciferol (VITAMIN D3) 25 MCG (1000 UT) tablet [id number] Yes Take 1,000 Units by mouth daily. [provider] Taking Active   zinc gluconate 50 MG tablet [id number] Yes Take 50 mg by mouth daily. [provider] Taking Active            Assessment:   Goals Addressed              This Visit's Progress     Patient Stated   .  PharmD "I want to work on weight loss" (pt-stated)        [place] (see longitudinal plan of care for additional care plan information)  Current Barriers:  . Obese; complicated by chronic medical conditions including s/p COVID, most recent BMI 35.4, previous BMI in 02/2019 31.75 . Patient does NOT have prescription insurance . Reports that he has been committed to diet/exercise for the past 2.5-3 months; 1 months  . Current meal patterns: o Breakfast: chocolate protein shake, occasional banana or other pieces of fruit  o Lunch: chicken wrap; water  o Supper: biggest meal of the day; occasional pizzas, occasional cooking at home; eats less in the summer  o Snacks: none during the day; will have some cashews/other nuts after supper o Drinks: water  . Current exercise: previous runner, but since COVID has not had  the exercise tolerance; now has been walking at tracks (walking 2 miles mostly every day); occasionally doing  . Current blood glucose readings: n/a . Current weight management pharmacotherapy: none   Pharmacist Clinical Goal(s):  Marland Kitchen Over the next 90 days, patient will work with PharmD and primary care provider to work towards 5-10% body weight loss  Interventions: . Comprehensive medication review performed, medication list updated in electronic medical record . Inter-disciplinary care team collaboration (see longitudinal plan of care) . Patient has not met goal of at least 5% of body weight loss with comprehensive lifestyle modifications alone in the past 3-6 months. Pharmacotherapy is appropriate to pursue as  augmentation.  . Considered Saxenda. However, patient does NOT have prescription drug coverage. There is a copay card for Saxenda that takes off up to $200/month, but patient's cost would still be >$1000 per month.  . Reviewed off-label prescribing of Victoza or Ozempic. Cost would still be high. Reviewed household income - patient is over income for GLP1 assistance.  . Will collaborate w/ Dr. Nicki Reaper regarding next steps. Could consider metformin therapy, though unlikely to provide as significant of weight loss as GLP1. Could consider referral to Weight Management Clinic in [place]. Will collaborate w/ PCP and f/u with patient next week . Encouraged continued focus on low carbohydrate meals, smaller portion sizes, and at least 150 minutes of moderate intensity exercise weekly  Patient Self Care Activities:  . Patient will adhere to dietary modifications . Patient will target at least 150 minutes of moderate intensity exercise weekly . Patient will report any questions or concerns to provider   Initial goal documentation        Plan: - Will discuss w/ PCP and f/u with patient next week  Catie Darnelle Maffucci, PharmD, [place], [place] Pharmacist [place] [phone]

## 2019-08-13 NOTE — Progress Notes (Signed)
I have reviewed the above note and agree. I was available to the pharmacist for consultation. Will d/w Catie regarding ok to start metformin and can refer Mr Radford to weight loss clinic if agreeable.    Dale [place], MD

## 2019-08-15 ENCOUNTER — Ambulatory Visit: Payer: PRIVATE HEALTH INSURANCE | Admitting: Pharmacist

## 2019-08-15 DIAGNOSIS — Z713 Dietary counseling and surveillance: Secondary | ICD-10-CM

## 2019-08-15 DIAGNOSIS — E669 Obesity, unspecified: Secondary | ICD-10-CM

## 2019-08-15 MED ORDER — METFORMIN HCL 500 MG PO TABS: 500.0000 mg | ORAL_TABLET | Freq: Two times a day (BID) | ORAL | 3 refills | Status: DC

## 2019-08-15 NOTE — Progress Notes (Signed)
I have reviewed the above note and agree. I was available to the pharmacist for consultation.  Simora Dingee, MD 

## 2019-08-15 NOTE — Patient Instructions (Signed)
Visit Information  Goals Addressed              This Visit's Progress     Patient Stated   .  PharmD "I want to work on weight loss" (pt-stated)        CARE PLAN ENTRY (see longitudinal plan of care for additional care plan information)  Current Barriers:  . Obese; complicated by chronic medical conditions including s/p COVID, most recent BMI 35.4, previous BMI in 02/2019 31.75 . Discussed GLP1 for weight loss. Unfortunately, no options for affordability as patient is uninsured and is over income for any GLP1 assistance programs.  . Current blood glucose readings: n/a . Current weight management pharmacotherapy: none   Pharmacist Clinical Goal(s):  . Over the next 90 days, patient will work with PharmD and primary care provider to work towards 5-10% body weight loss  Interventions: . Comprehensive medication review performed, medication list updated in electronic medical record . Inter-disciplinary care team collaboration (see longitudinal plan of care) . Patient has not met goal of at least 5% of body weight loss with comprehensive lifestyle modifications alone in the past 3-6 months. Pharmacotherapy is appropriate to pursue as augmentation.  . Discussed metformin for weight loss/DM prevention with/or Weight Management referral. Discussed benefits, side effects of metformin. Patient agreeable to start metformin, declines Weight Management referral at this time. Start metformin 500 mg BID, encouraged to start 1 tab w/ supper x 1 week then add second tab w/ breakfast. Can titrate to 1000 mg BID moving forward. F/u with PCP in ~6 weeks.   Patient Self Care Activities:  . Patient will adhere to dietary modifications . Patient will target at least 150 minutes of moderate intensity exercise weekly . Patient will report any questions or concerns to provider   Please see past updates related to this goal by clicking on the "Past Updates" button in the selected goal         The  patient verbalized understanding of instructions provided today and declined a print copy of patient instruction materials.    Plan:  - Scheduled f/u call in ~ 12 weeks (~4-6 weeks after PCP appt)  Catie Travis, PharmD, BCACP, CPP Clinical Pharmacist [facility] [phone]  

## 2019-08-15 NOTE — Chronic Care Management (AMB) (Signed)
Chronic Care Management   Follow Up Note   08/15/2019 Name: Devin Humphrey MRN: [id number] DOB: Dec 13, 1970  Referred by: Einar Pheasant, MD Reason for referral : Chronic Care Management (Medication Mangement)   Devin Humphrey is a 49 y.o. year old male who is a primary care patient of Einar Pheasant, MD. The CCM team was consulted for assistance with chronic disease management and care coordination needs.    Contacted patient for medication management f/u.   Review of patient status, including review of consultants reports, relevant laboratory and other test results, and collaboration with appropriate care team members and the patient's provider was performed as part of comprehensive patient evaluation and provision of chronic care management services.    SDOH (Social Determinants of Health) assessments performed: No See Care Plan activities for detailed interventions related to [facility])     Outpatient Encounter Medications as of 08/15/2019  Medication Sig  . Ascorbic Acid (VITAMIN C) 100 MG tablet Take 100 mg by mouth daily.  Marland Kitchen aspirin EC 81 MG tablet Take 81 mg by mouth daily. Swallow whole.  . cholecalciferol (VITAMIN D3) 25 MCG (1000 UT) tablet Take 1,000 Units by mouth daily.  . metFORMIN (GLUCOPHAGE) 500 MG tablet Take 1 tablet (500 mg total) by mouth 2 (two) times daily with a meal.  . zinc gluconate 50 MG tablet Take 50 mg by mouth daily.   No facility-administered encounter medications on file as of 08/15/2019.     Objective:   Goals Addressed              This Visit's Progress     Patient Stated   .  PharmD "I want to work on weight loss" (pt-stated)        [place] (see longitudinal plan of care for additional care plan information)  Current Barriers:  . Obese; complicated by chronic medical conditions including s/p COVID, most recent BMI 35.4, previous BMI in 02/2019 31.75 . Discussed GLP1 for weight loss. Unfortunately, no options for  affordability as patient is uninsured and is over income for any GLP1 assistance programs.  . Current blood glucose readings: n/a . Current weight management pharmacotherapy: none   Pharmacist Clinical Goal(s):  Marland Kitchen Over the next 90 days, patient will work with PharmD and primary care provider to work towards 5-10% body weight loss  Interventions: . Comprehensive medication review performed, medication list updated in electronic medical record . Inter-disciplinary care team collaboration (see longitudinal plan of care) . Patient has not met goal of at least 5% of body weight loss with comprehensive lifestyle modifications alone in the past 3-6 months. Pharmacotherapy is appropriate to pursue as augmentation.  . Discussed metformin for weight loss/DM prevention with/or Weight Management referral. Discussed benefits, side effects of metformin. Patient agreeable to start metformin, declines Weight Management referral at this time. Start metformin 500 mg BID, encouraged to start 1 tab w/ supper x 1 week then add second tab w/ breakfast. Can titrate to 1000 mg BID moving forward. F/u with PCP in ~6 weeks.   Patient Self Care Activities:  . Patient will adhere to dietary modifications . Patient will target at least 150 minutes of moderate intensity exercise weekly . Patient will report any questions or concerns to provider   Please see past updates related to this goal by clicking on the "Past Updates" button in the selected goal          Plan:  - Scheduled f/u call in ~ 12 weeks (~4-6  weeks after PCP appt)  Catie Darnelle Maffucci, PharmD, BCACP, CPP Clinical Pharmacist [place] [phone]

## 2019-09-13 ENCOUNTER — Telehealth: Payer: Self-pay | Admitting: Internal Medicine

## 2019-09-13 NOTE — Chronic Care Management (AMB) (Signed)
  Care Management   Note  09/13/2019 Name: Devin Humphrey MRN: [id number] DOB: 05-29-1970  Devin Humphrey is a 49 y.o. year old male who is a primary care patient of Dale [place], MD and is actively engaged with the care management team. I reached out to Devin Humphrey by phone today to assist with re-scheduling a follow up visit with the Pharmacist  Follow up plan: Unsuccessful telephone outreach attempt made. A HIPPA compliant phone message was left for the patient providing contact information and requesting a return call.  The care management team will reach out to the patient again over the next 7 days.  If patient returns call to provider office, please advise to call Embedded Care Management Care Guide Penne Lash  at [phone]  Penne Lash, RMA Care Guide, Embedded Care Coordination [facility]  [place], [place] [address] Direct Dial: [phone] [email or web address]: [email or web address]

## 2019-09-20 NOTE — Chronic Care Management (AMB) (Signed)
  Care Management   Note  09/20/2019 Name: CORT DRAGOO MRN: [id number] DOB: September 09, 1970  Cyndi Lennert is a 49 y.o. year old male who is a primary care patient of Dale [place], MD and is actively engaged with the care management team. I reached out to Cyndi Lennert by phone today to assist with re-scheduling a follow up visit with the Pharmacist  Follow up plan: Telephone appointment with care management team member scheduled for:11/11/2019  Penne Lash, RMA Care Guide, Embedded Care Coordination [facility]  [place], [place] [address] Direct Dial: [phone] [email or web address]: [email or web address]

## 2019-09-27 ENCOUNTER — Encounter: Payer: Self-pay | Admitting: Internal Medicine

## 2019-09-29 ENCOUNTER — Encounter: Payer: PRIVATE HEALTH INSURANCE | Admitting: Internal Medicine

## 2019-10-07 ENCOUNTER — Encounter: Payer: Self-pay | Admitting: Internal Medicine

## 2019-11-03 ENCOUNTER — Other Ambulatory Visit: Payer: Self-pay

## 2019-11-03 ENCOUNTER — Ambulatory Visit (INDEPENDENT_AMBULATORY_CARE_PROVIDER_SITE_OTHER): Payer: PRIVATE HEALTH INSURANCE | Admitting: Internal Medicine

## 2019-11-03 ENCOUNTER — Encounter: Payer: Self-pay | Admitting: Internal Medicine

## 2019-11-03 VITALS — BP 122/80 | HR 87 | Temp 97.9°F | Resp 16 | Ht 66.0 in | Wt 206.4 lb

## 2019-11-03 DIAGNOSIS — Z0000 Encounter for general adult medical examination without abnormal findings: Secondary | ICD-10-CM | POA: Diagnosis not present

## 2019-11-03 DIAGNOSIS — Z6833 Body mass index (BMI) 33.0-33.9, adult: Secondary | ICD-10-CM

## 2019-11-03 DIAGNOSIS — E7800 Pure hypercholesterolemia, unspecified: Secondary | ICD-10-CM | POA: Diagnosis not present

## 2019-11-03 DIAGNOSIS — Z1211 Encounter for screening for malignant neoplasm of colon: Secondary | ICD-10-CM | POA: Diagnosis not present

## 2019-11-03 LAB — COMPREHENSIVE METABOLIC PANEL
ALT: 23 U/L (ref 0–53)
AST: 19 U/L (ref 0–37)
Albumin: 4.6 g/dL (ref 3.5–5.2)
Alkaline Phosphatase: 70 U/L (ref 39–117)
BUN: 22 mg/dL (ref 6–23)
CO2: 28 mEq/L (ref 19–32)
Calcium: 9.1 mg/dL (ref 8.4–10.5)
Chloride: 102 mEq/L (ref 96–112)
Creatinine, Ser: 1.05 mg/dL (ref 0.40–1.50)
GFR: 75.43 mL/min (ref >60.00)
Glucose, Bld: 92 mg/dL (ref 70–99)
Potassium: 4.1 mEq/L (ref 3.5–5.1)
Sodium: 138 mEq/L (ref 135–145)
Total Bilirubin: 0.4 mg/dL (ref 0.2–1.2)
Total Protein: 6.8 g/dL (ref 6.0–8.3)

## 2019-11-03 LAB — LIPID PANEL
Cholesterol: 151 mg/dL (ref 0–200)
HDL: 27.1 mg/dL — ABNORMAL LOW (ref >39.00)
LDL Cholesterol: 97 mg/dL (ref 0–99)
NonHDL: 123.98
Total CHOL/HDL Ratio: 6
Triglycerides: 136 mg/dL (ref 0.0–149.0)
VLDL: 27.2 mg/dL (ref 0.0–40.0)

## 2019-11-03 NOTE — Assessment & Plan Note (Signed)
Physical today 11/03/19.  PSA 03/2019 .74.   Discussed colon cancer screening and guideline change to age 48.  Agreeable for referral.

## 2019-11-03 NOTE — Progress Notes (Signed)
Patient ID: Devin Humphrey, male   DOB: [birth year], 49 y.o.   MRN: [id number]   Subjective:    Patient ID: Devin Humphrey, male    DOB: [birth year], 49 y.o.   MRN: [id number]  HPI This visit occurred during the SARS-CoV-2 public health emergency.  Safety protocols were in place, including screening questions prior to the visit, additional usage of staff PPE, and extensive cleaning of exam room while observing appropriate contact time as indicated for disinfecting solutions.  Patient here for a scheduled follow up/physical.  He is doing well.  Feels good.  Has started exercising.  Is in a regular routine now.  Has adjusted his diet and lost weight.  Feels better now then he has in a long time.  No chest pain or sob.  No acid reflux.  No abdominal pain or bowel change reported.  Was on metformin.  Had diarrhea.  Off for approximately 6 weeks.  No diarrhea now.  Discussed due colonoscopy.    Past Medical History:  Diagnosis Date  . Acute respiratory failure with hypoxia (HCC)   . CONTACT DERMATITIS&OTHER ECZEMA DUE TO PLANTS 08/19/2009   Qualifier: Diagnosis of  By: Thurmond Butts MD, Dennard Nip    . Degenerative disc disease    cervical disc disease  . GERD (gastroesophageal reflux disease)    Past Surgical History:  Procedure Laterality Date  . COLONOSCOPY    . UPPER GI ENDOSCOPY     Family History  Problem Relation Age of Onset  . Hyperlipidemia Father   . Heart disease Father        s/p CABG and stent  . Stroke Father   . Hypertension Father   . Colon cancer Neg Hx   . Prostate cancer Neg Hx    Social History   Socioeconomic History  . Marital status: Married    Spouse name: Graeson Nouri  . Number of children: Not on file  . Years of education: Not on file  . Highest education level: Not on file  Occupational History  . Not on file  Tobacco Use  . Smoking status: Never Smoker  . Smokeless tobacco: Never Used  Vaping Use  . Vaping Use: Never assessed  Substance and Sexual  Activity  . Alcohol use: Yes    Alcohol/week: 0.0 standard drinks    Comment: occasional  . Drug use: No  . Sexual activity: Yes    Partners: Female  Other Topics Concern  . Not on file  Social History Narrative  . Not on file   Social Determinants of Health   Financial Resource Strain: Low Risk   . Difficulty of Paying Living Expenses: Not hard at all  Food Insecurity: Unknown  . Worried About Programme researcher, broadcasting/film/video in the Last Year: Patient refused  . Ran Out of Food in the Last Year: Patient refused  Transportation Needs: No Transportation Needs  . Lack of Transportation (Medical): No  . Lack of Transportation (Non-Medical): No  Physical Activity: Insufficiently Active  . Days of Exercise per Week: 3 days  . Minutes of Exercise per Session: 30 min  Stress: Stress Concern Present  . Feeling of Stress : Very much  Social Connections:   . Frequency of Communication with Friends and Family: Not on file  . Frequency of Social Gatherings with Friends and Family: Not on file  . Attends Religious Services: Not on file  . Active Member of Clubs or Organizations: Not on file  . Attends Banker Meetings:  Not on file  . Marital Status: Not on file    Outpatient Encounter Medications as of 11/03/2019  Medication Sig  . Ascorbic Acid (VITAMIN C) 100 MG tablet Take 100 mg by mouth daily.  Marland Kitchen aspirin EC 81 MG tablet Take 81 mg by mouth daily. Swallow whole.  . cholecalciferol (VITAMIN D3) 25 MCG (1000 UT) tablet Take 1,000 Units by mouth daily.  Marland Kitchen zinc gluconate 50 MG tablet Take 50 mg by mouth daily.  . [DISCONTINUED] metFORMIN (GLUCOPHAGE) 500 MG tablet Take 1 tablet (500 mg total) by mouth 2 (two) times daily with a meal.   No facility-administered encounter medications on file as of 11/03/2019.    Review of Systems  Constitutional: Negative for appetite change and unexpected weight change.  HENT: Negative for congestion and sinus pressure.   Respiratory: Negative for  cough, chest tightness and shortness of breath.   Cardiovascular: Negative for chest pain, palpitations and leg swelling.  Gastrointestinal: Negative for abdominal pain, diarrhea, nausea and vomiting.  Genitourinary: Negative for difficulty urinating and dysuria.  Musculoskeletal: Negative for joint swelling and myalgias.  Skin: Negative for color change and rash.  Neurological: Negative for dizziness, light-headedness and headaches.  Psychiatric/Behavioral: Negative for agitation and dysphoric mood.       Objective:    Physical Exam Vitals reviewed.  Constitutional:      General: He is not in acute distress.    Appearance: Normal appearance. He is well-developed.  HENT:     Head: Normocephalic and atraumatic.     Right Ear: External ear normal.     Left Ear: External ear normal.  Eyes:     General: No scleral icterus.       Right eye: No discharge.        Left eye: No discharge.     Conjunctiva/sclera: Conjunctivae normal.  Cardiovascular:     Rate and Rhythm: Normal rate and regular rhythm.  Pulmonary:     Effort: Pulmonary effort is normal. No respiratory distress.     Breath sounds: Normal breath sounds.  Abdominal:     General: Bowel sounds are normal.     Palpations: Abdomen is soft.     Tenderness: There is no abdominal tenderness.  Musculoskeletal:        General: No swelling or tenderness.     Cervical back: Neck supple. No tenderness.  Lymphadenopathy:     Cervical: No cervical adenopathy.  Skin:    Findings: No erythema or rash.  Neurological:     Mental Status: He is alert.  Psychiatric:        Mood and Affect: Mood normal.        Behavior: Behavior normal.     BP 122/80   Pulse 87   Temp 97.9 F (36.6 C) (Oral)   Resp 16   Ht 5\' 6"  (1.676 m)   Wt 206 lb 6.4 oz (93.6 kg)   SpO2 97%   BMI 33.31 kg/m  Wt Readings from Last 3 Encounters:  11/03/19 206 lb 6.4 oz (93.6 kg)  05/12/19 216 lb (98 kg)  03/25/19 215 lb (97.5 kg)     Lab Results    Component Value Date   WBC 9.6 03/30/2019   HGB 15.3 03/30/2019   HCT 45.6 03/30/2019   PLT 302.0 03/30/2019   GLUCOSE 92 11/03/2019   CHOL 151 11/03/2019   TRIG 136.0 11/03/2019   HDL 27.10 (L) 11/03/2019   LDLDIRECT 156.0 03/30/2019   LDLCALC 97 11/03/2019  ALT 23 11/03/2019   AST 19 11/03/2019   NA 138 11/03/2019   K 4.1 11/03/2019   CL 102 11/03/2019   CREATININE 1.05 11/03/2019   BUN 22 11/03/2019   CO2 28 11/03/2019   TSH 2.29 03/30/2019   PSA 0.74 03/30/2019       Assessment & Plan:   Problem List Items Addressed This Visit    Hypercholesterolemia    Low cholesterol diet and exercise.  Follow lipid panel.       Relevant Orders   Lipid panel (Completed)   Comprehensive metabolic panel (Completed)   Health care maintenance    Physical today 11/03/19.  PSA 03/2019 .74.   Discussed colon cancer screening and guideline change to age 13.  Agreeable for referral.        BMI 33.0-33.9,adult    Has adjusted his diet.  Lost weight.  Feels good.  Follow.         Other Visit Diagnoses    Routine general medical examination at a health care facility    -  Primary   Colon cancer screening       Relevant Orders   Ambulatory referral to Gastroenterology       Dale [place], MD

## 2019-11-07 ENCOUNTER — Telehealth: Payer: PRIVATE HEALTH INSURANCE

## 2019-11-10 ENCOUNTER — Ambulatory Visit: Payer: PRIVATE HEALTH INSURANCE | Admitting: Pharmacist

## 2019-11-10 DIAGNOSIS — Z713 Dietary counseling and surveillance: Secondary | ICD-10-CM

## 2019-11-10 DIAGNOSIS — E7800 Pure hypercholesterolemia, unspecified: Secondary | ICD-10-CM

## 2019-11-10 DIAGNOSIS — E669 Obesity, unspecified: Secondary | ICD-10-CM

## 2019-11-10 NOTE — Chronic Care Management (AMB) (Signed)
  Chronic Care Management   Follow Up Note   11/10/2019 Name: Devin Humphrey MRN: [id number] DOB: [birth year]  Referred by: Dale Napier [place], MD Reason for referral : Chronic Care Management   Devin Humphrey is a 49 y.o. year old male who is a primary care patient of Dale [place], MD. The CCM team was consulted for assistance with chronic disease management and care coordination needs.    Contacted patient for medication management f/u.  Review of patient status, including review of consultants reports, relevant laboratory and other test results, and collaboration with appropriate care team members and the patient's provider was performed as part of comprehensive patient evaluation and provision of chronic care management services.    SDOH (Social Determinants of Health) assessments performed: Yes See Care Plan activities for detailed interventions related to SDOH)  SDOH Interventions     Most Recent Value  SDOH Interventions  Financial Strain Interventions Intervention Not Indicated  Physical Activity Interventions Intervention Not Indicated       Outpatient Encounter Medications as of 11/10/2019  Medication Sig  . Ascorbic Acid (VITAMIN C) 100 MG tablet Take 100 mg by mouth daily.  Marland Kitchen aspirin EC 81 MG tablet Take 81 mg by mouth daily. Swallow whole.  . cholecalciferol (VITAMIN D3) 25 MCG (1000 UT) tablet Take 1,000 Units by mouth daily.  Marland Kitchen zinc gluconate 50 MG tablet Take 50 mg by mouth daily.   No facility-administered encounter medications on file as of 11/10/2019.     Objective:   Goals Addressed              This Visit's Progress     Patient Stated   .  COMPLETED: PharmD "I want to work on weight loss" (pt-stated)        CARE PLAN ENTRY (see longitudinal plan of care for additional care plan information)  Current Barriers:  . Obese; complicated by chronic medical conditions including s/p COVID, most recent BMI 33, down from 35 when we last spoke.   Marland Kitchen Discussed GLP1 for weight loss. Unfortunately, no options for affordability as patient is uninsured and is over income for any GLP1 assistance programs. Started metformin therapy, however, patient reports diarrhea and he d/c medication. Notes that he has been working on eating more low-carb options and exercising.  . Current blood glucose readings: n/a . Current weight management pharmacotherapy: none   Pharmacist Clinical Goal(s):  Marland Kitchen Over the next 90 days, patient will work with PharmD and primary care provider to work towards 5-10% body weight loss  Interventions: . Comprehensive medication review performed, medication list updated in electronic medical record . Inter-disciplinary care team collaboration (see longitudinal plan of care) . Patient is losing weight w/o pharmacotherapy at this time. Declines needs, questions, or concerns.   Patient Self Care Activities:  . Patient will adhere to dietary modifications . Patient will target at least 150 minutes of moderate intensity exercise weekly . Patient will report any questions or concerns to provider   Please see past updates related to this goal by clicking on the "Past Updates" button in the selected goal          Plan:  - No further medication needs identified at this time. Closing CCM case. Patient has my contact information for future questions or concerns.   [name], PharmD, [place], CPP Clinical Pharmacist [facility] [employer] [phone]

## 2019-11-10 NOTE — Patient Instructions (Signed)
Visit Information  Goals Addressed              This Visit's Progress     Patient Stated   .  COMPLETED: PharmD "I want to work on weight loss" (pt-stated)        CARE PLAN ENTRY (see longitudinal plan of care for additional care plan information)  Current Barriers:  . Obese; complicated by chronic medical conditions including s/p COVID, most recent BMI 33, down from 35 when we last spoke.  Marland Kitchen Discussed GLP1 for weight loss. Unfortunately, no options for affordability as patient is uninsured and is over income for any GLP1 assistance programs. Started metformin therapy, however, patient reports diarrhea and he d/c medication. Notes that he has been working on eating more low-carb options and exercising.  . Current blood glucose readings: n/a . Current weight management pharmacotherapy: none   Pharmacist Clinical Goal(s):  Marland Kitchen Over the next 90 days, patient will work with PharmD and primary care provider to work towards 5-10% body weight loss  Interventions: . Comprehensive medication review performed, medication list updated in electronic medical record . Inter-disciplinary care team collaboration (see longitudinal plan of care) . Patient is losing weight w/o pharmacotherapy at this time. Declines needs, questions, or concerns.   Patient Self Care Activities:  . Patient will adhere to dietary modifications . Patient will target at least 150 minutes of moderate intensity exercise weekly . Patient will report any questions or concerns to provider   Please see past updates related to this goal by clicking on the "Past Updates" button in the selected goal         The patient verbalized understanding of instructions provided today and declined a print copy of patient instruction materials.    Plan:  - No further medication needs identified at this time. Closing CCM case. Patient has my contact information for future questions or concerns.   [name], PharmD, [place],  CPP Clinical Pharmacist [facility] [place] [employer] [phone]

## 2019-11-11 ENCOUNTER — Telehealth: Payer: PRIVATE HEALTH INSURANCE

## 2019-11-13 ENCOUNTER — Encounter: Payer: Self-pay | Admitting: Internal Medicine

## 2019-11-13 DIAGNOSIS — Z6833 Body mass index (BMI) 33.0-33.9, adult: Secondary | ICD-10-CM | POA: Insufficient documentation

## 2019-11-13 NOTE — Assessment & Plan Note (Signed)
Low cholesterol diet and exercise.  Follow lipid panel.   

## 2019-11-13 NOTE — Assessment & Plan Note (Signed)
Has adjusted his diet.  Lost weight.  Feels good.  Follow.

## 2019-11-28 ENCOUNTER — Telehealth: Payer: Self-pay

## 2019-11-28 NOTE — Telephone Encounter (Signed)
Patient called to cancel his appt for 11/29/19. He did not state whether he would call back to reschedule.

## 2019-11-29 ENCOUNTER — Telehealth: Payer: PRIVATE HEALTH INSURANCE

## 2020-03-14 ENCOUNTER — Encounter: Payer: Self-pay | Admitting: Internal Medicine

## 2020-03-16 ENCOUNTER — Other Ambulatory Visit: Payer: Self-pay

## 2020-03-16 ENCOUNTER — Telehealth: Payer: Self-pay | Admitting: Internal Medicine

## 2020-03-16 ENCOUNTER — Encounter: Payer: Self-pay | Admitting: Internal Medicine

## 2020-03-16 ENCOUNTER — Telehealth (INDEPENDENT_AMBULATORY_CARE_PROVIDER_SITE_OTHER): Payer: PRIVATE HEALTH INSURANCE | Admitting: Internal Medicine

## 2020-03-16 DIAGNOSIS — Z20822 Contact with and (suspected) exposure to covid-19: Secondary | ICD-10-CM

## 2020-03-16 DIAGNOSIS — M25512 Pain in left shoulder: Secondary | ICD-10-CM

## 2020-03-16 MED ORDER — MELOXICAM 15 MG PO TABS: 15.0000 mg | ORAL_TABLET | Freq: Every day | ORAL | 0 refills | Status: DC

## 2020-03-16 NOTE — Progress Notes (Signed)
Patient ID: Devin Humphrey, male   DOB: [birth year], 50 y.o.   MRN: [id number]   Virtual Visit via video Note  This visit type was conducted due to national recommendations for restrictions regarding the COVID-19 pandemic (e.g. social distancing).  This format is felt to be most appropriate for this patient at this time.  All issues noted in this document were discussed and addressed.  No physical exam was performed (except for noted visual exam findings with Video Visits).   I connected with Tyrell Antonio by a video enabled telemedicine application and verified that I am speaking with the correct person using two identifiers. Location patient: home Location provider:  home office Persons participating in the virtual visit: patient, provider  The limitations, risks, security and privacy concerns of performing an evaluation and management service by video and the availability of in person appointments have been discussed.  It has also been discussed with the patient that there may be a patient responsible charge related to this service. The patient expressed understanding and agreed to proceed.   Reason for visit: work in appt  HPI: Work in for left shoulder pain.  Shoveled snow earlier this week.  No known injury or trauma.  That evening, left shoulder starting hurting.  Pain increased.  Hard to put on jacket.  Hurt - lying down.  Hard to raise arm  Limited rom.  Using icy hot and taking tylenol.  Also started ibuprofen.  Wife has covid.  He is without acute symptoms.  No cough, congestion or sob.     ROS: See pertinent positives and negatives per HPI.  Past Medical History:  Diagnosis Date  . Acute respiratory failure with hypoxia (HCC)   . CONTACT DERMATITIS&OTHER ECZEMA DUE TO PLANTS 08/19/2009   Qualifier: Diagnosis of  By: Thurmond Butts MD, Dennard Nip    . Degenerative disc disease    cervical disc disease  . GERD (gastroesophageal reflux disease)     Past Surgical History:  Procedure  Laterality Date  . COLONOSCOPY    . UPPER GI ENDOSCOPY      Family History  Problem Relation Age of Onset  . Hyperlipidemia Father   . Heart disease Father        s/p CABG and stent  . Stroke Father   . Hypertension Father   . Colon cancer Neg Hx   . Prostate cancer Neg Hx     SOCIAL HX: reviewed.    Current Outpatient Medications:  .  meloxicam (MOBIC) 15 MG tablet, Take 1 tablet (15 mg total) by mouth daily., Disp: 20 tablet, Rfl: 0 .  Ascorbic Acid (VITAMIN C) 100 MG tablet, Take 100 mg by mouth daily., Disp: , Rfl:  .  aspirin EC 81 MG tablet, Take 81 mg by mouth daily. Swallow whole., Disp: , Rfl:  .  cholecalciferol (VITAMIN D3) 25 MCG (1000 UT) tablet, Take 1,000 Units by mouth daily., Disp: , Rfl:  .  zinc gluconate 50 MG tablet, Take 50 mg by mouth daily., Disp: , Rfl:   EXAM:  GENERAL: alert, oriented, appears well and in no acute distress  HEENT: atraumatic, conjunttiva clear, no obvious abnormalities on inspection of external nose and ears  NECK: normal movements of the head and neck  LUNGS: on inspection no signs of respiratory distress, breathing rate appears normal, no obvious gross SOB, gasping or wheezing  CV: no obvious cyanosis  MS: increased pain with abduction at or just above 90 degrees. Better rom with forward extension.  PSYCH/NEURO: pleasant and cooperative, no obvious depression or anxiety, speech and thought processing grossly intact  ASSESSMENT AND PLAN:  Discussed the following assessment and plan:  Problem List Items Addressed This Visit    Close exposure to COVID-19 virus    Wife with covid.  He is having no symptoms.  Follow. Notify me if any symptoms develop.        Left shoulder pain    Acute left shoulder pain and limited rom after shoveling snow.  Is some better.  Still with pain especially at or above 90 degrees.  Treat with meloxicam.  Discussed possible side effects.  Keep me posted and notify me if persistent pain.            I discussed the assessment and treatment plan with the patient. The patient was provided an opportunity to ask questions and all were answered. The patient agreed with the plan and demonstrated an understanding of the instructions.   The patient was advised to call back or seek an in-person evaluation if the symptoms worsen or if the condition fails to improve as anticipated.    Dale [place], MD

## 2020-03-16 NOTE — Telephone Encounter (Signed)
My chart message sent to pt for update.   

## 2020-03-17 ENCOUNTER — Encounter: Payer: Self-pay | Admitting: Internal Medicine

## 2020-03-17 DIAGNOSIS — Z20822 Contact with and (suspected) exposure to covid-19: Secondary | ICD-10-CM | POA: Insufficient documentation

## 2020-03-17 DIAGNOSIS — M25512 Pain in left shoulder: Secondary | ICD-10-CM | POA: Insufficient documentation

## 2020-03-17 NOTE — Assessment & Plan Note (Signed)
Acute left shoulder pain and limited rom after shoveling snow.  Is some better.  Still with pain especially at or above 90 degrees.  Treat with meloxicam.  Discussed possible side effects.  Keep me posted and notify me if persistent pain.

## 2020-03-17 NOTE — Assessment & Plan Note (Signed)
Wife with covid.  He is having no symptoms.  Follow. Notify me if any symptoms develop.

## 2020-07-11 ENCOUNTER — Encounter: Payer: Self-pay | Admitting: Internal Medicine

## 2020-07-11 DIAGNOSIS — Z6833 Body mass index (BMI) 33.0-33.9, adult: Secondary | ICD-10-CM

## 2020-07-13 NOTE — Telephone Encounter (Signed)
Patient agreeable to referral with Catie

## 2020-07-13 NOTE — Telephone Encounter (Signed)
I would prefer to avoid phentermine.  We can refer to Catie to see if wegovy, saxenday - would be an option.  I can place order for referral or if he wants an appt to discuss with me - can schedule.

## 2020-07-13 NOTE — Telephone Encounter (Signed)
Order placed for CCM referral.  

## 2020-07-16 ENCOUNTER — Telehealth: Payer: Self-pay

## 2020-07-16 NOTE — Chronic Care Management (AMB) (Signed)
  Chronic Care Management   Note  07/16/2020 Name: DONIE LEMELIN MRN: [id number] DOB: 04-Jul-1970  Cyndi Lennert is a 50 y.o. year old male who is a primary care patient of Dale Orr, MD. [facility] BODEY is currently enrolled in care management services. An additional referral for Pharm D  was placed.   Follow up plan: Telephone appointment with care management team member scheduled for:07/30/2020  Penne Lash, RMA Care Guide, Embedded Care Coordination [facility]  [place], [place] [address] Direct Dial: [phone] [email or web address]: [email or web address]

## 2020-07-30 ENCOUNTER — Ambulatory Visit: Payer: PRIVATE HEALTH INSURANCE | Admitting: Pharmacist

## 2020-07-30 DIAGNOSIS — Z6833 Body mass index (BMI) 33.0-33.9, adult: Secondary | ICD-10-CM

## 2020-07-30 DIAGNOSIS — E7800 Pure hypercholesterolemia, unspecified: Secondary | ICD-10-CM

## 2020-07-30 DIAGNOSIS — E669 Obesity, unspecified: Secondary | ICD-10-CM

## 2020-07-30 MED ORDER — METFORMIN HCL ER 500 MG PO TB24: 1000.0000 mg | ORAL_TABLET | Freq: Two times a day (BID) | ORAL | 1 refills | Status: DC

## 2020-07-30 NOTE — Chronic Care Management (AMB) (Addendum)
Care Management   Pharmacy Note  07/30/2020 Name: Devin Humphrey MRN: [id number] DOB: [birth year]  Subjective: Devin Humphrey is Humphrey 50 y.o. year old male who is Humphrey primary care patient of Devin Day, MD. The Care Management team was consulted for assistance with care management and care coordination needs.    Engaged with patient by telephone for initial visit in response to provider referral for pharmacy case management and/or care coordination services.   The patient was given information about Care Management services today including:  1. Care Management services includes personalized support from designated clinical staff supervised by the patient's primary care provider, including individualized plan of care and coordination with other care providers. 2. 24/7 contact phone numbers for assistance for urgent and routine care needs. 3. The patient may stop case management services at any time by phone call to the office staff.  Patient agreed to services and consent obtained.  Assessment:  Review of patient status, including review of consultants reports, laboratory and other test data, was performed as part of comprehensive evaluation and provision of chronic care management services.   SDOH (Social Determinants of Health) assessments and interventions performed:  SDOH Interventions   Flowsheet Row Most Recent Value  SDOH Interventions   Financial Strain Interventions Intervention Not Indicated       Objective:  Lab Results  Component Value Date   CREATININE 1.05 11/03/2019   CREATININE 1.00 03/30/2019   CREATININE 0.95 01/27/2019       Component Value Date/Time   CHOL 151 11/03/2019 0837   TRIG 136.0 11/03/2019 0837   HDL 27.10 (L) 11/03/2019 0837   CHOLHDL 6 11/03/2019 0837   VLDL 27.2 11/03/2019 0837   LDLCALC 97 11/03/2019 0837   LDLDIRECT 156.0 03/30/2019 1500   Clinical ASCVD: No  The 10-year ASCVD risk score Denman George DC Jr., et al., 2013) is: 3.4%    Values used to calculate the score:     Age: 61 years     Sex: Male     Is Non-Hispanic African American: No     Diabetic: No     Tobacco smoker: No     Systolic Blood Pressure: 122 mmHg     Is BP treated: No     HDL Cholesterol: 27.1 mg/dL     Total Cholesterol: 151 mg/dL    BP Readings from Last 3 Encounters:  11/03/19 122/80  05/12/19 100/62  01/27/19 115/80    Care Plan  No Known Allergies  Medications Reviewed Today    Reviewed by Devin Humphrey, RPH (Pharmacist) on 07/30/20 at 1348  Med List Status: <None>  Medication Order Taking? Sig Documenting Provider Last Dose Status Informant  Ascorbic Acid (VITAMIN C) 100 MG tablet [id number] Yes Take 100 mg by mouth daily. [provider] Taking Active   aspirin EC 81 MG tablet [id number] Yes Take 81 mg by mouth daily. Swallow whole. [provider] Taking Active   cholecalciferol (VITAMIN D3) 25 MCG (1000 UT) tablet [id number] Yes Take 1,000 Units by mouth daily. [provider] Taking Active   meloxicam (MOBIC) 15 MG tablet [id number] No Take 1 tablet (15 mg total) by mouth daily.  Patient not taking: Reported on 07/30/2020   Devin [place], MD Not Taking Active   zinc gluconate 50 MG tablet [id number] Yes Take 50 mg by mouth daily. [provider] Taking Active           Patient Active Problem List   Diagnosis Date Noted  .  Left shoulder pain 03/17/2020  . Close exposure to COVID-19 virus 03/17/2020  . BMI 33.0-33.9,adult 11/13/2019  . Weight loss counseling, encounter for 07/17/2019  . Fatigue 03/27/2019  . Nocturnal oxygen desaturation 02/12/2019  . Pneumonia due to COVID-19 virus 01/24/2019  . Acute respiratory disease due to COVID-19 virus 01/23/2019  . Syncope 01/23/2019  . Health care maintenance 12/11/2015  . Cervical spondylosis with radiculopathy 01/22/2015  . Sinusitis 10/19/2014  . AP (abdominal pain) 02/27/2014  . Hypercholesterolemia 12/19/2013  . Anxiety 10/09/2013  . SOB  (shortness of breath) 08/27/2013  . Light headedness 09/25/2012  . GERD (gastroesophageal reflux disease) 09/25/2012  . Cervical disc disease 09/25/2012    Conditions to be addressed/monitored: Obesity, GERD  Care Plan : Medication Management  Updates made by Devin Humphrey, RPH since 07/30/2020 12:00 AM    Problem: Obesity, GERD     Long-Range Goal: Disease Progression Prevention   Start Date: 07/30/2020  This Visit's Progress: On track  Priority: High  Note:   Current Barriers:  . Unable to achieve control of weight   Pharmacist Clinical Goal(s):  Marland Kitchen Over the next 90 days, patient will achieve control of weight as evidenced by continued weight loss  through collaboration with PharmD and provider.   Interventions: . 1:1 collaboration with Devin [place], MD regarding development and update of comprehensive plan of care as evidenced by provider attestation and co-signature . Inter-disciplinary care team collaboration (see longitudinal plan of care) . Comprehensive medication review performed; medication list updated in electronic medical record  Obesity (BMI ~30): Marland Kitchen Uncontrolled; current treatment: none o Has tried metformin IR in the past, but discontinued due to diarrhea . Previously tried weight loss programs: Optavia (lost ~20 lbs) . Current weight: 218 lbs . Current meal patterns: breakfast: usually skips, coffee; lunch: fast food; dinner: home cooked meal or fast food; snacks: trying to minimize late night snacking; drinks: drinking more water . Current exercise: weights and jump rope 30 minutes every other night  . Currently, no cost effective brand name long term weight loss medication for uninsured patients. Current income does not qualify for patient assistance. Discussed possible referral to weight loss management clinic in [place], however pt declined. Would avoid phentermine given CV risk and ability to only use for 12 weeks.  Pt willling to retry metformin. . Discussed  importance of incorporating fiber into diet and reducing portion sizes. . Encouraged patient to continue to exercise 30 minutes 5x/week with the goal of 150 minutes per week. . Recommended metformin XR 1000 mg BID. Provided titration recommendation.   Supplementation: . Current treatment: vitamin C, aspirin, vitamin D, zinc   Patient Goals/Self-Care Activities . Over the next 90 days, patient will:  - collaborate with provider on medication access solutions target Humphrey minimum of 150 minutes of moderate intensity exercise weekly engage in dietary modifications by reducing portion sizes, minimizing late night snacking, and avoid fast food restaurants.  Follow Up Plan: Telephone follow up appointment with care management team member scheduled for: ~5 weeks      Medication Assistance:  None required.  Patient affirms current coverage meets needs.  Follow Up:  Patient agrees to Care Plan and Follow-up.  Plan: Telephone follow up appointment with care management team member scheduled for:  ~5 weeks  Fabio Neighbors, PharmD, BCPS PGY2 Ambulatory Care Resident [facility]  Pharmacy   I was present for this visit and agree with the documentation by the resident as above.   [name], PharmD, [place], CPP  Contractor at [place]

## 2020-07-30 NOTE — Patient Instructions (Signed)
  Visit Information  PATIENT GOALS:  Goals Addressed              This Visit's Progress   .  Medication Management (pt-stated)        Patient Goals/Self-Care Activities . Over the next 90 days, patient will:  - collaborate with provider on medication access solutions target a minimum of 150 minutes of moderate intensity exercise weekly engage in dietary modifications by reducing portion sizes, minimizing late night snacking, and avoid fast food restaurants.       Devin Humphrey was given information about Care Management services by the embedded care coordination team including:  1. Care Management services include personalized support from designated clinical staff supervised by his physician, including individualized plan of care and coordination with other care providers 2. 24/7 contact phone numbers for assistance for urgent and routine care needs. 3. The patient may stop CCM services at any time (effective at the end of the month) by phone call to the office staff.  Patient agreed to services and verbal consent obtained.   Patient verbalizes understanding of instructions provided today and agrees to view in MyChart.   Telephone follow up appointment with care management team member scheduled for: ~5 weeks  Fabio Neighbors, PharmD, BCPS PGY2 Ambulatory Care Resident [facility]  Pharmacy

## 2020-09-05 ENCOUNTER — Ambulatory Visit: Payer: PRIVATE HEALTH INSURANCE | Admitting: Pharmacist

## 2020-09-05 DIAGNOSIS — Z6833 Body mass index (BMI) 33.0-33.9, adult: Secondary | ICD-10-CM

## 2020-09-05 DIAGNOSIS — E669 Obesity, unspecified: Secondary | ICD-10-CM

## 2020-09-05 MED ORDER — METFORMIN HCL ER 500 MG PO TB24: 1000.0000 mg | ORAL_TABLET | Freq: Two times a day (BID) | ORAL | 3 refills | Status: DC

## 2020-09-05 NOTE — Chronic Care Management (AMB) (Signed)
Care Management   Pharmacy Note  09/05/2020 Name: Devin Humphrey MRN: [id number] DOB: [birth year]  Subjective: Devin Humphrey is a 50 y.o. year old male who is a primary care patient of Einar Pheasant, MD. The Care Management team was consulted for assistance with care management and care coordination needs.    Engaged with patient by telephone for follow up visit in response to provider referral for pharmacy case management and/or care coordination services.   The patient was given information about Care Management services today including:  Care Management services includes personalized support from designated clinical staff supervised by the patient's primary care provider, including individualized plan of care and coordination with other care providers. 24/7 contact phone numbers for assistance for urgent and routine care needs. The patient may stop case management services at any time by phone call to the office staff.  Patient agreed to services and consent obtained.  Assessment:  Review of patient status, including review of consultants reports, laboratory and other test data, was performed as part of comprehensive evaluation and provision of chronic care management services.   SDOH (Social Determinants of Health) assessments and interventions performed:    Objective:  Lab Results  Component Value Date   CREATININE 1.05 11/03/2019   CREATININE 1.00 03/30/2019   CREATININE 0.95 01/27/2019    No results found for: HGBA1C     Component Value Date/Time   CHOL 151 11/03/2019 0837   TRIG 136.0 11/03/2019 0837   HDL 27.10 (L) 11/03/2019 0837   CHOLHDL 6 11/03/2019 0837   VLDL 27.2 11/03/2019 0837   LDLCALC 97 11/03/2019 0837   LDLDIRECT 156.0 03/30/2019 1500     Clinical ASCVD: No  The 10-year ASCVD risk score Mikey Bussing DC Jr., et al., 2013) is: 3.4%   Values used to calculate the score:     Age: 36 years     Sex: Male     Is Non-Hispanic African American: No      Diabetic: No     Tobacco smoker: No     Systolic Blood Pressure: 025 mmHg     Is BP treated: No     HDL Cholesterol: 27.1 mg/dL     Total Cholesterol: 151 mg/dL   BP Readings from Last 3 Encounters:  11/03/19 122/80  05/12/19 100/62  01/27/19 115/80    Care Plan  No Known Allergies  Medications Reviewed Today     Reviewed by De Hollingshead, RPH-CPP (Pharmacist) on 09/05/20 at 1356  Med List Status: <None>   Medication Order Taking? Sig Documenting Provider Last Dose Status Informant  Ascorbic Acid (VITAMIN C) 100 MG tablet [id number] Yes Take 100 mg by mouth daily. [provider] Taking Active   aspirin EC 81 MG tablet [id number] Yes Take 81 mg by mouth daily. Swallow whole. [provider] Taking Active   cholecalciferol (VITAMIN D3) 25 MCG (1000 UT) tablet [id number] Yes Take 1,000 Units by mouth daily. [provider] Taking Active   Patient not taking:  Discontinued 09/05/20 1356 (Completed Course)   metFORMIN (GLUCOPHAGE XR) 500 MG 24 hr tablet [id number] Yes Take 2 tablets (1,000 mg total) by mouth 2 (two) times daily after a meal. Take 1 tablet by mouth once daily with food for 1 week. Then, add a second tablet. Take 1 tablet twice daily with food for 1 week. Then, add a third tablet. Take 1 tablet QAM, 2 tablets QPM for a week. Then, increase to 2 tabs BID. Einar Pheasant, MD Taking Active  zinc gluconate 50 MG tablet [id number] Yes Take 50 mg by mouth daily. [provider] Taking Active             Patient Active Problem List   Diagnosis Date Noted   Left shoulder pain 03/17/2020   Close exposure to COVID-19 virus 03/17/2020   BMI 33.0-33.9,adult 11/13/2019   Weight loss counseling, encounter for 07/17/2019   Fatigue 03/27/2019   Nocturnal oxygen desaturation 02/12/2019   Pneumonia due to COVID-19 virus 01/24/2019   Acute respiratory disease due to COVID-19 virus 01/23/2019   Syncope 01/23/2019   Health care maintenance  12/11/2015   Cervical spondylosis with radiculopathy 01/22/2015   Sinusitis 10/19/2014   AP (abdominal pain) 02/27/2014   Hypercholesterolemia 12/19/2013   Anxiety 10/09/2013   SOB (shortness of breath) 08/27/2013   Light headedness 09/25/2012   GERD (gastroesophageal reflux disease) 09/25/2012   Cervical disc disease 09/25/2012    Conditions to be addressed/monitored:  Obesity  Care Plan : Medication Management  Updates made by De Hollingshead, RPH-CPP since 09/05/2020 12:00 AM  Completed 09/05/2020   Problem: Obesity, GERD Resolved 09/05/2020     Long-Range Goal: Disease Progression Prevention Completed 09/05/2020  Start Date: 07/30/2020  Recent Progress: On track  Priority: High  Note:   Current Barriers:  Unable to achieve control of weight   Pharmacist Clinical Goal(s):  Over the next 90 days, patient will achieve control of weight as evidenced by continued weight loss  through collaboration with PharmD and provider.   Interventions: 1:1 collaboration with Einar Pheasant, MD regarding development and update of comprehensive plan of care as evidenced by provider attestation and co-signature Inter-disciplinary care team collaboration (see longitudinal plan of care) Comprehensive medication review performed; medication list updated in electronic medical record  Obesity (BMI ~30): Improved; current treatment: metformin XR 1000 mg BID Denies issues with GI upset Has tried metformin IR in the past, but discontinued due to diarrhea Previously tried weight loss programs: Optavia (lost ~20 lbs) Current weight: most recently weight: 206 lbs; starting weight: 218 lbs Current meal patterns: focusing on salads, chicken. Hamburger every now and then. Feels like he is "back on track".  Current exercise: 30 minutes a day on peloton, rowing machines; 3-4 times weekly;  Encouraged to continue current regimen at this time. Follow up with PCP in September  Supplementation: Current  treatment: vitamin C, aspirin, vitamin D, zinc   Patient Goals/Self-Care Activities Over the next 90 days, patient will:  - collaborate with provider on medication access solutions target a minimum of 150 minutes of moderate intensity exercise weekly engage in dietary modifications by reducing portion sizes, minimizing late night snacking, and avoid fast food restaurants.  Follow Up Plan: Goals of care met. Closing CCM case.      Medication Assistance:  None required.  Patient affirms current coverage meets needs.  Follow Up:  Patient requests no follow-up at this time.  Plan: Goals of care met. Closing CCM case.   Catie Darnelle Maffucci, PharmD, [place], [place] Clinical Pharmacist [employer] at [place]

## 2020-09-05 NOTE — Patient Instructions (Signed)
Visit Information   Goals Addressed               This Visit's Progress     Patient Stated     COMPLETED: Medication Management (pt-stated)        Patient Goals/Self-Care Activities Over the next 90 days, patient will:  - collaborate with provider on medication access solutions target a minimum of 150 minutes of moderate intensity exercise weekly engage in dietary modifications by reducing portion sizes, minimizing late night snacking, and avoid fast food restaurants.         Patient verbalizes understanding of instructions provided today and agrees to view in [place].   Plan: Goals of care met. Closing CCM case.   Catie Darnelle Maffucci, PharmD, [place], [place] Clinical Pharmacist [employer] at [place]

## 2020-10-08 ENCOUNTER — Encounter: Payer: Self-pay | Admitting: Internal Medicine

## 2020-10-09 ENCOUNTER — Ambulatory Visit: Payer: PRIVATE HEALTH INSURANCE | Admitting: Pharmacist

## 2020-10-09 DIAGNOSIS — Z6833 Body mass index (BMI) 33.0-33.9, adult: Secondary | ICD-10-CM

## 2020-10-09 DIAGNOSIS — E669 Obesity, unspecified: Secondary | ICD-10-CM

## 2020-10-09 MED ORDER — TIRZEPATIDE 2.5 MG/0.5ML ~~LOC~~ SOAJ: 2.5000 mg | SUBCUTANEOUS | 0 refills | Status: AC

## 2020-10-09 MED ORDER — TIRZEPATIDE 5 MG/0.5ML ~~LOC~~ SOAJ: 5.0000 mg | SUBCUTANEOUS | 1 refills | Status: DC

## 2020-10-09 NOTE — Telephone Encounter (Signed)
Catie, can you help with this.  Thanks

## 2020-10-09 NOTE — Chronic Care Management (AMB) (Signed)
Care Management   Pharmacy Note  10/09/2020 Name: Devin Humphrey MRN: [id number] DOB: 18-May-1970  Subjective: Devin Humphrey is a 50 y.o. year old male who is a primary care patient of Devin [place], MD. The Care Management team was consulted for assistance with care management and care coordination needs.    Engaged with patient by telephone for follow up visit in response to provider referral for pharmacy case management and/or care coordination services.   The patient was given information about Care Management services today including:  Care Management services includes personalized support from designated clinical staff supervised by the patient's primary care provider, including individualized plan of care and coordination with other care providers. 24/7 contact phone numbers for assistance for urgent and routine care needs. The patient may stop case management services at any time by phone call to the office staff.  Patient agreed to services and consent obtained.  Assessment:  Review of patient status, including review of consultants reports, laboratory and other test data, was performed as part of comprehensive evaluation and provision of chronic care management services.   SDOH (Social Determinants of Health) assessments and interventions performed:    Objective:  Lab Results  Component Value Date   CREATININE 1.05 11/03/2019   CREATININE 1.00 03/30/2019   CREATININE 0.95 01/27/2019    No results found for: HGBA1C     Component Value Date/Time   CHOL 151 11/03/2019 0837   TRIG 136.0 11/03/2019 0837   HDL 27.10 (L) 11/03/2019 0837   CHOLHDL 6 11/03/2019 0837   VLDL 27.2 11/03/2019 0837   LDLCALC 97 11/03/2019 0837   LDLDIRECT 156.0 03/30/2019 1500    Clinical ASCVD: No  The 10-year ASCVD risk score Devin George DC Jr., et al., 2013) is: 3.4%   Values used to calculate the score:     Age: 23 years     Sex: Male     Is Non-Hispanic African American: No      Diabetic: No     Tobacco smoker: No     Systolic Blood Pressure: 122 mmHg     Is BP treated: No     HDL Cholesterol: 27.1 mg/dL     Total Cholesterol: 151 mg/dL     BP Readings from Last 3 Encounters:  11/03/19 122/80  05/12/19 100/62  01/27/19 115/80    Care Plan  No Known Allergies  Medications Reviewed Today     Reviewed by Lourena Simmonds, RPH-CPP (Pharmacist) on 09/05/20 at 1356  Med List Status: <None>   Medication Order Taking? Sig Documenting Provider Last Dose Status Informant  Ascorbic Acid (VITAMIN C) 100 MG tablet [id number] Yes Take 100 mg by mouth daily. [provider] Taking Active   aspirin EC 81 MG tablet [id number] Yes Take 81 mg by mouth daily. Swallow whole. [provider] Taking Active   cholecalciferol (VITAMIN D3) 25 MCG (1000 UT) tablet [id number] Yes Take 1,000 Units by mouth daily. [provider] Taking Active   Patient not taking:  Discontinued 09/05/20 1356 (Completed Course)   metFORMIN (GLUCOPHAGE XR) 500 MG 24 hr tablet [id number] Yes Take 2 tablets (1,000 mg total) by mouth 2 (two) times daily after a meal. Take 1 tablet by mouth once daily with food for 1 week. Then, add a second tablet. Take 1 tablet twice daily with food for 1 week. Then, add a third tablet. Take 1 tablet QAM, 2 tablets QPM for a week. Then, increase to 2 tabs BID. Devin [place], MD Taking  Active   zinc gluconate 50 MG tablet [id number] Yes Take 50 mg by mouth daily. [provider] Taking Active             Patient Active Problem List   Diagnosis Date Noted   Left shoulder pain 03/17/2020   Close exposure to COVID-19 virus 03/17/2020   BMI 33.0-33.9,adult 11/13/2019   Weight loss counseling, encounter for 07/17/2019   Fatigue 03/27/2019   Nocturnal oxygen desaturation 02/12/2019   Pneumonia due to COVID-19 virus 01/24/2019   Acute respiratory disease due to COVID-19 virus 01/23/2019   Syncope 01/23/2019   Health care maintenance  12/11/2015   Cervical spondylosis with radiculopathy 01/22/2015   Sinusitis 10/19/2014   AP (abdominal pain) 02/27/2014   Hypercholesterolemia 12/19/2013   Anxiety 10/09/2013   SOB (shortness of breath) 08/27/2013   Light headedness 09/25/2012   GERD (gastroesophageal reflux disease) 09/25/2012   Cervical disc disease 09/25/2012    Conditions to be addressed/monitored:  Obesity  Care Plan : Medication Monitoring  Updates made by Lourena Simmonds, RPH-CPP since 10/09/2020 12:00 AM     Problem: Obesity      Long-Range Goal: Disease Progression Prevention   Start Date: 10/09/2020  This Visit's Progress: On track  Priority: High  Note:   Current Barriers:  Unable to achieve control of weight    Pharmacist Clinical Goal(s):  Over the next 90 days, patient will achieve control of weight as evidenced by continued weight loss  through collaboration with PharmD and provider.    Interventions: 1:1 collaboration with Devin [place], MD regarding development and update of comprehensive plan of care as evidenced by provider attestation and co-signature Inter-disciplinary care team collaboration (see longitudinal plan of care) Comprehensive medication review performed; medication list updated in electronic medical record  Overweight/Obesity Complicated by GERD, chronic pain: Unable to achieve goal weight loss through lifestyle modification alone; current treatment: metformin XR 1000 mg BID;  Medications/Strategies previously tried: Optavia (lost ~20 lbs) Baseline weight: 206; most recent weight: 206 Current meal patterns: focusing on salads, chicken. Hamburger every now and then. Feels like he is "back on track".  Current exercise: 30 minutes a day on peloton, rowing machines; 3-4 times weekly;  Extensive dietary counseling including education on focus on lean proteins, fruits and vegetables, whole grains and increased fiber consumption, adequate hydration Extensive exercise  counseling including eventual goal of 150 minutes of moderate intensity exercise weekly Per his pharmacy, the [facility] manufacturer program should cover cost of medication. Counseled on GLP1 agonists, including mechanism of action, side effects, and benefits. No personal or family history of medullary thyroid cancer, personal history of pancreatitis or gallbladder disease. Counseled on potential side effects of nausea, stomach upset, queasiness, constipation, and that these generally improve over time. Advised to contact our office with more severe symptoms, including nausea, diarrhea, stomach pain. Patient verbalized understanding. Start [place]jo 2.5 mg weekly for 4 weeks, then increase to 5 mg weekly. Sample of 2.5 mg provided and patient administration education completed  Supplementation: Current treatment: vitamin C, aspirin, vitamin D, zinc     Patient Goals/Self-Care Activities Over the next 90 days, patient will:  - collaborate with provider on medication access solutions target a minimum of 150 minutes of moderate intensity exercise weekly engage in dietary modifications by reducing portion sizes, minimizing late night snacking, and avoid fast food restaurants.      Medication Assistance:  None required.  Patient affirms current coverage meets needs.  Follow Up:  Patient agrees to Care Plan  and Follow-up.  Plan: Telephone follow up appointment with care management team member scheduled for:  ~ 6 weeks  [name], PharmD, [place], CPP Clinical Pharmacist [facility] at [facility] [phone]   Medication Samples have been provided to the patient.  Drug name: Greggory Keen       Strength: 2.5 mg        Qty: 1 box  LOT: [id number]  Exp.Date: 11/27/2021

## 2020-10-09 NOTE — Patient Instructions (Addendum)
Stop metformin. Start Mounarjo 2.5 mg once weekly for 4 weeks, then increase to 5 mg weekly. This medication may cause stomach upset, queasiness, or constipation, especially when first starting. This generally improves over time. Call our office if these symptoms occur and worsen, or if you have severe symptoms such as vomiting, diarrhea, or stomach pain.   Take care!  [name], PharmD [phone]  Visit Information   Goals Addressed               This Visit's Progress     Patient Stated     Medication Management (pt-stated)        Patient Goals/Self-Care Activities Over the next 90 days, patient will:  - collaborate with provider on medication access solutions target a minimum of 150 minutes of moderate intensity exercise weekly engage in dietary modifications by reducing portion sizes, minimizing late night snacking, and avoid fast food restaurants.        Patient verbalizes understanding of instructions provided today and agrees to view in MyChart.   Plan: Telephone follow up appointment with care management team member scheduled for:  ~ 6 weeks   [name], PharmD, [place], CPP Clinical Pharmacist [facility] [employer] [phone]

## 2020-11-02 ENCOUNTER — Encounter: Payer: PRIVATE HEALTH INSURANCE | Admitting: Internal Medicine

## 2020-11-21 ENCOUNTER — Telehealth: Payer: PRIVATE HEALTH INSURANCE

## 2020-11-28 ENCOUNTER — Ambulatory Visit: Payer: PRIVATE HEALTH INSURANCE | Admitting: Pharmacist

## 2020-11-28 VITALS — Wt 200.0 lb

## 2020-11-28 DIAGNOSIS — E669 Obesity, unspecified: Secondary | ICD-10-CM

## 2020-11-28 MED ORDER — TIRZEPATIDE 7.5 MG/0.5ML ~~LOC~~ SOAJ: 7.5000 mg | SUBCUTANEOUS | 2 refills | Status: DC

## 2020-11-28 NOTE — Chronic Care Management (AMB) (Signed)
Care Management   Pharmacy Note  11/28/2020 Name: Devin Humphrey MRN: [id number] DOB: Oct 14, 1970  Subjective: Devin Humphrey is a 50 y.o. year old male who is a primary care patient of [place], MD. The Care Management team was consulted for assistance with care management and care coordination needs.    Engaged with patient by telephone for follow up visit in response to provider referral for pharmacy case management and/or care coordination services.   The patient was given information about Care Management services today including:  Care Management services includes personalized support from designated clinical staff supervised by the patient's primary care provider, including individualized plan of care and coordination with other care providers. 24/7 contact phone numbers for assistance for urgent and routine care needs. The patient may stop case management services at any time by phone call to the office staff.  Patient agreed to services and consent obtained.  Assessment:  Review of patient status, including review of consultants reports, laboratory and other test data, was performed as part of comprehensive evaluation and provision of chronic care management services.   SDOH (Social Determinants of Health) assessments and interventions performed:    Objective:  Lab Results  Component Value Date   CREATININE 1.05 11/03/2019   CREATININE 1.00 03/30/2019   CREATININE 0.95 01/27/2019    No results found for: HGBA1C     Component Value Date/Time   CHOL 151 11/03/2019 0837   TRIG 136.0 11/03/2019 0837   HDL 27.10 (L) 11/03/2019 0837   CHOLHDL 6 11/03/2019 0837   VLDL 27.2 11/03/2019 0837   LDLCALC 97 11/03/2019 0837   LDLDIRECT 156.0 03/30/2019 1500      BP Readings from Last 3 Encounters:  11/03/19 122/80  05/12/19 100/62  01/27/19 115/80    Care Plan  No Known Allergies  Medications Reviewed Today     Reviewed by Lourena Simmonds, RPH-CPP  (Pharmacist) on 11/28/20 at 1618  Med List Status: <None>   Medication Order Taking? Sig Documenting Provider Last Dose Status Informant  Ascorbic Acid (VITAMIN C) 100 MG tablet [id number] Yes Take 100 mg by mouth daily. [provider] Taking Active   aspirin EC 81 MG tablet [id number]  Take 81 mg by mouth daily. Swallow whole. [provider]  Active   cholecalciferol (VITAMIN D3) 25 MCG (1000 UT) tablet [id number] Yes Take 1,000 Units by mouth daily. [provider] Taking Active   tirzepatide [facility]) 5 MG/0.5ML Pen [id number] Yes Inject 5 mg into the skin once a week. Dale [place], MD Taking Active   zinc gluconate 50 MG tablet [id number]  Take 50 mg by mouth daily. [provider]  Active             Patient Active Problem List   Diagnosis Date Noted   Left shoulder pain 03/17/2020   Close exposure to COVID-19 virus 03/17/2020   BMI 33.0-33.9,adult 11/13/2019   Weight loss counseling, encounter for 07/17/2019   Fatigue 03/27/2019   Nocturnal oxygen desaturation 02/12/2019   Pneumonia due to COVID-19 virus 01/24/2019   Acute respiratory disease due to COVID-19 virus 01/23/2019   Syncope 01/23/2019   Health care maintenance 12/11/2015   Cervical spondylosis with radiculopathy 01/22/2015   Sinusitis 10/19/2014   AP (abdominal pain) 02/27/2014   Hypercholesterolemia 12/19/2013   Anxiety 10/09/2013   SOB (shortness of breath) 08/27/2013   Light headedness 09/25/2012   GERD (gastroesophageal reflux disease) 09/25/2012   Cervical disc disease 09/25/2012    Conditions to be  addressed/monitored:  Obesity  Care Plan : Medication Monitoring  Updates made by Lourena Simmonds, RPH-CPP since 11/28/2020 12:00 AM     Problem: Obesity      Long-Range Goal: Disease Progression Prevention   Start Date: 10/09/2020  Recent Progress: On track  Priority: High  Note:   Current Barriers:  Unable to achieve control of weight    Pharmacist  Clinical Goal(s):  Over the next 90 days, patient will achieve control of weight as evidenced by continued weight loss  through collaboration with PharmD and provider.    Interventions: 1:1 collaboration with Dale [place], MD regarding development and update of comprehensive plan of care as evidenced by provider attestation and co-signature Inter-disciplinary care team collaboration (see longitudinal plan of care) Comprehensive medication review performed; medication list updated in electronic medical record  Overweight/Obesity Complicated by GERD, chronic pain: Unable to achieve goal weight loss through lifestyle modification alone; current treatment: Mounjaro 5 mg weekly x 4 weeks Denies queasiness, nausea. Endorses constipation. Discussed GLP1/GIP impact on gastric emptying. Reports feeling really good.  Medications/Strategies previously tried: Optavia (lost ~20 lbs) Baseline weight: 206; most recent weight: 200 lbs Current meal patterns: really able to notice decrease in appetite.  Current exercise: 30 minutes a day on peloton, rowing machines; 3-4 times weekly;  Reviewed dietary counseling including education on focus on lean proteins, fruits and vegetables, whole grains and increased fiber consumption, adequate hydration. Discussed fiber, hydration, and physical activity to aid in management of constipation.  Extensive exercise counseling including eventual goal of 150 minutes of moderate intensity exercise weekly Discussed increasing Mounjaro to 7.5 mg weekly. Patient amenable. Script sent. Advised that if significant GI upset, he can fill the refill for 5 mg dose and continue there for longer.   Supplementation: Current treatment: vitamin C, aspirin, vitamin D, zinc     Patient Goals/Self-Care Activities Over the next 90 days, patient will:  - collaborate with provider on medication access solutions target a minimum of 150 minutes of moderate intensity exercise weekly engage in  dietary modifications by reducing portion sizes, minimizing late night snacking, and avoid fast food restaurants.  Follow up: telephone call in 6 weeks     Medication Assistance:  None required.  Patient affirms current coverage meets needs.  Follow Up:  Patient agrees to Care Plan and Follow-up.  Plan: Telephone follow up appointment with care management team member scheduled for:  ~ 6 weeks  [name], PharmD, [place], CPP Clinical Pharmacist [facility] [employer] [phone]

## 2020-12-27 ENCOUNTER — Other Ambulatory Visit: Payer: Self-pay | Admitting: Internal Medicine

## 2020-12-27 DIAGNOSIS — E669 Obesity, unspecified: Secondary | ICD-10-CM

## 2020-12-27 DIAGNOSIS — Z6833 Body mass index (BMI) 33.0-33.9, adult: Secondary | ICD-10-CM

## 2020-12-30 ENCOUNTER — Other Ambulatory Visit: Payer: Self-pay

## 2020-12-30 ENCOUNTER — Emergency Department: Payer: PRIVATE HEALTH INSURANCE

## 2020-12-30 ENCOUNTER — Emergency Department
Admission: EM | Admit: 2020-12-30 | Discharge: 2020-12-30 | Disposition: A | Discharge status: Home / Self Care | Payer: PRIVATE HEALTH INSURANCE | Attending: Emergency Medicine | Admitting: Emergency Medicine

## 2020-12-30 ENCOUNTER — Encounter: Payer: Self-pay | Admitting: Intensive Care

## 2020-12-30 DIAGNOSIS — R197 Diarrhea, unspecified: Secondary | ICD-10-CM | POA: Diagnosis not present

## 2020-12-30 DIAGNOSIS — Z20822 Contact with and (suspected) exposure to covid-19: Secondary | ICD-10-CM | POA: Diagnosis not present

## 2020-12-30 DIAGNOSIS — R112 Nausea with vomiting, unspecified: Secondary | ICD-10-CM | POA: Diagnosis present

## 2020-12-30 LAB — CBC WITH DIFFERENTIAL/PLATELET
Abs Immature Granulocytes: 0.04 10*3/uL (ref 0.00–0.07)
Basophils Absolute: 0 10*3/uL (ref 0.0–0.1)
Basophils Relative: 0 %
Eosinophils Absolute: 0.2 10*3/uL (ref 0.0–0.5)
Eosinophils Relative: 2 %
HCT: 48 % (ref 39.0–52.0)
Hemoglobin: 16.6 g/dL (ref 13.0–17.0)
Immature Granulocytes: 0 %
Lymphocytes Relative: 16 %
Lymphs Abs: 1.9 10*3/uL (ref 0.7–4.0)
MCH: 29.3 pg (ref 26.0–34.0)
MCHC: 34.6 g/dL (ref 30.0–36.0)
MCV: 84.7 fL (ref 80.0–100.0)
Monocytes Absolute: 0.9 10*3/uL (ref 0.1–1.0)
Monocytes Relative: 8 %
Neutro Abs: 8.7 10*3/uL — ABNORMAL HIGH (ref 1.7–7.7)
Neutrophils Relative %: 74 %
Platelets: 329 10*3/uL (ref 150–400)
RBC: 5.67 MIL/uL (ref 4.22–5.81)
RDW: 13.1 % (ref 11.5–15.5)
WBC: 11.8 10*3/uL — ABNORMAL HIGH (ref 4.0–10.5)
nRBC: 0 % (ref 0.0–0.2)

## 2020-12-30 LAB — COMPREHENSIVE METABOLIC PANEL
ALT: 31 U/L (ref 0–44)
AST: 19 U/L (ref 15–41)
Albumin: 4.5 g/dL (ref 3.5–5.0)
Alkaline Phosphatase: 77 U/L (ref 38–126)
Anion gap: 9 (ref 5–15)
BUN: 13 mg/dL (ref 6–20)
CO2: 25 mmol/L (ref 22–32)
Calcium: 9 mg/dL (ref 8.9–10.3)
Chloride: 104 mmol/L (ref 98–111)
Creatinine, Ser: 1.18 mg/dL (ref 0.61–1.24)
GFR, Estimated: >60 mL/min (ref >60)
Glucose, Bld: 102 mg/dL — ABNORMAL HIGH (ref 70–99)
Potassium: 3.8 mmol/L (ref 3.5–5.1)
Sodium: 138 mmol/L (ref 135–145)
Total Bilirubin: 0.9 mg/dL (ref 0.3–1.2)
Total Protein: 7.8 g/dL (ref 6.5–8.1)

## 2020-12-30 LAB — URINALYSIS, COMPLETE (UACMP) WITH MICROSCOPIC
Bilirubin Urine: NEGATIVE
Glucose, UA: NEGATIVE mg/dL
Hgb urine dipstick: NEGATIVE
Ketones, ur: 5 mg/dL — AB
Leukocytes,Ua: NEGATIVE
Nitrite: NEGATIVE
Protein, ur: 30 mg/dL — AB
Specific Gravity, Urine: 1.025 (ref 1.005–1.030)
Squamous Epithelial / HPF: NONE SEEN (ref 0–5)
pH: 5 (ref 5.0–8.0)

## 2020-12-30 LAB — RESP PANEL BY RT-PCR (FLU A&B, COVID) ARPGX2
Influenza A by PCR: NEGATIVE
Influenza B by PCR: NEGATIVE
SARS Coronavirus 2 by RT PCR: NEGATIVE

## 2020-12-30 LAB — LIPASE, BLOOD: Lipase: 27 U/L (ref 11–51)

## 2020-12-30 MED ORDER — ONDANSETRON 4 MG PO TBDP
4.0000 mg | ORAL_TABLET | Freq: Once | ORAL | Status: AC
  Administered 2020-12-30: 4 mg via ORAL
  Filled 2020-12-30: qty 1

## 2020-12-30 MED ORDER — LACTATED RINGERS IV BOLUS
1000.0000 mL | Freq: Once | INTRAVENOUS | Status: AC
  Administered 2020-12-30: 1000 mL via INTRAVENOUS

## 2020-12-30 MED ORDER — PROMETHAZINE HCL 25 MG PO TABS: 25.0000 mg | ORAL_TABLET | Freq: Four times a day (QID) | ORAL | 0 refills | Status: DC | PRN

## 2020-12-30 NOTE — ED Triage Notes (Signed)
Patient c/o diarrhea, N/V X2 weeks. States symptoms only start at night. Reports he takes a shot for weight loss X3 months.

## 2020-12-30 NOTE — ED Provider Notes (Signed)
Emergency Medicine Provider Triage Evaluation Note  Devin Humphrey , a 50 y.o. male  was evaluated in triage.  Pt complains of presents with nausea and vomiting for 2 weeks.  Symptoms are worse at night.  Has been on a weight loss injection for 3 months.  States he is lost about 25 pounds.  No chest pain or shortness of breath.  No abdominal pain just nausea.  Review of Systems  Positive: Nausea/vomiting/diarrhea Negative: Chest pain, shortness of breath, abdominal pain  Physical Exam  BP (!) 143/89 (BP Location: Right Arm)   Pulse 98   Temp 98.5 F (36.9 C) (Oral)   Resp 20   Ht 5\' 9"  (1.753 m)   Wt 87.5 kg   SpO2 100%   BMI 28.50 kg/m  Gen:   Awake, no distress   Resp:  Normal effort  MSK:   Moves extremities without difficulty  Other:  Abdomen is nontender  Medical Decision Making  Medically screening exam initiated at 9:19 AM.  Appropriate orders placed.  Devin Humphrey was informed that the remainder of the evaluation will be completed by another provider, this initial triage assessment does not replace that evaluation, and the importance of remaining in the ED until their evaluation is complete.     Cyndi Lennert, PA-C 12/30/20 0920    13/06/22, MD 12/30/20 [phone]

## 2020-12-30 NOTE — ED Provider Notes (Signed)
[facility] Emergency Department Provider Note    Event Date/Time   First MD Initiated Contact with Patient 12/30/20 1218     (approximate)  I have reviewed the triage vital signs and the nursing notes.   HISTORY  Chief Complaint Nausea, Diarrhea, and Emesis    HPI Devin Humphrey is a 50 y.o. male the below listed past medical history presents to the ER for evaluation of several weeks of nausea vomiting and diarrhea.  Did recently start a new medication for weight loss.  He denies any abdominal pain.  No hematochezia no bloody stools.  No hematemesis.  No fevers or chills.  Feels like he is getting dehydrated.  Denies any chest pain or shortness of breath.  Past Medical History:  Diagnosis Date   Acute respiratory failure with hypoxia ([place])    CONTACT DERMATITIS&OTHER ECZEMA DUE TO PLANTS 08/19/2009   Qualifier: Diagnosis of  By: Alveta Heimlich MD, [place]     Degenerative disc disease    cervical disc disease   GERD (gastroesophageal reflux disease)    Family History  Problem Relation Age of Onset   Hyperlipidemia Father    Heart disease Father        s/p CABG and stent   Stroke Father    Hypertension Father    Colon cancer Neg Hx    Prostate cancer Neg Hx    Past Surgical History:  Procedure Laterality Date   COLONOSCOPY     UPPER GI ENDOSCOPY     Patient Active Problem List   Diagnosis Date Noted   Left shoulder pain 03/17/2020   Close exposure to COVID-19 virus 03/17/2020   BMI 33.0-33.9,adult 11/13/2019   Weight loss counseling, encounter for 07/17/2019   Fatigue 03/27/2019   Nocturnal oxygen desaturation 02/12/2019   Pneumonia due to COVID-19 virus 01/24/2019   Acute respiratory disease due to COVID-19 virus 01/23/2019   Syncope 01/23/2019   Health care maintenance 12/11/2015   Cervical spondylosis with radiculopathy 01/22/2015   Sinusitis 10/19/2014   AP (abdominal pain) 02/27/2014   Hypercholesterolemia 12/19/2013   Anxiety  10/09/2013   SOB (shortness of breath) 08/27/2013   Light headedness 09/25/2012   GERD (gastroesophageal reflux disease) 09/25/2012   Cervical disc disease 09/25/2012      Prior to Admission medications   Medication Sig Start Date End Date Taking? Authorizing Provider  promethazine (PHENERGAN) 25 MG tablet Take 1 tablet (25 mg total) by mouth every 6 (six) hours as needed. 12/30/20  Yes Merlyn Lot, MD  Ascorbic Acid (VITAMIN C) 100 MG tablet Take 100 mg by mouth daily.    [provider]  aspirin EC 81 MG tablet Take 81 mg by mouth daily. Swallow whole.    [provider]  cholecalciferol (VITAMIN D3) 25 MCG (1000 UT) tablet Take 1,000 Units by mouth daily.    [provider]  tirzepatide [facility]) 7.5 MG/0.5ML Pen Inject 7.5 mg into the skin once a week. 11/28/20   Einar Pheasant, MD  zinc gluconate 50 MG tablet Take 50 mg by mouth daily.    [provider]    Allergies Patient has no known allergies.    Social History Social History   Tobacco Use   Smoking status: Never   Smokeless tobacco: Never  Substance Use Topics   Alcohol use: Yes    Alcohol/week: 0.0 standard drinks    Comment: occasional   Drug use: No    Review of Systems Patient denies headaches, rhinorrhea, blurry  vision, numbness, shortness of breath, chest pain, edema, cough, abdominal pain, nausea, vomiting, diarrhea, dysuria, fevers, rashes or hallucinations unless otherwise stated above in HPI. ____________________________________________   PHYSICAL EXAM:  VITAL SIGNS: Vitals:   12/30/20 0915 12/30/20 1310  BP: (!) 143/89 117/81  Pulse: 98 89  Resp: 20 17  Temp: 98.5 F (36.9 C)   SpO2: 100% 100%    Constitutional: Alert and oriented.  Eyes: Conjunctivae are normal.  Head: Atraumatic. Nose: No congestion/rhinnorhea. Mouth/Throat: Mucous membranes are moist.   Neck: No stridor. Painless ROM.  Cardiovascular: Normal rate, regular rhythm. Grossly  normal heart sounds.  Good peripheral circulation. Respiratory: Normal respiratory effort.  No retractions. Lungs CTAB. Gastrointestinal: Soft and nontender. No distention. No abdominal bruits. No CVA tenderness. Genitourinary:  Musculoskeletal: No lower extremity tenderness nor edema.  No joint effusions. Neurologic:  Normal speech and language. No gross focal neurologic deficits are appreciated. No facial droop Skin:  Skin is warm, dry and intact. No rash noted. Psychiatric: Mood and affect are normal. Speech and behavior are normal.  ____________________________________________   LABS (all labs ordered are listed, but only abnormal results are displayed)  Results for orders placed or performed during the hospital encounter of 12/30/20 (from the past 24 hour(s))  Comprehensive metabolic panel     Status: Abnormal   Collection Time: 12/30/20  9:23 AM  Result Value Ref Range   Sodium 138 135 - 145 mmol/L   Potassium 3.8 3.5 - 5.1 mmol/L   Chloride 104 98 - 111 mmol/L   CO2 25 22 - 32 mmol/L   Glucose, Bld 102 (H) 70 - 99 mg/dL   BUN 13 6 - 20 mg/dL   Creatinine, Ser 1.18 0.61 - 1.24 mg/dL   Calcium 9.0 8.9 - 10.3 mg/dL   Total Protein 7.8 6.5 - 8.1 g/dL   Albumin 4.5 3.5 - 5.0 g/dL   AST 19 15 - 41 U/L   ALT 31 0 - 44 U/L   Alkaline Phosphatase 77 38 - 126 U/L   Total Bilirubin 0.9 0.3 - 1.2 mg/dL   GFR, Estimated >60 >60 mL/min   Anion gap 9 5 - 15  Lipase, blood     Status: None   Collection Time: 12/30/20  9:23 AM  Result Value Ref Range   Lipase 27 11 - 51 U/L  CBC with Differential     Status: Abnormal   Collection Time: 12/30/20  9:23 AM  Result Value Ref Range   WBC 11.8 (H) 4.0 - 10.5 K/uL   RBC 5.67 4.22 - 5.81 MIL/uL   Hemoglobin 16.6 13.0 - 17.0 g/dL   HCT 48.0 39.0 - 52.0 %   MCV 84.7 80.0 - 100.0 fL   MCH 29.3 26.0 - 34.0 pg   MCHC 34.6 30.0 - 36.0 g/dL   RDW 13.1 11.5 - 15.5 %   Platelets 329 150 - 400 K/uL   nRBC 0.0 0.0 - 0.2 %   Neutrophils Relative  % 74 %   Neutro Abs 8.7 (H) 1.7 - 7.7 K/uL   Lymphocytes Relative 16 %   Lymphs Abs 1.9 0.7 - 4.0 K/uL   Monocytes Relative 8 %   Monocytes Absolute 0.9 0.1 - 1.0 K/uL   Eosinophils Relative 2 %   Eosinophils Absolute 0.2 0.0 - 0.5 K/uL   Basophils Relative 0 %   Basophils Absolute 0.0 0.0 - 0.1 K/uL   Immature Granulocytes 0 %   Abs Immature Granulocytes 0.04 0.00 - 0.07 K/uL  Resp Panel by RT-PCR (Flu A&B, Covid) Nasopharyngeal Swab     Status: None   Collection Time: 12/30/20  9:23 AM   Specimen: Nasopharyngeal Swab; Nasopharyngeal(NP) swabs in vial transport medium  Result Value Ref Range   SARS Coronavirus 2 by RT PCR NEGATIVE NEGATIVE   Influenza A by PCR NEGATIVE NEGATIVE   Influenza B by PCR NEGATIVE NEGATIVE  Urinalysis, Complete w Microscopic Urine, Clean Catch     Status: Abnormal   Collection Time: 12/30/20  2:00 PM  Result Value Ref Range   Color, Urine YELLOW (A) YELLOW   APPearance CLOUDY (A) CLEAR   Specific Gravity, Urine 1.025 1.005 - 1.030   pH 5.0 5.0 - 8.0   Glucose, UA NEGATIVE NEGATIVE mg/dL   Hgb urine dipstick NEGATIVE NEGATIVE   Bilirubin Urine NEGATIVE NEGATIVE   Ketones, ur 5 (A) NEGATIVE mg/dL   Protein, ur 30 (A) NEGATIVE mg/dL   Nitrite NEGATIVE NEGATIVE   Leukocytes,Ua NEGATIVE NEGATIVE   RBC / HPF 0-5 0 - 5 RBC/hpf   WBC, UA 11-20 0 - 5 WBC/hpf   Bacteria, UA RARE (A) NONE SEEN   Squamous Epithelial / LPF NONE SEEN 0 - 5   Mucus PRESENT    ____________________________________________ ____________________________________________  RADIOLOGY  I personally reviewed all radiographic images ordered to evaluate for the above acute complaints and reviewed radiology reports and findings.  These findings were personally discussed with the patient.  Please see medical record for radiology report.  ____________________________________________   PROCEDURES  Procedure(s) performed:  Procedures    Critical Care performed:  no ____________________________________________   INITIAL IMPRESSION / ASSESSMENT AND PLAN / ED COURSE  Pertinent labs & imaging results that were available during my care of the patient were reviewed by me and considered in my medical decision making (see chart for details).   DDX: Enteritis, medication effect, IBD, obstruction, diverticulitis, colitis, appendicitis, pancreatitis, biliary pathology, hepatitis  Devin Humphrey is a 50 y.o. who presents to the ED with presentation as described above.  Patient clinically well-appearing hemodynamically stable with reassuring blood work.  Does have a borderline leukocytosis but no acidemia his blood work is very reassuring no significant dehydration.  Does feel somewhat better after Zofran.  His abdominal exam is soft and benign.  X-ray without evidence of obstructive pattern.  Clinical Course as of 12/30/20 1424  Sun Dec 30, 2020  1412 Patient feeling significantly improved after IV fluids.  His work-up is overall reassuring.  While differential remains broad he is well-appearing with reassuring work-up and I have a high suspicion this is related to recent medication that he started as opposed to infectious symptoms.  He is tolerating.  Wound does appear appropriate for outpatient follow-up. [PR]    Clinical Course User Index [PR] Willy Eddy, MD    Urinalysis did show few whites as well as rare bacteria he is denying any dysuria or increased frequency or urgency.  I have sent for culture.  If it does grow bacteria would call back to see if he develop any symptoms and treat with antibiotics otherwise do not believe that antibiotics would be indicated.  The patient was evaluated in Emergency Department today for the symptoms described in the history of present illness. He/she was evaluated in the context of the global COVID-19 pandemic, which necessitated consideration that the patient might be at risk for infection with the SARS-CoV-2  virus that causes COVID-19. Institutional protocols and algorithms that pertain to the evaluation of patients at risk  for COVID-19 are in a state of rapid change based on information released by regulatory bodies including the CDC and federal and state organizations. These policies and algorithms were followed during the patient's care in the ED.  As part of my medical decision making, I reviewed the following data within the [place] notes reviewed and incorporated, Labs reviewed, notes from prior ED visits and [place] Controlled Substance Database   ____________________________________________   FINAL CLINICAL IMPRESSION(S) / ED DIAGNOSES  Final diagnoses:  Nausea vomiting and diarrhea      NEW MEDICATIONS STARTED DURING THIS VISIT:  New Prescriptions   PROMETHAZINE (PHENERGAN) 25 MG TABLET    Take 1 tablet (25 mg total) by mouth every 6 (six) hours as needed.     Note:  This document was prepared using Dragon voice recognition software and may include unintentional dictation errors.    Merlyn Lot, MD 12/30/20 1425

## 2021-01-01 ENCOUNTER — Encounter: Payer: Self-pay | Admitting: Internal Medicine

## 2021-01-01 LAB — URINE CULTURE: Culture: <10000 — AB

## 2021-01-02 MED ORDER — TIRZEPATIDE 2.5 MG/0.5ML ~~LOC~~ SOAJ: 2.5000 mg | SUBCUTANEOUS | 2 refills | Status: DC

## 2021-01-02 NOTE — Telephone Encounter (Signed)
Rx sent in for mounjaro 2.5 with 2 refills.

## 2021-01-16 ENCOUNTER — Ambulatory Visit: Payer: PRIVATE HEALTH INSURANCE | Admitting: Pharmacist

## 2021-01-16 DIAGNOSIS — E7800 Pure hypercholesterolemia, unspecified: Secondary | ICD-10-CM

## 2021-01-16 DIAGNOSIS — Z6833 Body mass index (BMI) 33.0-33.9, adult: Secondary | ICD-10-CM

## 2021-01-16 NOTE — Patient Instructions (Signed)
Visit Information  Following are the goals we discussed today:  Patient Goals/Self-Care Activities Over the next 90 days, patient will:  - collaborate with provider on medication access solutions target a minimum of 150 minutes of moderate intensity exercise weekly engage in dietary modifications by reducing portion sizes, minimizing late night snacking, and avoid fast food restaurants.        Plan: Telephone follow up appointment with care management team member scheduled for:  10 weeks   [name], PharmD, [place], CPP Clinical Pharmacist [facility] at [facility] [phone]     Please call the care guide team at [phone] if you need to cancel or reschedule your appointment.   Patient verbalizes understanding of instructions provided today and agrees to view in MyChart.

## 2021-01-16 NOTE — Chronic Care Management (AMB) (Signed)
Chronic Care Management CCM Pharmacy Note  01/16/2021 Name:  Devin Humphrey MRN:  [id number] DOB:  June 12, 1970  Summary: - Acute episode of nausea, vomiting, diarrhea. Mounjaro held for several weeks. Recently restarted and is tolerating  Recommendations/Changes made from today's visit: - Continue Mounjaro 2.5 mg weekly  Subjective: Devin Humphrey is an 50 y.o. year old male who is a primary patient of Dale Walnut, MD.  The CCM team was consulted for assistance with disease management and care coordination needs.    Engaged with patient by telephone for follow up visit for pharmacy case management and/or care coordination services.   Objective:  Medications Reviewed Today     Reviewed by Lourena Simmonds, RPH-CPP (Pharmacist) on 01/16/21 at 1502  Med List Status: <None>   Medication Order Taking? Sig Documenting Provider Last Dose Status Informant  Ascorbic Acid (VITAMIN C) 100 MG tablet [id number]  Take 100 mg by mouth daily. [provider]  Active   aspirin EC 81 MG tablet [id number]  Take 81 mg by mouth daily. Swallow whole. [provider]  Active   cholecalciferol (VITAMIN D3) 25 MCG (1000 UT) tablet [id number]  Take 1,000 Units by mouth daily. [provider]  Active   promethazine (PHENERGAN) 25 MG tablet [id number]  Take 1 tablet (25 mg total) by mouth every 6 (six) hours as needed. Willy Eddy, MD  Active   tirzepatide [facility]) 2.5 MG/0.5ML Pen [id number] Yes Inject 2.5 mg into the skin once a week. Dale [place], MD Taking Active   zinc gluconate 50 MG tablet [id number]  Take 50 mg by mouth daily. [provider]  Active             Pertinent Labs:   No results found for: HGBA1C Lab Results  Component Value Date   CHOL 151 11/03/2019   HDL 27.10 (L) 11/03/2019   LDLCALC 97 11/03/2019   LDLDIRECT 156.0 03/30/2019   TRIG 136.0 11/03/2019   CHOLHDL 6 11/03/2019   Lab Results  Component Value Date    CREATININE 1.18 12/30/2020   BUN 13 12/30/2020   NA 138 12/30/2020   K 3.8 12/30/2020   CL 104 12/30/2020   CO2 25 12/30/2020    SDOH:  (Social Determinants of Health) assessments and interventions performed:  SDOH Interventions    Flowsheet Row Most Recent Value  SDOH Interventions   Financial Strain Interventions Intervention Not Indicated       CCM Care Plan  Review of patient past medical history, allergies, medications, health status, including review of consultants reports, laboratory and other test data, was performed as part of comprehensive evaluation and provision of chronic care management services.   Care Plan : Medication Monitoring  Updates made by Lourena Simmonds, RPH-CPP since 01/16/2021 12:00 AM     Problem: Obesity      Long-Range Goal: Disease Progression Prevention   Start Date: 10/09/2020  Recent Progress: On track  Priority: High  Note:   Current Barriers:  Unable to achieve control of weight    Pharmacist Clinical Goal(s):  Over the next 90 days, patient will achieve control of weight as evidenced by continued weight loss  through collaboration with PharmD and provider.    Interventions: 1:1 collaboration with Dale [place], MD regarding development and update of comprehensive plan of care as evidenced by provider attestation and co-signature Inter-disciplinary care team collaboration (see longitudinal plan of care) Comprehensive medication review performed; medication list updated in electronic medical record  Overweight/Obesity Complicated  by GERD, chronic pain: Unable to achieve goal weight loss through lifestyle modification alone; current treatment: Mounjaro 2.5 mg weekly x 1 week Reports severe nausea, vomiting, diarrhea about a month ago that was persistent for several days. Resulted in ED visit, required fluids. He reports that he is feeling completely back to baseline, so decided to restart Mounjaro last week.   Medications/Strategies previously tried: Optavia (lost ~20 lbs) Baseline weight: 206; most recent weight: 193 lbs Current meal patterns: really able to notice decrease in appetite.  Current exercise: 30 minutes a day on peloton, rowing machines; 3-4 times weekly;  Unsure if medication vs viral illness that resulted in ED visit, as patient had been stable on therapy before. Agree with cautious reinitiation of therapy. Continue Mounjaro 2.5 mg weekly. Follow up with PCP in 4 weeks as scheduled.   Supplementation: Current treatment: vitamin C, aspirin, vitamin D, zinc     Patient Goals/Self-Care Activities Over the next 90 days, patient will:  - collaborate with provider on medication access solutions target a minimum of 150 minutes of moderate intensity exercise weekly engage in dietary modifications by reducing portion sizes, minimizing late night snacking, and avoid fast food restaurants.      Plan: Telephone follow up appointment with care management team member scheduled for:  10 weeks  [name], PharmD, [place], CPP Clinical Pharmacist [employer] [facility] [employer] [phone]

## 2021-01-24 NOTE — Progress Notes (Signed)
[facility] - Clinic Note  01/28/2021     CHIEF COMPLAINT Patient presents for Retina Evaluation   HISTORY OF PRESENT ILLNESS: Devin Humphrey is a 50 y.o. male who presents to the clinic today for:   HPI     Retina Evaluation   In both eyes.  This started 1 week ago.  Duration of 1 week.  I, the attending physician,  performed the HPI with the patient and updated documentation appropriately.        Comments   Pt here as a referral from Dr. Jennette Kettle McMackin at [facility] for clearance for lasik sx. Pt does not report any recent change in vision. Pt reports full time rx specs or contact lens wear. Pt is wearing glasses today and reports they are less than 51 year old. No hx of any ocular issues.       Last edited by Rennis Chris, MD on 01/29/2021 12:38 PM.    Pt is here on the referral of Dr. Graciella Freer for retinal clearance for lasik, pt states Dr. Graciella Freer was concerned about lattice degeneration  Referring physician: Dr. Graciella Freer at [facility]  HISTORICAL INFORMATION:   Selected notes from the MEDICAL RECORD NUMBER Referred by Dr. Graciella Freer for retina clearance for LASIK, eval lattice degen OU LEE:  Ocular Hx- PMH-    CURRENT MEDICATIONS: No current outpatient medications on file. (Ophthalmic Drugs)   No current facility-administered medications for this visit. (Ophthalmic Drugs)   Current Outpatient Medications (Other)  Medication Sig   Ascorbic Acid (VITAMIN C) 100 MG tablet Take 100 mg by mouth daily.   aspirin EC 81 MG tablet Take 81 mg by mouth daily. Swallow whole.   cholecalciferol (VITAMIN D3) 25 MCG (1000 UT) tablet Take 1,000 Units by mouth daily.   Multiple Vitamins-Minerals (MULTIVITAMIN WITH MINERALS) tablet Take 1 tablet by mouth daily.   thiamine (VITAMIN B-1) 50 MG tablet Take 50 mg by mouth daily.   tirzepatide [facility]) 2.5 MG/0.5ML Pen Inject 2.5 mg into the skin once a week.   promethazine (PHENERGAN) 25 MG tablet Take 1 tablet (25 mg  total) by mouth every 6 (six) hours as needed. (Patient not taking: Reported on 01/28/2021)   zinc gluconate 50 MG tablet Take 50 mg by mouth daily. (Patient not taking: Reported on 01/28/2021)   No current facility-administered medications for this visit. (Other)   REVIEW OF SYSTEMS: ROS   Positive for: Musculoskeletal Negative for: Constitutional, Gastrointestinal, Neurological, Skin, Genitourinary, HENT, Endocrine, Cardiovascular, Eyes, Respiratory, Psychiatric, Allergic/Imm, Heme/Lymph Last edited by Thompson Grayer, COT on 01/28/2021  9:06 AM.     ALLERGIES No Known Allergies  PAST MEDICAL HISTORY Past Medical History:  Diagnosis Date   Acute respiratory failure with hypoxia (HCC)    CONTACT DERMATITIS&OTHER ECZEMA DUE TO PLANTS 08/19/2009   Qualifier: Diagnosis of  By: Thurmond Butts MD, [place]     Degenerative disc disease    cervical disc disease   GERD (gastroesophageal reflux disease)    Past Surgical History:  Procedure Laterality Date   COLONOSCOPY     UPPER GI ENDOSCOPY      FAMILY HISTORY Family History  Problem Relation Age of Onset   Hyperlipidemia Father    Heart disease Father        s/p CABG and stent   Stroke Father    Hypertension Father    Colon cancer Neg Hx    Prostate cancer Neg Hx     SOCIAL HISTORY Social History  Tobacco Use   Smoking status: Never   Smokeless tobacco: Never  Substance Use Topics   Alcohol use: Yes    Alcohol/week: 0.0 standard drinks    Comment: occasional   Drug use: No       OPHTHALMIC EXAM:  Base Eye Exam     Visual Acuity (Snellen - Linear)       Right Left   Dist cc 20/20 -1 20/20 -2    Correction: Glasses         Tonometry (Tonopen, 9:17 AM)       Right Left   Pressure 18 15         Pupils       Dark Light Shape React APD   Right 3 2 Round Brisk None   Left 3 2 Round Brisk None         Visual Fields (Counting fingers)       Left Right    Full Full         Extraocular Movement        Right Left    Full, Ortho Full, Ortho         Neuro/Psych     Oriented x3: Yes   Mood/Affect: Normal         Dilation     Both eyes: 1.0% Mydriacyl, 2.5% Phenylephrine @ 9:18 AM           Slit Lamp and Fundus Exam     Slit Lamp Exam       Right Left   Lids/Lashes Normal Normal   Conjunctiva/Sclera Mild nasal pingeucula Mild nasal pingeucula   Cornea 1+ Punctate epithelial erosions, tear film debris 1+ Punctate epithelial erosions, tear film debris   Anterior Chamber Deep and quiet Deep and quiet   Iris Round and dilated Round and dilated   Lens Clear Clear   Anterior Vitreous Vitreous syneresis, Posterior vitreous detachment, vitreous condensations Vitreous syneresis, vitreous condensations         Fundus Exam       Right Left   Disc Tilted disc, Pink and Sharp, mild PPP superiorly Pink and Sharp, Tilted cup, mild PPA   C/D Ratio 0.2 0.2   Macula Flat, Good foveal reflex, RPE mottling Flat, Good foveal reflex, RPE mottling, No heme or edema   Vessels Trace Copper wiring Trace Copper wiring   Periphery Attached, pigmented lattice at 0430, mild pavingstone inferiorly, multiple patches of pigmented lattice w/ atrophic holes ST and IT quad Attached, pigmented lattice superiorly from 1130-0200 with atrophic hole at 0200, pigmented lattice at 0330-0700 with atrophic hole at 0500           Refraction     Wearing Rx       Sphere Cylinder Axis   Right -9.25 +1.25 125   Left -10.25 +1.50 029         Manifest Refraction       Sphere Cylinder Axis Dist VA   Right -9.75 +1.25 140 20/20-1   Left -10.25 +1.75 030 20/20-1            IMAGING AND PROCEDURES  Imaging and Procedures for 01/28/2021  OCT, Retina - OU - Both Eyes       Right Eye Quality was good. Central Foveal Thickness: 300. Progression has no prior data. Findings include normal foveal contour, no IRF, no SRF, myopic contour (Partial PVD).   Left Eye Quality was good. Central  Foveal Thickness: 301. Progression has no prior data. Findings include  normal foveal contour, no IRF, no SRF, vitreomacular adhesion .   Notes *Images captured and stored on drive  Diagnosis / Impression:  NFP, no IRF/SRF OU  Clinical management:  See below  Abbreviations: NFP - Normal foveal profile. CME - cystoid macular edema. PED - pigment epithelial detachment. IRF - intraretinal fluid. SRF - subretinal fluid. EZ - ellipsoid zone. ERM - epiretinal membrane. ORA - outer retinal atrophy. ORT - outer retinal tubulation. SRHM - subretinal hyper-reflective material. IRHM - intraretinal hyper-reflective material            ASSESSMENT/PLAN:    ICD-10-CM   1. Bilateral retinal lattice degeneration  H35.413     2. Retinal hole of both eyes  H33.323     3. Retinal edema  H35.81 OCT, Retina - OU - Both Eyes    4. Diabetes mellitus type 2 without retinopathy (HCC)  E11.9     5. Severe myopia of both eyes  H52.13      1-3. Lattice degeneration w/ atrophic holes, both eyes - OD: pigmented lattice at 0430, multiple patches of pigmented lattice w/ atrophic holes ST and IT quad - OS: pigmented lattice superiorly from 1130-0200 with atrophic hole at 0200, pigmented lattice at 0330-0700 with atrophic hole at 0500 - discussed findings, prognosis, and treatment options - recommend laser retinopexy OU, OS first - pt will come back Thursday, December 8 for laser, DFE OS only - will clear pt for LASIK once retinopexy completed and stable OU  4. Diabetes mellitus, type 2 without retinopathy - The incidence, risk factors for progression, natural history and treatment options for diabetic retinopathy  were discussed with patient.   - The need for close monitoring of blood glucose, blood pressure, and serum lipids, avoiding cigarette or any type of tobacco, and the need for long term follow up was also discussed with patient. - f/u in 1 year, sooner prn  5. High myopia  - planning for LASIK  w/ Dr. Guido Sander  - will clear pt for LASIK once retinopexy completed and stable OU  Ophthalmic Meds Ordered this visit:  No orders of the defined types were placed in this encounter.    Return in about 3 days (around 01/31/2021) for f/u lattice degeneration OU, DFE, OCT.  There are no Patient Instructions on file for this visit.   Explained the diagnoses, plan, and follow up with the patient and they expressed understanding.  Patient expressed understanding of the importance of proper follow up care.   This document serves as a record of services personally performed by Karie Chimera, MD, PhD. It was created on their behalf by Annalee Genta, COMT. The creation of this record is the provider's dictation and/or activities during the visit.  Electronically signed by: Annalee Genta, COMT 01/29/21 12:52 PM  This document serves as a record of services personally performed by Karie Chimera, MD, PhD. It was created on their behalf by Glee Arvin. Manson Passey, OA an ophthalmic technician. The creation of this record is the provider's dictation and/or activities during the visit.    Electronically signed by: Glee Arvin. Manson Passey, [place] 12.05.2022 12:52 PM   Karie Chimera, M.D., Ph.D. Diseases & Surgery of the Retina and Vitreous [facility]  I have reviewed the above documentation for accuracy and completeness, and I agree with the above. Karie Chimera, M.D., Ph.D. 01/29/21 12:52 PM   Abbreviations: M myopia (nearsighted); A astigmatism; H hyperopia (farsighted); P presbyopia; Mrx spectacle prescription;  CTL contact lenses; OD right eye; OS left eye; OU both eyes  XT exotropia; ET esotropia; PEK punctate epithelial keratitis; PEE punctate epithelial erosions; DES dry eye syndrome; MGD meibomian gland dysfunction; ATs artificial tears; PFAT's preservative free artificial tears; NSC nuclear sclerotic cataract; PSC posterior subcapsular cataract; ERM epi-retinal membrane; PVD  posterior vitreous detachment; RD retinal detachment; DM diabetes mellitus; DR diabetic retinopathy; NPDR non-proliferative diabetic retinopathy; PDR proliferative diabetic retinopathy; CSME clinically significant macular edema; DME diabetic macular edema; dbh dot blot hemorrhages; CWS cotton wool spot; POAG primary open angle glaucoma; C/D cup-to-disc ratio; HVF humphrey visual field; GVF goldmann visual field; OCT optical coherence tomography; IOP intraocular pressure; BRVO Branch retinal vein occlusion; CRVO central retinal vein occlusion; CRAO central retinal artery occlusion; BRAO branch retinal artery occlusion; RT retinal tear; SB scleral buckle; PPV pars plana vitrectomy; VH Vitreous hemorrhage; PRP panretinal laser photocoagulation; IVK intravitreal kenalog; VMT vitreomacular traction; MH Macular hole;  NVD neovascularization of the disc; NVE neovascularization elsewhere; AREDS age related eye disease study; ARMD age related macular degeneration; POAG primary open angle glaucoma; EBMD epithelial/anterior basement membrane dystrophy; ACIOL anterior chamber intraocular lens; IOL intraocular lens; PCIOL posterior chamber intraocular lens; Phaco/IOL phacoemulsification with intraocular lens placement; PRK photorefractive keratectomy; LASIK laser assisted in situ keratomileusis; HTN hypertension; DM diabetes mellitus; COPD chronic obstructive pulmonary disease

## 2021-01-28 ENCOUNTER — Encounter (INDEPENDENT_AMBULATORY_CARE_PROVIDER_SITE_OTHER): Payer: Self-pay | Admitting: Ophthalmology

## 2021-01-28 ENCOUNTER — Other Ambulatory Visit: Payer: Self-pay

## 2021-01-28 ENCOUNTER — Ambulatory Visit (INDEPENDENT_AMBULATORY_CARE_PROVIDER_SITE_OTHER): Payer: Self-pay | Admitting: Ophthalmology

## 2021-01-28 DIAGNOSIS — E119 Type 2 diabetes mellitus without complications: Secondary | ICD-10-CM

## 2021-01-28 DIAGNOSIS — H33323 Round hole, bilateral: Secondary | ICD-10-CM

## 2021-01-28 DIAGNOSIS — H3581 Retinal edema: Secondary | ICD-10-CM

## 2021-01-28 DIAGNOSIS — H5213 Myopia, bilateral: Secondary | ICD-10-CM

## 2021-01-28 DIAGNOSIS — H35413 Lattice degeneration of retina, bilateral: Secondary | ICD-10-CM

## 2021-01-28 LAB — HM DIABETES EYE EXAM

## 2021-01-29 ENCOUNTER — Encounter (INDEPENDENT_AMBULATORY_CARE_PROVIDER_SITE_OTHER): Payer: Self-pay | Admitting: Ophthalmology

## 2021-01-29 ENCOUNTER — Encounter: Payer: Self-pay | Admitting: Internal Medicine

## 2021-01-29 NOTE — Progress Notes (Signed)
Eye exam abstracted. 

## 2021-01-31 ENCOUNTER — Other Ambulatory Visit: Payer: Self-pay

## 2021-01-31 ENCOUNTER — Encounter (INDEPENDENT_AMBULATORY_CARE_PROVIDER_SITE_OTHER): Payer: Self-pay | Admitting: Ophthalmology

## 2021-01-31 ENCOUNTER — Ambulatory Visit (INDEPENDENT_AMBULATORY_CARE_PROVIDER_SITE_OTHER): Payer: Self-pay | Admitting: Ophthalmology

## 2021-01-31 DIAGNOSIS — H35413 Lattice degeneration of retina, bilateral: Secondary | ICD-10-CM

## 2021-01-31 DIAGNOSIS — H3581 Retinal edema: Secondary | ICD-10-CM

## 2021-01-31 DIAGNOSIS — H33323 Round hole, bilateral: Secondary | ICD-10-CM

## 2021-01-31 DIAGNOSIS — E119 Type 2 diabetes mellitus without complications: Secondary | ICD-10-CM

## 2021-01-31 MED ORDER — PREDNISOLONE ACETATE 1 % OP SUSP: 1.0000 [drp] | Freq: Four times a day (QID) | OPHTHALMIC | 0 refills | Status: DC

## 2021-01-31 MED ORDER — PREDNISOLONE ACETATE 1 % OP SUSP
1.0000 [drp] | Freq: Four times a day (QID) | OPHTHALMIC | 0 refills | Status: DC
Start: 2021-01-31 — End: 2021-08-13

## 2021-01-31 NOTE — Progress Notes (Signed)
[facility] - Clinic Note  01/31/2021     CHIEF COMPLAINT Patient presents for Retina Follow Up   HISTORY OF PRESENT ILLNESS: Devin Humphrey is a 50 y.o. male who presents to the clinic today for:   HPI     Retina Follow Up   Patient presents with  Other.  In both eyes.  This started 3 days ago.  I, the attending physician,  performed the HPI with the patient and updated documentation appropriately.        Comments   Patient here for 3 days retina follow up for laser OS.      Last edited by Rennis Chris, MD on 01/31/2021 11:10 AM.    Pt here for laser retinopexy OS  Referring physician: Dr. Graciella Freer at [facility]  HISTORICAL INFORMATION:   Selected notes from the MEDICAL RECORD NUMBER Referred by Dr. Graciella Freer for retina clearance for LASIK, eval lattice degen OU LEE:  Ocular Hx- PMH-    CURRENT MEDICATIONS: Current Outpatient Medications (Ophthalmic Drugs)  Medication Sig   prednisoLONE acetate (PRED FORTE) 1 % ophthalmic suspension Place 1 drop into the left eye 4 (four) times daily.   No current facility-administered medications for this visit. (Ophthalmic Drugs)   Current Outpatient Medications (Other)  Medication Sig   Ascorbic Acid (VITAMIN C) 100 MG tablet Take 100 mg by mouth daily.   aspirin EC 81 MG tablet Take 81 mg by mouth daily. Swallow whole.   cholecalciferol (VITAMIN D3) 25 MCG (1000 UT) tablet Take 1,000 Units by mouth daily.   Multiple Vitamins-Minerals (MULTIVITAMIN WITH MINERALS) tablet Take 1 tablet by mouth daily.   promethazine (PHENERGAN) 25 MG tablet Take 1 tablet (25 mg total) by mouth every 6 (six) hours as needed. (Patient not taking: Reported on 01/28/2021)   thiamine (VITAMIN B-1) 50 MG tablet Take 50 mg by mouth daily.   tirzepatide [facility]) 2.5 MG/0.5ML Pen Inject 2.5 mg into the skin once a week.   zinc gluconate 50 MG tablet Take 50 mg by mouth daily. (Patient not taking: Reported on 01/28/2021)   No  current facility-administered medications for this visit. (Other)   REVIEW OF SYSTEMS: ROS   Positive for: Musculoskeletal, Eyes Negative for: Constitutional, Gastrointestinal, Neurological, Skin, Genitourinary, HENT, Endocrine, Cardiovascular, Respiratory, Psychiatric, Allergic/Imm, Heme/Lymph Last edited by Laddie Aquas, COA on 01/31/2021  9:35 AM.     ALLERGIES No Known Allergies  PAST MEDICAL HISTORY Past Medical History:  Diagnosis Date   Acute respiratory failure with hypoxia (HCC)    CONTACT DERMATITIS&OTHER ECZEMA DUE TO PLANTS 08/19/2009   Qualifier: Diagnosis of  By: Thurmond Butts MD, [place]     Degenerative disc disease    cervical disc disease   GERD (gastroesophageal reflux disease)    Past Surgical History:  Procedure Laterality Date   COLONOSCOPY     UPPER GI ENDOSCOPY      FAMILY HISTORY Family History  Problem Relation Age of Onset   Hyperlipidemia Father    Heart disease Father        s/p CABG and stent   Stroke Father    Hypertension Father    Colon cancer Neg Hx    Prostate cancer Neg Hx     SOCIAL HISTORY Social History   Tobacco Use   Smoking status: Never   Smokeless tobacco: Never  Substance Use Topics   Alcohol use: Yes    Alcohol/week: 0.0 standard drinks    Comment: occasional   Drug  use: No       OPHTHALMIC EXAM:  Base Eye Exam     Visual Acuity (Snellen - Linear)       Right Left   Dist cc 20/20 20/20    Correction: Glasses         Tonometry (Tonopen, 9:34 AM)       Right Left   Pressure 18 19         Neuro/Psych     Oriented x3: Yes   Mood/Affect: Normal         Dilation     Left eye: 1.0% Mydriacyl, 2.5% Phenylephrine @ 9:33 AM           Slit Lamp and Fundus Exam     Slit Lamp Exam       Right Left   Lids/Lashes Normal Normal   Conjunctiva/Sclera Mild nasal pingeucula Mild nasal pingeucula   Cornea 1+ Punctate epithelial erosions, tear film debris 1+ Punctate epithelial erosions, tear film  debris   Anterior Chamber Deep and quiet Deep and quiet   Iris Round and dilated Round and dilated   Lens Clear Clear   Anterior Vitreous Vitreous syneresis, Posterior vitreous detachment, vitreous condensations Vitreous syneresis, vitreous condensations         Fundus Exam       Right Left   Disc Tilted disc, Pink and Sharp, mild PPP superiorly Pink and Sharp, Tilted cup, mild PPA   C/D Ratio 0.2 0.2   Macula Flat, Good foveal reflex, RPE mottling Flat, Good foveal reflex, RPE mottling, No heme or edema   Vessels Trace Copper wiring Trace Copper wiring   Periphery Attached, pigmented lattice at 0430, mild pavingstone inferiorly, multiple patches of pigmented lattice w/ atrophic holes ST and IT quad Attached, pigmented lattice superiorly from 1130-0200 with atrophic hole at 0200, pigmented lattice at 0330-0700 with atrophic hole at 0500           Refraction     Wearing Rx       Sphere Cylinder Axis   Right -9.25 +1.25 125   Left -10.25 +1.50 029            IMAGING AND PROCEDURES  Imaging and Procedures for 01/31/2021  Repair Retinal Breaks, Laser - OS - Left Eye       LASER PROCEDURE NOTE  Procedure:  Barrier laser retinopexy using slit lamp laser, LEFT eye   Diagnosis:   Lattice degeneration w/ atrophic holes, LEFT eye                     Patches of lattice: 1130-0200 with atrophic hole at 0200, 0330-0700 with atrophic hole at 0500  Surgeon: Rennis Chris, MD, PhD  Anesthesia: Topical  Informed consent obtained, operative eye marked, and time out performed prior to initiation of laser.   Laser settings:  Lumenis Smart532 laser, slit lamp Lens: Mainster PRP 165 Power: 280 mW Spot size: 200 microns Duration: 30 msec  # spots: 1448  Placement of laser: Using a Mainster PRP 165 contact lens at the slit lamp, laser was placed in three confluent rows around patches of lattice w/ atrophic holes from 1130-0200 and 0330-0700 anterior to equator.  Complications:  None.  Patient tolerated the procedure well and received written and verbal post-procedure care information/education.            ASSESSMENT/PLAN:    ICD-10-CM   1. Bilateral retinal lattice degeneration  H35.413 prednisoLONE acetate (PRED FORTE) 1 % ophthalmic suspension  Repair Retinal Breaks, Laser - OS - Left Eye    DISCONTINUED: prednisoLONE acetate (PRED FORTE) 1 % ophthalmic suspension    2. Retinal hole of both eyes  H33.323 Repair Retinal Breaks, Laser - OS - Left Eye    3. Retinal edema  H35.81     4. Diabetes mellitus type 2 without retinopathy (HCC)  E11.9      1-3. Lattice degeneration w/ atrophic holes, both eyes - OD: pigmented lattice at 0430, multiple patches of pigmented lattice w/ atrophic holes ST and IT quad - OS: pigmented lattice superiorly from 1130-0200 with atrophic hole at 0200, pigmented lattice at 0330-0700 with atrophic hole at 0500 - discussed findings, prognosis, and treatment options - recommend laser retinopexy OU, OS first today, 12.8.22 - RBA of procedure discussed, questions answered - informed consent obtained and signed - see procedure note  - start PF QID OS x7 days - will clear pt for LASIK once retinopexy completed and stable OU - f/u next Thursday for laser retinopexy OD  4. Diabetes mellitus, type 2 without retinopathy - The incidence, risk factors for progression, natural history and treatment options for diabetic retinopathy  were discussed with patient.   - The need for close monitoring of blood glucose, blood pressure, and serum lipids, avoiding cigarette or any type of tobacco, and the need for long term follow up was also discussed with patient. - f/u in 1 year, sooner prn  5. High myopia  - planning for LASIK w/ Dr. Guido Sander  - will clear pt for LASIK once retinopexy completed and stable OU  Ophthalmic Meds Ordered this visit:  Meds ordered this encounter  Medications   DISCONTD: prednisoLONE acetate (PRED FORTE) 1  % ophthalmic suspension    Sig: Place 1 drop into the left eye 4 (four) times daily.    Dispense:  10 mL    Refill:  0   prednisoLONE acetate (PRED FORTE) 1 % ophthalmic suspension    Sig: Place 1 drop into the left eye 4 (four) times daily.    Dispense:  10 mL    Refill:  0     Return in about 1 week (around 02/07/2021) for Laser retinopexy OD.  There are no Patient Instructions on file for this visit.   Explained the diagnoses, plan, and follow up with the patient and they expressed understanding.  Patient expressed understanding of the importance of proper follow up care.   This document serves as a record of services personally performed by Karie Chimera, MD, PhD. It was created on their behalf by Annalee Genta, COMT. The creation of this record is the provider's dictation and/or activities during the visit.  Electronically signed by: Annalee Genta, COMT 01/31/21 11:17 AM  This document serves as a record of services personally performed by Karie Chimera, MD, PhD. It was created on their behalf by Glee Arvin. Manson Passey, OA an ophthalmic technician. The creation of this record is the provider's dictation and/or activities during the visit.    Electronically signed by: Glee Arvin. Manson Passey, [place] 12.05.2022 11:17 AM   Karie Chimera, M.D., Ph.D. Diseases & Surgery of the Retina and Vitreous [facility]  I have reviewed the above documentation for accuracy and completeness, and I agree with the above. Karie Chimera, M.D., Ph.D. 01/31/21 11:17 AM   Abbreviations: M myopia (nearsighted); A astigmatism; H hyperopia (farsighted); P presbyopia; Mrx spectacle prescription;  CTL contact lenses; OD right eye; OS left eye; OU  both eyes  XT exotropia; ET esotropia; PEK punctate epithelial keratitis; PEE punctate epithelial erosions; DES dry eye syndrome; MGD meibomian gland dysfunction; ATs artificial tears; PFAT's preservative free artificial tears; NSC nuclear sclerotic  cataract; PSC posterior subcapsular cataract; ERM epi-retinal membrane; PVD posterior vitreous detachment; RD retinal detachment; DM diabetes mellitus; DR diabetic retinopathy; NPDR non-proliferative diabetic retinopathy; PDR proliferative diabetic retinopathy; CSME clinically significant macular edema; DME diabetic macular edema; dbh dot blot hemorrhages; CWS cotton wool spot; POAG primary open angle glaucoma; C/D cup-to-disc ratio; HVF humphrey visual field; GVF goldmann visual field; OCT optical coherence tomography; IOP intraocular pressure; BRVO Branch retinal vein occlusion; CRVO central retinal vein occlusion; CRAO central retinal artery occlusion; BRAO branch retinal artery occlusion; RT retinal tear; SB scleral buckle; PPV pars plana vitrectomy; VH Vitreous hemorrhage; PRP panretinal laser photocoagulation; IVK intravitreal kenalog; VMT vitreomacular traction; MH Macular hole;  NVD neovascularization of the disc; NVE neovascularization elsewhere; AREDS age related eye disease study; ARMD age related macular degeneration; POAG primary open angle glaucoma; EBMD epithelial/anterior basement membrane dystrophy; ACIOL anterior chamber intraocular lens; IOL intraocular lens; PCIOL posterior chamber intraocular lens; Phaco/IOL phacoemulsification with intraocular lens placement; PRK photorefractive keratectomy; LASIK laser assisted in situ keratomileusis; HTN hypertension; DM diabetes mellitus; COPD chronic obstructive pulmonary disease

## 2021-02-05 NOTE — Progress Notes (Addendum)
[facility] - Clinic Note  02/07/2021     CHIEF COMPLAINT Patient presents for Retina Follow Up   HISTORY OF PRESENT ILLNESS: Devin Humphrey is a 50 y.o. male who presents to the clinic today for:   HPI     Retina Follow Up   Patient presents with  Other.  In both eyes.  Severity is moderate.  Duration of 1 week.  Since onset it is stable.  I, the attending physician,  performed the HPI with the patient and updated documentation appropriately.        Comments   Pt here for 1 wk f/u s/p retinopexy OS. Pt here for retinopexy OD today. No changes or issues w/ vision.       Last edited by Rennis Chris, MD on 02/07/2021 10:16 AM.     Pt here for laser retinopexy OD  Referring physician: Dr. Graciella Freer at [facility]  HISTORICAL INFORMATION:   Selected notes from the MEDICAL RECORD NUMBER Referred by Dr. Graciella Freer for retina clearance for LASIK, eval lattice degen OU LEE:  Ocular Hx- PMH-    CURRENT MEDICATIONS: Current Outpatient Medications (Ophthalmic Drugs)  Medication Sig   prednisoLONE acetate (PRED FORTE) 1 % ophthalmic suspension Place 1 drop into the left eye 4 (four) times daily.   No current facility-administered medications for this visit. (Ophthalmic Drugs)   Current Outpatient Medications (Other)  Medication Sig   Ascorbic Acid (VITAMIN C) 100 MG tablet Take 100 mg by mouth daily.   aspirin EC 81 MG tablet Take 81 mg by mouth daily. Swallow whole.   cholecalciferol (VITAMIN D3) 25 MCG (1000 UT) tablet Take 1,000 Units by mouth daily.   Multiple Vitamins-Minerals (MULTIVITAMIN WITH MINERALS) tablet Take 1 tablet by mouth daily.   thiamine (VITAMIN B-1) 50 MG tablet Take 50 mg by mouth daily.   tirzepatide [facility]) 2.5 MG/0.5ML Pen Inject 2.5 mg into the skin once a week.   promethazine (PHENERGAN) 25 MG tablet Take 1 tablet (25 mg total) by mouth every 6 (six) hours as needed. (Patient not taking: Reported on 01/28/2021)   zinc  gluconate 50 MG tablet Take 50 mg by mouth daily. (Patient not taking: Reported on 01/28/2021)   No current facility-administered medications for this visit. (Other)   REVIEW OF SYSTEMS: ROS   Positive for: Musculoskeletal, Eyes Negative for: Constitutional, Gastrointestinal, Neurological, Skin, Genitourinary, HENT, Endocrine, Cardiovascular, Respiratory, Psychiatric, Allergic/Imm, Heme/Lymph Last edited by Thompson Grayer, COT on 02/07/2021  9:10 AM.     ALLERGIES No Known Allergies  PAST MEDICAL HISTORY Past Medical History:  Diagnosis Date   Acute respiratory failure with hypoxia (HCC)    CONTACT DERMATITIS&OTHER ECZEMA DUE TO PLANTS 08/19/2009   Qualifier: Diagnosis of  By: Thurmond Butts MD, [place]     Degenerative disc disease    cervical disc disease   GERD (gastroesophageal reflux disease)    Past Surgical History:  Procedure Laterality Date   COLONOSCOPY     UPPER GI ENDOSCOPY     FAMILY HISTORY Family History  Problem Relation Age of Onset   Hyperlipidemia Father    Heart disease Father        s/p CABG and stent   Stroke Father    Hypertension Father    Colon cancer Neg Hx    Prostate cancer Neg Hx     SOCIAL HISTORY Social History   Tobacco Use   Smoking status: Never   Smokeless tobacco: Never  Substance Use Topics  Alcohol use: Yes    Alcohol/week: 0.0 standard drinks    Comment: occasional   Drug use: No       OPHTHALMIC EXAM:  Base Eye Exam     Visual Acuity (Snellen - Linear)       Right Left   Dist cc 20/20 -1 20/20 -1    Correction: Glasses         Tonometry (Tonopen, 9:15 AM)       Right Left   Pressure 13 def         Pupils       Dark Light Shape React APD   Right 3 2 Round Brisk None   Left 3 2 Round Brisk None         Visual Fields (Counting fingers)       Left Right    Full Full         Extraocular Movement       Right Left    Full, Ortho Full, Ortho         Neuro/Psych     Oriented x3: Yes    Mood/Affect: Normal         Dilation     Right eye: 1.0% Mydriacyl, 2.5% Phenylephrine @ 9:15 AM           Slit Lamp and Fundus Exam     Slit Lamp Exam       Right Left   Lids/Lashes Normal Normal   Conjunctiva/Sclera Mild nasal pingeucula Mild nasal pingeucula   Cornea 1+ Punctate epithelial erosions, tear film debris 1+ Punctate epithelial erosions, tear film debris   Anterior Chamber Deep and quiet Deep and quiet   Iris Round and dilated Round and dilated   Lens Clear Clear   Anterior Vitreous Vitreous syneresis, Posterior vitreous detachment, vitreous condensations Vitreous syneresis, vitreous condensations         Fundus Exam       Right Left   Disc Tilted disc, Pink and Sharp, mild PPP superiorly Pink and Sharp, Tilted cup, mild PPA   C/D Ratio 0.2 0.2   Macula Flat, Good foveal reflex, RPE mottling Flat, Good foveal reflex, RPE mottling, No heme or edema   Vessels Trace Copper wiring Trace Copper wiring   Periphery Attached, pigmented lattice at 0430, mild pavingstone inferiorly, multiple patches of pigmented lattice w/ atrophic holes temporal and superior quads from 0600-0130 Attached, pigmented lattice superiorly from 1130-0200 with atrophic hole at 0200, pigmented lattice at 0330-0700 with atrophic hole at 0500            IMAGING AND PROCEDURES  Imaging and Procedures for 02/07/2021  Repair Retinal Breaks, Laser - OD - Right Eye       LASER PROCEDURE NOTE  Procedure:  Barrier laser retinopexy using slit lamp laser, RIGHT eye   Diagnosis:   Lattice degeneration w/ atrophic holes, RIGHT eye                     Patches of lattice: 0600-0130 temporal and superior quads; 0430 inferiorly  Surgeon: Rennis Chris, MD, PhD  Anesthesia: Topical + subconjunctival block  Informed consent obtained, operative eye marked, and time out performed prior to initiation of laser.   Laser settings:  Lumenis Smart532 laser, slit lamp Lens: Mainster PRP 165 Power:  280 mW Spot size: 200 microns Duration: 30 msec  # spots: 1706  Placement of laser: Using a Mainster PRP 165 contact lens at the slit lamp, laser was placed in  three confluent rows around patches of lattice w/ atrophic holes at 0430 and from 0600 to 0130.  Complications: None.  Patient tolerated the procedure well and received written and verbal post-procedure care information/education.             ASSESSMENT/PLAN:    ICD-10-CM   1. Bilateral retinal lattice degeneration  H35.413 Repair Retinal Breaks, Laser - OD - Right Eye    2. Retinal hole of both eyes  H33.323 Repair Retinal Breaks, Laser - OD - Right Eye    3. Diabetes mellitus type 2 without retinopathy (HCC)  E11.9     4. Severe myopia of both eyes  H52.13      1,2. Lattice degeneration w/ atrophic holes, both eyes - OD: pigmented lattice at 0430, multiple patches of pigmented lattice w/ atrophic holes ST and IT quad - OS: pigmented lattice superiorly from 1130-0200 with atrophic hole at 0200, pigmented lattice at 0330-0700 with atrophic hole at 0500 - s/p laser retinopexy OS (12.08.22) - recommend laser retinopexy OD today, 12.15.22 - RBA of procedure discussed, questions answered - informed consent obtained and signed - see procedure note  - start PF QID OD x7 days - will clear pt for LASIK once retinopexy completed and stable OU - f/u 3 wks  3. Diabetes mellitus, type 2 without retinopathy - The incidence, risk factors for progression, natural history and treatment options for diabetic retinopathy  were discussed with patient.   - The need for close monitoring of blood glucose, blood pressure, and serum lipids, avoiding cigarette or any type of tobacco, and the need for long term follow up was also discussed with patient. - f/u in 1 year, sooner prn  4. High myopia  - planning for LASIK w/ Dr. Guido Sander  - will clear pt for LASIK once retinopexy completed and stable OU  Ophthalmic Meds Ordered this  visit:  No orders of the defined types were placed in this encounter.    Return in about 3 weeks (around 02/28/2021) for Dilated Exam, OCT.  There are no Patient Instructions on file for this visit.   Explained the diagnoses, plan, and follow up with the patient and they expressed understanding.  Patient expressed understanding of the importance of proper follow up care.   This document serves as a record of services personally performed by Karie Chimera, MD, PhD. It was created on their behalf by Glee Arvin. Manson Passey, OA an ophthalmic technician. The creation of this record is the provider's dictation and/or activities during the visit.    Electronically signed by: Glee Arvin. Manson Passey, [place] 12.13.2022 10:24 AM  Karie Chimera, M.D., Ph.D. Diseases & Surgery of the Retina and Vitreous [facility]  I have reviewed the above documentation for accuracy and completeness, and I agree with the above. Karie Chimera, M.D., Ph.D. 02/07/21 10:24 AM   Abbreviations: M myopia (nearsighted); A astigmatism; H hyperopia (farsighted); P presbyopia; Mrx spectacle prescription;  CTL contact lenses; OD right eye; OS left eye; OU both eyes  XT exotropia; ET esotropia; PEK punctate epithelial keratitis; PEE punctate epithelial erosions; DES dry eye syndrome; MGD meibomian gland dysfunction; ATs artificial tears; PFAT's preservative free artificial tears; NSC nuclear sclerotic cataract; PSC posterior subcapsular cataract; ERM epi-retinal membrane; PVD posterior vitreous detachment; RD retinal detachment; DM diabetes mellitus; DR diabetic retinopathy; NPDR non-proliferative diabetic retinopathy; PDR proliferative diabetic retinopathy; CSME clinically significant macular edema; DME diabetic macular edema; dbh dot blot hemorrhages; CWS cotton wool spot; POAG  primary open angle glaucoma; C/D cup-to-disc ratio; HVF humphrey visual field; GVF goldmann visual field; OCT optical coherence tomography; IOP  intraocular pressure; BRVO Branch retinal vein occlusion; CRVO central retinal vein occlusion; CRAO central retinal artery occlusion; BRAO branch retinal artery occlusion; RT retinal tear; SB scleral buckle; PPV pars plana vitrectomy; VH Vitreous hemorrhage; PRP panretinal laser photocoagulation; IVK intravitreal kenalog; VMT vitreomacular traction; MH Macular hole;  NVD neovascularization of the disc; NVE neovascularization elsewhere; AREDS age related eye disease study; ARMD age related macular degeneration; POAG primary open angle glaucoma; EBMD epithelial/anterior basement membrane dystrophy; ACIOL anterior chamber intraocular lens; IOL intraocular lens; PCIOL posterior chamber intraocular lens; Phaco/IOL phacoemulsification with intraocular lens placement; PRK photorefractive keratectomy; LASIK laser assisted in situ keratomileusis; HTN hypertension; DM diabetes mellitus; COPD chronic obstructive pulmonary disease

## 2021-02-07 ENCOUNTER — Ambulatory Visit (INDEPENDENT_AMBULATORY_CARE_PROVIDER_SITE_OTHER): Payer: Self-pay | Admitting: Ophthalmology

## 2021-02-07 ENCOUNTER — Encounter (INDEPENDENT_AMBULATORY_CARE_PROVIDER_SITE_OTHER): Payer: Self-pay | Admitting: Ophthalmology

## 2021-02-07 ENCOUNTER — Other Ambulatory Visit: Payer: Self-pay

## 2021-02-07 DIAGNOSIS — E119 Type 2 diabetes mellitus without complications: Secondary | ICD-10-CM

## 2021-02-07 DIAGNOSIS — H35413 Lattice degeneration of retina, bilateral: Secondary | ICD-10-CM

## 2021-02-07 DIAGNOSIS — H3581 Retinal edema: Secondary | ICD-10-CM

## 2021-02-07 DIAGNOSIS — H5213 Myopia, bilateral: Secondary | ICD-10-CM

## 2021-02-07 DIAGNOSIS — H33323 Round hole, bilateral: Secondary | ICD-10-CM

## 2021-02-14 ENCOUNTER — Other Ambulatory Visit: Payer: Self-pay

## 2021-02-14 ENCOUNTER — Ambulatory Visit (INDEPENDENT_AMBULATORY_CARE_PROVIDER_SITE_OTHER): Payer: No Typology Code available for payment source | Admitting: Internal Medicine

## 2021-02-14 VITALS — BP 110/72 | HR 88 | Temp 98.0°F | Resp 16 | Ht 68.0 in | Wt 206.0 lb

## 2021-02-14 DIAGNOSIS — Z125 Encounter for screening for malignant neoplasm of prostate: Secondary | ICD-10-CM

## 2021-02-14 DIAGNOSIS — Z0000 Encounter for general adult medical examination without abnormal findings: Secondary | ICD-10-CM | POA: Diagnosis not present

## 2021-02-14 DIAGNOSIS — Z713 Dietary counseling and surveillance: Secondary | ICD-10-CM | POA: Diagnosis not present

## 2021-02-14 DIAGNOSIS — E7800 Pure hypercholesterolemia, unspecified: Secondary | ICD-10-CM

## 2021-02-14 DIAGNOSIS — F419 Anxiety disorder, unspecified: Secondary | ICD-10-CM

## 2021-02-14 LAB — CBC WITH DIFFERENTIAL/PLATELET
Basophils Absolute: 0 10*3/uL (ref 0.0–0.1)
Basophils Relative: 0.3 % (ref 0.0–3.0)
Eosinophils Absolute: 0.1 10*3/uL (ref 0.0–0.7)
Eosinophils Relative: 1.2 % (ref 0.0–5.0)
HCT: 45 % (ref 39.0–52.0)
Hemoglobin: 15.2 g/dL (ref 13.0–17.0)
Lymphocytes Relative: 36.6 % (ref 12.0–46.0)
Lymphs Abs: 2.5 10*3/uL (ref 0.7–4.0)
MCHC: 33.9 g/dL (ref 30.0–36.0)
MCV: 85.8 fl (ref 78.0–100.0)
Monocytes Absolute: 0.5 10*3/uL (ref 0.1–1.0)
Monocytes Relative: 7.3 % (ref 3.0–12.0)
Neutro Abs: 3.7 10*3/uL (ref 1.4–7.7)
Neutrophils Relative %: 54.6 % (ref 43.0–77.0)
Platelets: 303 10*3/uL (ref 150.0–400.0)
RBC: 5.24 Mil/uL (ref 4.22–5.81)
RDW: 14 % (ref 11.5–15.5)
WBC: 6.8 10*3/uL (ref 4.0–10.5)

## 2021-02-14 LAB — PSA: PSA: 0.98 ng/mL (ref 0.10–4.00)

## 2021-02-14 LAB — COMPREHENSIVE METABOLIC PANEL
ALT: 19 U/L (ref 0–53)
AST: 19 U/L (ref 0–37)
Albumin: 4.4 g/dL (ref 3.5–5.2)
Alkaline Phosphatase: 79 U/L (ref 39–117)
BUN: 16 mg/dL (ref 6–23)
CO2: 28 mEq/L (ref 19–32)
Calcium: 9 mg/dL (ref 8.4–10.5)
Chloride: 102 mEq/L (ref 96–112)
Creatinine, Ser: 1 mg/dL (ref 0.40–1.50)
GFR: 88.6 mL/min (ref >60.00)
Glucose, Bld: 83 mg/dL (ref 70–99)
Potassium: 4.1 mEq/L (ref 3.5–5.1)
Sodium: 138 mEq/L (ref 135–145)
Total Bilirubin: 0.4 mg/dL (ref 0.2–1.2)
Total Protein: 6.8 g/dL (ref 6.0–8.3)

## 2021-02-14 LAB — LIPID PANEL
Cholesterol: 193 mg/dL (ref 0–200)
HDL: 33 mg/dL — ABNORMAL LOW (ref >39.00)
LDL Cholesterol: 125 mg/dL — ABNORMAL HIGH (ref 0–99)
NonHDL: 160.21
Total CHOL/HDL Ratio: 6
Triglycerides: 176 mg/dL — ABNORMAL HIGH (ref 0.0–149.0)
VLDL: 35.2 mg/dL (ref 0.0–40.0)

## 2021-02-14 LAB — TSH: TSH: 1.64 u[IU]/mL (ref 0.35–5.50)

## 2021-02-14 NOTE — Progress Notes (Signed)
Patient ID: Devin Humphrey, male   DOB: 08/07/1970, 50 y.o.   MRN: [id number]   Subjective:    Patient ID: Devin Humphrey, male    DOB: [birth year], 50 y.o.   MRN: [id number]  This visit occurred during the SARS-CoV-2 public health emergency.  Safety protocols were in place, including screening questions prior to the visit, additional usage of staff PPE, and extensive cleaning of exam room while observing appropriate contact time as indicated for disinfecting solutions.   Patient here for his physical exam.   Chief Complaint  Patient presents with   Annual Exam   .   HPI Doing well.  Feels good.  Has been on mounjaro.  Has done well with this.  Was evaluated in ER 12/30/20 for nausea and vomiting.  Concern raised about possible side effect of mounjaro.  Symptoms resolved and he has since restarted mounjaro 2.5mg  and is doing well.  No nausea or vomiting.  No abdominal pain.  Bowels moving.  No chest pain or sob.  No increased cough or congestion.  Working.     Past Medical History:  Diagnosis Date   Acute respiratory failure with hypoxia ([place])    CONTACT DERMATITIS&OTHER ECZEMA DUE TO PLANTS 08/19/2009   Qualifier: Diagnosis of  By: Alveta Heimlich MD, [place]     Degenerative disc disease    cervical disc disease   GERD (gastroesophageal reflux disease)    Past Surgical History:  Procedure Laterality Date   COLONOSCOPY     UPPER GI ENDOSCOPY     Family History  Problem Relation Age of Onset   Hyperlipidemia Father    Heart disease Father        s/p CABG and stent   Stroke Father    Hypertension Father    Colon cancer Neg Hx    Prostate cancer Neg Hx    Social History   Socioeconomic History   Marital status: Married    Spouse name: Deontea Shill   Number of children: Not on file   Years of education: Not on file   Highest education level: Not on file  Occupational History   Not on file  Tobacco Use   Smoking status: Never   Smokeless tobacco: Never  Vaping Use   Vaping  Use: Not on file  Substance and Sexual Activity   Alcohol use: Yes    Alcohol/week: 0.0 standard drinks    Comment: occasional   Drug use: No   Sexual activity: Yes    Partners: Female  Other Topics Concern   Not on file  Social History Narrative   Not on file   Social Determinants of Health   Financial Resource Strain: Low Risk    Difficulty of Paying Living Expenses: Not hard at all  Food Insecurity: Not on file  Transportation Needs: Not on file  Physical Activity: Not on file  Stress: Not on file  Social Connections: Not on file     Review of Systems  Constitutional:  Negative for appetite change and unexpected weight change.  HENT:  Negative for congestion, sinus pressure and sore throat.   Eyes:  Negative for pain and visual disturbance.  Respiratory:  Negative for cough, chest tightness and shortness of breath.   Cardiovascular:  Negative for chest pain, palpitations and leg swelling.  Gastrointestinal:  Negative for abdominal pain, diarrhea, nausea and vomiting.  Genitourinary:  Negative for difficulty urinating and dysuria.  Musculoskeletal:  Negative for joint swelling and myalgias.  Skin:  Negative for  color change and rash.  Neurological:  Negative for dizziness, light-headedness and headaches.  Hematological:  Negative for adenopathy. Does not bruise/bleed easily.  Psychiatric/Behavioral:  Negative for agitation and dysphoric mood.       Objective:     BP 110/72    Pulse 88    Temp 98 F (36.7 C)    Resp 16    Ht 5\' 8"  (1.727 m)    Wt 206 lb (93.4 kg)    SpO2 98%    BMI 31.32 kg/m  Wt Readings from Last 3 Encounters:  02/14/21 206 lb (93.4 kg)  12/30/20 193 lb (87.5 kg)  11/28/20 200 lb (90.7 kg)    Physical Exam Constitutional:      General: He is not in acute distress.    Appearance: Normal appearance. He is well-developed.  HENT:     Head: Normocephalic and atraumatic.     Right Ear: External ear normal.     Left Ear: External ear normal.   Eyes:     General: No scleral icterus.       Right eye: No discharge.        Left eye: No discharge.     Conjunctiva/sclera: Conjunctivae normal.  Neck:     Thyroid: No thyromegaly.  Cardiovascular:     Rate and Rhythm: Normal rate and regular rhythm.  Pulmonary:     Effort: No respiratory distress.     Breath sounds: Normal breath sounds. No wheezing.  Abdominal:     General: Bowel sounds are normal.     Palpations: Abdomen is soft.     Tenderness: There is no abdominal tenderness.  Musculoskeletal:        General: No swelling or tenderness.     Cervical back: Neck supple. No tenderness.  Lymphadenopathy:     Cervical: No cervical adenopathy.  Skin:    Findings: No erythema or rash.  Neurological:     Mental Status: He is alert and oriented to person, place, and time.  Psychiatric:        Mood and Affect: Mood normal.        Behavior: Behavior normal.     Outpatient Encounter Medications as of 02/14/2021  Medication Sig   thiamine 50 MG tablet Take 50 mg by mouth daily.   Multiple Vitamins-Minerals (MULTIVITAMIN WITH MINERALS) tablet Take 1 tablet by mouth daily.   prednisoLONE acetate (PRED FORTE) 1 % ophthalmic suspension Place 1 drop into the left eye 4 (four) times daily.   tirzepatide [facility]) 2.5 MG/0.5ML Pen Inject 2.5 mg into the skin once a week.   [DISCONTINUED] Ascorbic Acid (VITAMIN C) 100 MG tablet Take 100 mg by mouth daily.   [DISCONTINUED] aspirin EC 81 MG tablet Take 81 mg by mouth daily. Swallow whole.   [DISCONTINUED] cholecalciferol (VITAMIN D3) 25 MCG (1000 UT) tablet Take 1,000 Units by mouth daily.   [DISCONTINUED] promethazine (PHENERGAN) 25 MG tablet Take 1 tablet (25 mg total) by mouth every 6 (six) hours as needed. (Patient not taking: Reported on 01/28/2021)   [DISCONTINUED] thiamine (VITAMIN B-1) 50 MG tablet Take 50 mg by mouth daily.   [DISCONTINUED] zinc gluconate 50 MG tablet Take 50 mg by mouth daily. (Patient not taking: Reported on  01/28/2021)   No facility-administered encounter medications on file as of 02/14/2021.     Lab Results  Component Value Date   WBC 6.8 02/14/2021   HGB 15.2 02/14/2021   HCT 45.0 02/14/2021   PLT 303.0 02/14/2021  GLUCOSE 83 02/14/2021   CHOL 193 02/14/2021   TRIG 176.0 (H) 02/14/2021   HDL 33.00 (L) 02/14/2021   LDLDIRECT 156.0 03/30/2019   LDLCALC 125 (H) 02/14/2021   ALT 19 02/14/2021   AST 19 02/14/2021   NA 138 02/14/2021   K 4.1 02/14/2021   CL 102 02/14/2021   CREATININE 1.00 02/14/2021   BUN 16 02/14/2021   CO2 28 02/14/2021   TSH 1.64 02/14/2021   PSA 0.98 02/14/2021    DG Abdomen 1 View  Result Date: 12/30/2020 CLINICAL DATA:  Evaluation for obstruction. EXAM: ABDOMEN - 1 VIEW COMPARISON:  None. FINDINGS: The bowel gas pattern is normal. No radio-opaque calculi or other significant radiographic abnormality are seen. IMPRESSION: Negative. Electronically Signed   By: Fidela Salisbury M.D.   On: 12/30/2020 13:33       Assessment & Plan:   Problem List Items Addressed This Visit     Anxiety    Overall appears to be handling things well.  Follow.       Health care maintenance    Physical today 02/14/21.  Check psa.  Check on colonoscopy.        Hypercholesterolemia    Low cholesterol diet and exercise.  Follow lipid panel.       Relevant Orders   CBC with Differential/Platelet (Completed)   Comprehensive metabolic panel (Completed)   Lipid panel (Completed)   TSH (Completed)   Weight loss counseling, encounter for    On mounjaro.  Stopped secondary to episode of vomiting, etc - as outlined.  GI symptoms resolved.  Back on mounjaro and tolerating.  Continue low carb diet and exercise. Follow.        Other Visit Diagnoses     Routine general medical examination at a health care facility    -  Primary   Prostate cancer screening       Relevant Orders   PSA (Completed)        Einar Pheasant, MD

## 2021-02-23 ENCOUNTER — Encounter: Payer: Self-pay | Admitting: Internal Medicine

## 2021-02-23 NOTE — Assessment & Plan Note (Signed)
On mounjaro.  Stopped secondary to episode of vomiting, etc - as outlined.  GI symptoms resolved.  Back on mounjaro and tolerating.  Continue low carb diet and exercise. Follow.

## 2021-02-23 NOTE — Assessment & Plan Note (Signed)
Overall appears to be handling things well.  Follow.  ?

## 2021-02-23 NOTE — Assessment & Plan Note (Signed)
Physical today 02/14/21.  Check psa.  Check on colonoscopy.

## 2021-02-23 NOTE — Assessment & Plan Note (Signed)
Low cholesterol diet and exercise.  Follow lipid panel.   

## 2021-02-25 NOTE — Progress Notes (Signed)
[facility] - Clinic Note  02/28/2021     CHIEF COMPLAINT Patient presents for Retina Follow Up    HISTORY OF PRESENT ILLNESS: Devin Humphrey is a 51 y.o. male who presents to the clinic today for:   HPI     Retina Follow Up   Patient presents with  Retinal Break/Detachment.  In both eyes.  This started weeks ago.  Severity is mild.  Duration of 3 weeks.  Since onset it is stable.  I, the attending physician,  performed the HPI with the patient and updated documentation appropriately.        Comments   51 y/o male pt here for 3 wk f/u for lattice w/atrophic holes OU.  S/p laser retinopexy OS 12.8.22 and s/p laser retinopexy OD 12.15.22.  No change in [place] OU.  Denies pain, FOL, floaters.  No gtts.      Last edited by Rennis Chris, MD on 02/28/2021 12:41 PM.    Pt states no problems after laser procedures, he feels like right eye took longer to heal  Referring physician: Dr. Graciella Freer at [facility]  HISTORICAL INFORMATION:   Selected notes from the MEDICAL RECORD NUMBER Referred by Dr. Graciella Freer for retina clearance for LASIK, eval lattice degen OU LEE:  Ocular Hx- PMH-    CURRENT MEDICATIONS: Current Outpatient Medications (Ophthalmic Drugs)  Medication Sig   prednisoLONE acetate (PRED FORTE) 1 % ophthalmic suspension Place 1 drop into the left eye 4 (four) times daily.   No current facility-administered medications for this visit. (Ophthalmic Drugs)   Current Outpatient Medications (Other)  Medication Sig   Multiple Vitamins-Minerals (MULTIVITAMIN WITH MINERALS) tablet Take 1 tablet by mouth daily.   thiamine 50 MG tablet Take 50 mg by mouth daily.   tirzepatide [facility]) 2.5 MG/0.5ML Pen Inject 2.5 mg into the skin once a week.   No current facility-administered medications for this visit. (Other)   REVIEW OF SYSTEMS: ROS   Positive for: Gastrointestinal, Neurological, Musculoskeletal, Endocrine, Eyes Negative for: Constitutional, Skin,  Genitourinary, HENT, Cardiovascular, Respiratory, Psychiatric, Allergic/Imm, Heme/Lymph Last edited by Celine Mans, COA on 02/28/2021  8:34 AM.      ALLERGIES No Known Allergies  PAST MEDICAL HISTORY Past Medical History:  Diagnosis Date   Acute respiratory failure with hypoxia ([facility])    CONTACT DERMATITIS&OTHER ECZEMA DUE TO PLANTS 08/19/2009   Qualifier: Diagnosis of  By: Thurmond Butts MD, [place]     Degenerative disc disease    cervical disc disease   GERD (gastroesophageal reflux disease)    Past Surgical History:  Procedure Laterality Date   COLONOSCOPY     UPPER GI ENDOSCOPY     FAMILY HISTORY Family History  Problem Relation Age of Onset   Hyperlipidemia Father    Heart disease Father        s/p CABG and stent   Stroke Father    Hypertension Father    Colon cancer Neg Hx    Prostate cancer Neg Hx     SOCIAL HISTORY Social History   Tobacco Use   Smoking status: Never   Smokeless tobacco: Never  Substance Use Topics   Alcohol use: Yes    Alcohol/week: 0.0 standard drinks    Comment: occasional   Drug use: No       OPHTHALMIC EXAM:  Base Eye Exam     Visual Acuity (Snellen - Linear)       Right Left   Dist cc 20/15 -2 20/15 -2  Correction: Glasses         Tonometry (Tonopen, 8:35 AM)       Right Left   Pressure 16 15         Pupils       Dark Light Shape React APD   Right 3 2 Round Brisk None   Left 3 2 Round Brisk None         Visual Fields (Counting fingers)       Left Right    Full Full         Extraocular Movement       Right Left    Full, Ortho Full, Ortho         Neuro/Psych     Oriented x3: Yes   Mood/Affect: Normal         Dilation     Right eye:            Slit Lamp and Fundus Exam     Slit Lamp Exam       Right Left   Lids/Lashes Normal Normal   Conjunctiva/Sclera Mild nasal pingeucula, Subconjunctival hemorrhage Mild nasal pingeucula   Cornea Trace Punctate epithelial erosions, tear film  debris Trace Punctate epithelial erosions, tear film debris   Anterior Chamber Deep and quiet Deep and quiet   Iris Round and dilated Round and dilated   Lens Clear Clear   Anterior Vitreous Vitreous syneresis, Posterior vitreous detachment, vitreous condensations Vitreous syneresis, vitreous condensations         Fundus Exam       Right Left   Disc Tilted disc, Pink and Sharp, mild PPP superiorly Pink and Sharp, Tilted cup, mild PPA   C/D Ratio 0.2 0.2   Macula Flat, Good foveal reflex, RPE mottling Flat, Good foveal reflex, RPE mottling, No heme or edema   Vessels Trace Copper wiring Trace Copper wiring   Periphery Attached, pigmented lattice at 0430, mild [place] inferiorly, multiple patches of pigmented lattice w/ atrophic holes temporal and superior quads from 0600-0130 -- good laser surrounding all lesions, spanning 0430-0130 temporally Attached, pigmented lattice superiorly from 1030-0200 with atrophic hole at 0200, pigmented lattice at 0330-0700 with atrophic holes at 0430 and 0500 -- with good laser surrounding all lesions            IMAGING AND PROCEDURES  Imaging and Procedures for 02/28/2021  OCT, Retina - OU - Both Eyes       Right Eye Quality was good. Central Foveal Thickness: 309. Progression has been stable. Findings include normal foveal contour, no IRF, no SRF, myopic contour (Partial PVD).   Left Eye Quality was good. Central Foveal Thickness: 304. Progression has been stable. Findings include normal foveal contour, no IRF, no SRF, vitreomacular adhesion , myopic contour.   Notes *Images captured and stored on drive  Diagnosis / Impression:  NFP, no IRF/SRF OU  Clinical management:  See below  Abbreviations: NFP - Normal foveal profile. CME - cystoid macular edema. PED - pigment epithelial detachment. IRF - intraretinal fluid. SRF - subretinal fluid. EZ - ellipsoid zone. ERM - epiretinal membrane. ORA - outer retinal atrophy. ORT - outer retinal  tubulation. SRHM - subretinal hyper-reflective material. IRHM - intraretinal hyper-reflective material            ASSESSMENT/PLAN:    ICD-10-CM   1. Bilateral retinal lattice degeneration  H35.413 OCT, Retina - OU - Both Eyes    2. Retinal hole of both eyes  H33.323     3.  Diabetes mellitus type 2 without retinopathy ([place])  E11.9     4. Severe myopia of both eyes  H52.13      1,2. Lattice degeneration w/ atrophic holes, both eyes - OD: pigmented lattice at 0430, multiple patches of pigmented lattice w/ atrophic holes ST and IT quad - OS: pigmented lattice superiorly from 1030-0200 with atrophic hole at 0200, pigmented lattice at 0330-0700 with atrophic holes at 0430 and 0500 - s/p laser retinopexy OS (12.08.22) -- good laser surrounding - s/p laser retinopexy OD (12.15.22) -- good laser surrounding - completed PF QID OU x7 days - no new RT/RD - clear from a retina standpoint to proceed with LASIK when pt and surgeon are ready - f/u 4-6 wks  3. Diabetes mellitus, type 2 without retinopathy - The incidence, risk factors for progression, natural history and treatment options for diabetic retinopathy  were discussed with patient.   - The need for close monitoring of blood glucose, blood pressure, and serum lipids, avoiding cigarette or any type of tobacco, and the need for long term follow up was also discussed with patient. - f/u in 1 year, sooner prn  4. High myopia  - planning for LASIK at [facility]  - clear from a retina standpoint to proceed with LASIK when pt and surgeon are ready  Ophthalmic Meds Ordered this visit:  No orders of the defined types were placed in this encounter.    Return for f/u 4-6 weeks, lattice degeneration OU, DFE, OCT.  There are no Patient Instructions on file for this visit.   Explained the diagnoses, plan, and follow up with the patient and they expressed understanding.  Patient expressed understanding of the importance of proper follow up care.    This document serves as a record of services personally performed by Gardiner Sleeper, MD, PhD. It was created on their behalf by San Jetty. Owens Shark, OA an ophthalmic technician. The creation of this record is the provider's dictation and/or activities during the visit.    Electronically signed by: San Jetty. Marguerita Merles 01.02.2023 12:47 PM  Gardiner Sleeper, M.D., Ph.D. Diseases & Surgery of the Retina and Vitreous [facility]  I have reviewed the above documentation for accuracy and completeness, and I agree with the above. Gardiner Sleeper, M.D., Ph.D. 02/28/21 12:47 PM  Abbreviations: M myopia (nearsighted); A astigmatism; H hyperopia (farsighted); P presbyopia; Mrx spectacle prescription;  CTL contact lenses; OD right eye; OS left eye; OU both eyes  XT exotropia; ET esotropia; PEK punctate epithelial keratitis; PEE punctate epithelial erosions; DES dry eye syndrome; MGD meibomian gland dysfunction; ATs artificial tears; PFAT's preservative free artificial tears; [place] nuclear sclerotic cataract; PSC posterior subcapsular cataract; ERM epi-retinal membrane; PVD posterior vitreous detachment; RD retinal detachment; DM diabetes mellitus; DR diabetic retinopathy; NPDR non-proliferative diabetic retinopathy; PDR proliferative diabetic retinopathy; CSME clinically significant macular edema; DME diabetic macular edema; dbh dot blot hemorrhages; CWS cotton wool spot; POAG primary open angle glaucoma; C/D cup-to-disc ratio; HVF humphrey visual field; GVF goldmann visual field; OCT optical coherence tomography; IOP intraocular pressure; BRVO Branch retinal vein occlusion; CRVO central retinal vein occlusion; CRAO central retinal artery occlusion; BRAO branch retinal artery occlusion; RT retinal tear; SB scleral buckle; PPV pars plana vitrectomy; VH Vitreous hemorrhage; PRP panretinal laser photocoagulation; IVK intravitreal kenalog; VMT vitreomacular traction; MH Macular hole;  NVD  neovascularization of the disc; NVE neovascularization elsewhere; AREDS age related eye disease study; ARMD age related macular degeneration; POAG primary open angle  glaucoma; EBMD epithelial/anterior basement membrane dystrophy; ACIOL anterior chamber intraocular lens; IOL intraocular lens; PCIOL posterior chamber intraocular lens; Phaco/IOL phacoemulsification with intraocular lens placement; [place] photorefractive keratectomy; LASIK laser assisted in situ keratomileusis; HTN hypertension; DM diabetes mellitus; COPD chronic obstructive pulmonary disease

## 2021-02-28 ENCOUNTER — Ambulatory Visit (INDEPENDENT_AMBULATORY_CARE_PROVIDER_SITE_OTHER): Payer: Self-pay | Admitting: Ophthalmology

## 2021-02-28 ENCOUNTER — Encounter (INDEPENDENT_AMBULATORY_CARE_PROVIDER_SITE_OTHER): Payer: Self-pay | Admitting: Ophthalmology

## 2021-02-28 ENCOUNTER — Other Ambulatory Visit: Payer: Self-pay

## 2021-02-28 DIAGNOSIS — H35413 Lattice degeneration of retina, bilateral: Secondary | ICD-10-CM

## 2021-02-28 DIAGNOSIS — E119 Type 2 diabetes mellitus without complications: Secondary | ICD-10-CM

## 2021-02-28 DIAGNOSIS — H5213 Myopia, bilateral: Secondary | ICD-10-CM

## 2021-02-28 DIAGNOSIS — H33323 Round hole, bilateral: Secondary | ICD-10-CM

## 2021-03-04 ENCOUNTER — Encounter: Payer: Self-pay | Admitting: Internal Medicine

## 2021-03-04 ENCOUNTER — Other Ambulatory Visit: Payer: Self-pay | Admitting: Internal Medicine

## 2021-03-04 DIAGNOSIS — Z6833 Body mass index (BMI) 33.0-33.9, adult: Secondary | ICD-10-CM

## 2021-03-04 DIAGNOSIS — E669 Obesity, unspecified: Secondary | ICD-10-CM

## 2021-03-05 NOTE — Telephone Encounter (Signed)
Are you ok with sending in?

## 2021-03-05 NOTE — Telephone Encounter (Signed)
Ok

## 2021-03-27 NOTE — Progress Notes (Signed)
[facility] - Clinic Note  03/28/2021     CHIEF COMPLAINT Patient presents for Retina Follow Up    HISTORY OF PRESENT ILLNESS: CANE Humphrey is a 51 y.o. male who presents to the clinic today for:   HPI     Retina Follow Up   In both eyes.  This started 7 weeks ago.  I, the attending physician,  performed the HPI with the patient and updated documentation appropriately.        Comments   Patient here for 7 weeks retina follow up for lattice deg. OU. Patient states vision doing good. No eye pain.       Last edited by Devin Chris, MD on 03/28/2021  5:14 PM.    Pt states he has not scheduled lasik yet, but he has an appt tomorrow with Dr. Graciella Freer  Referring physician: Dr. Graciella Freer at [facility]  HISTORICAL INFORMATION:   Selected notes from the MEDICAL RECORD NUMBER Referred by Dr. Graciella Freer for retina clearance for LASIK, eval lattice degen OU LEE:  Ocular Hx- PMH-    CURRENT MEDICATIONS: Current Outpatient Medications (Ophthalmic Drugs)  Medication Sig   prednisoLONE acetate (PRED FORTE) 1 % ophthalmic suspension Place 1 drop into the left eye 4 (four) times daily.   No current facility-administered medications for this visit. (Ophthalmic Drugs)   Current Outpatient Medications (Other)  Medication Sig   Multiple Vitamins-Minerals (MULTIVITAMIN WITH MINERALS) tablet Take 1 tablet by mouth daily.   thiamine 50 MG tablet Take 50 mg by mouth daily.   tirzepatide [facility]) 5 MG/0.5ML Pen Inject 5 mg into the skin once a week.   No current facility-administered medications for this visit. (Other)   REVIEW OF SYSTEMS:  ALLERGIES No Known Allergies  PAST MEDICAL HISTORY Past Medical History:  Diagnosis Date   Acute respiratory failure with hypoxia (HCC)    CONTACT DERMATITIS&OTHER ECZEMA DUE TO PLANTS 08/19/2009   Qualifier: Diagnosis of  By: Thurmond Butts MD, [place]     Degenerative disc disease    cervical disc disease   GERD (gastroesophageal  reflux disease)    Past Surgical History:  Procedure Laterality Date   COLONOSCOPY     UPPER GI ENDOSCOPY     FAMILY HISTORY Family History  Problem Relation Age of Onset   Hyperlipidemia Father    Heart disease Father        s/p CABG and stent   Stroke Father    Hypertension Father    Colon cancer Neg Hx    Prostate cancer Neg Hx     SOCIAL HISTORY Social History   Tobacco Use   Smoking status: Never   Smokeless tobacco: Never  Vaping Use   Vaping Use: Never used  Substance Use Topics   Alcohol use: Yes    Alcohol/week: 0.0 standard drinks    Comment: occasional   Drug use: No       OPHTHALMIC EXAM:  Base Eye Exam     Visual Acuity (Snellen - Linear)       Right Left   Dist cc 20/15 -1 20/15 -2    Correction: Glasses         Tonometry (Tonopen, 9:12 AM)       Right Left   Pressure 12 11         Pupils       Dark Light Shape React APD   Right 3 2 Round Brisk None   Left 3 2 Round Brisk None  Visual Fields (Counting fingers)       Left Right    Full Full         Extraocular Movement       Right Left    Full, Ortho Full, Ortho         Neuro/Psych     Oriented x3: Yes   Mood/Affect: Normal         Dilation     Both eyes: 1.0% Mydriacyl, 2.5% Phenylephrine @ 9:12 AM           Slit Lamp and Fundus Exam     Slit Lamp Exam       Right Left   Lids/Lashes Normal Normal   Conjunctiva/Sclera Mild nasal pingeucula, Subconjunctival hemorrhage Mild nasal pingeucula   Cornea Trace Punctate epithelial erosions, tear film debris Trace Punctate epithelial erosions, tear film debris   Anterior Chamber Deep and quiet Deep and quiet   Iris Round and dilated Round and dilated   Lens Clear Clear   Anterior Vitreous Vitreous syneresis, Posterior vitreous detachment, vitreous condensations Vitreous syneresis, vitreous condensations         Fundus Exam       Right Left   Disc Tilted disc, Pink and Sharp, mild PPP  superiorly Pink and Sharp, Tilted cup, mild PPA   C/D Ratio 0.2 0.2   Macula Flat, Good foveal reflex, RPE mottling Flat, Good foveal reflex, RPE mottling, No heme or edema   Vessels Trace Copper wiring Trace Copper wiring   Periphery Attached, pigmented lattice at 0430, mild pavingstone inferiorly, multiple patches of pigmented lattice w/ atrophic holes temporal and superior quads from 0600-0130 -- good laser surrounding all lesions, spanning 0430-0130 temporally Attached, pigmented lattice superiorly from 1030-0200 with atrophic hole at 0200, pigmented lattice at 0330-0700 with atrophic holes at 0430 and 0500 -- with good laser surrounding all lesions, new VR tuft at 0900 -- no laser surrounding           Refraction     Wearing Rx       Sphere Cylinder Axis   Right -9.25 +1.25 125   Left -10.25 +1.50 029            IMAGING AND PROCEDURES  Imaging and Procedures for 03/28/2021  OCT, Retina - OU - Both Eyes       Right Eye Quality was good. Central Foveal Thickness: 313. Progression has been stable. Findings include normal foveal contour, no IRF, no SRF, myopic contour, vitreomacular adhesion .   Left Eye Quality was good. Central Foveal Thickness: 306. Progression has been stable. Findings include normal foveal contour, no IRF, no SRF, vitreomacular adhesion , myopic contour.   Notes *Images captured and stored on drive  Diagnosis / Impression:  NFP, no IRF/SRF OU  Clinical management:  See below  Abbreviations: NFP - Normal foveal profile. CME - cystoid macular edema. PED - pigment epithelial detachment. IRF - intraretinal fluid. SRF - subretinal fluid. EZ - ellipsoid zone. ERM - epiretinal membrane. ORA - outer retinal atrophy. ORT - outer retinal tubulation. SRHM - subretinal hyper-reflective material. IRHM - intraretinal hyper-reflective material     LASER PROCEDURE NOTE   Procedure:     Barrier laser retinopexy using slit lamp laser, LEFT eye    Diagnosis:        VR tuft / retinal defect, LEFT eye, 0900                       Surgeon:  Devin ChrisBrian Jader Desai, MD, PhD   Anesthesia:    Topical   Informed consent obtained, operative eye marked, and time out performed prior to initiation of laser.    Laser settings:  Lumenis Smart532 laser, slit lamp Lens: Mainster PRP 165 Power: 280 mW Spot size: 200 microns Duration: 30 msec  # spots: 70   Placement of laser: Using a Mainster PRP 165 contact lens at the slit lamp, laser was placed in three confluent rows around focal VR tuft at 0900 anterior to equator.   Complications: None.   Patient tolerated the procedure well and received written and verbal post-procedure care information/education.        ASSESSMENT/PLAN:    ICD-10-CM   1. Bilateral retinal lattice degeneration  H35.413 OCT, Retina - OU - Both Eyes    2. Retinal hole of both eyes  H33.323     3. Diabetes mellitus type 2 without retinopathy (HCC)  E11.9     4. Severe myopia of both eyes  H52.13      1,2. Lattice degeneration w/ atrophic holes, both eyes - OD: pigmented lattice at 0430, multiple patches of pigmented lattice w/ atrophic holes ST and IT quad - OS: pigmented lattice superiorly from 1030-0200 with atrophic hole at 0200, pigmented lattice at 0330-0700 with atrophic holes at 0430 and 0500 - s/p laser retinopexy OS (12.08.22) -- good laser surrounding - s/p laser retinopexy OD (12.15.22) -- good laser surrounding - new focal VR tuft  / retinal defect noted at 0900 position OS - recommend laser retinopexy touch up OS today, 02.02.23 - pt wishes to proceed with laser - RBA of procedure discussed, questions answered - informed consent obtained and signed - see procedure note - start Lotemax SM QID OS x7 days - f/u 2-3 weeks, DFE, OCT  3. Diabetes mellitus, type 2 without retinopathy - The incidence, risk factors for progression, natural history and treatment options for diabetic retinopathy  were discussed with  patient.   - The need for close monitoring of blood glucose, blood pressure, and serum lipids, avoiding cigarette or any type of tobacco, and the need for long term follow up was also discussed with patient. - f/u in 1 year, sooner prn  4. High myopia  - [facility]  - clear from a retina standpoint to proceed with LASIK when pt and surgeon are ready  Ophthalmic Meds Ordered this visit:  No orders of the defined types were placed in this encounter.    Return for f/u 2-3 weeks, VR tuft OS, DFE, OCT.  There are no Patient Instructions on file for this visit.   Explained the diagnoses, plan, and follow up with the patient and they expressed understanding.  Patient expressed understanding of the importance of proper follow up care.   This document serves as a record of services personally performed by Karie ChimeraBrian G. Vesna Kable, MD, PhD. It was created on their behalf by Glee ArvinAmanda J. Manson PasseyBrown, OA an ophthalmic technician. The creation of this record is the provider's dictation and/or activities during the visit.    Electronically signed by: Glee ArvinAmanda J. Manson PasseyBrown, [place] 02.01.2023 5:20 PM  This document serves as a record of services personally performed by Karie ChimeraBrian G. Tyrina Hines, MD, PhD. It was created on their behalf by Glee ArvinAmanda J. Manson PasseyBrown, OA an ophthalmic technician. The creation of this record is the provider's dictation and/or activities during the visit.    Electronically signed by: Glee ArvinAmanda J. Manson PasseyBrown, [place] 02.02.2023 5:20 PM  Karie ChimeraBrian G. Rogerio Boutelle, M.D.,  Ph.D. Diseases & Surgery of the Retina and Vitreous [facility]  I have reviewed the above documentation for accuracy and completeness, and I agree with the above. Karie Chimera, M.D., Ph.D. 03/28/21 5:20 PM   Abbreviations: M myopia (nearsighted); A astigmatism; H hyperopia (farsighted); P presbyopia; Mrx spectacle prescription;  CTL contact lenses; OD right eye; OS left eye; OU both eyes  XT exotropia; ET esotropia; PEK punctate  epithelial keratitis; PEE punctate epithelial erosions; DES dry eye syndrome; MGD meibomian gland dysfunction; ATs artificial tears; PFAT's preservative free artificial tears; NSC nuclear sclerotic cataract; PSC posterior subcapsular cataract; ERM epi-retinal membrane; PVD posterior vitreous detachment; RD retinal detachment; DM diabetes mellitus; DR diabetic retinopathy; NPDR non-proliferative diabetic retinopathy; PDR proliferative diabetic retinopathy; CSME clinically significant macular edema; DME diabetic macular edema; dbh dot blot hemorrhages; CWS cotton wool spot; POAG primary open angle glaucoma; C/D cup-to-disc ratio; HVF humphrey visual field; GVF goldmann visual field; OCT optical coherence tomography; IOP intraocular pressure; BRVO Branch retinal vein occlusion; CRVO central retinal vein occlusion; CRAO central retinal artery occlusion; BRAO branch retinal artery occlusion; RT retinal tear; SB scleral buckle; PPV pars plana vitrectomy; VH Vitreous hemorrhage; PRP panretinal laser photocoagulation; IVK intravitreal kenalog; VMT vitreomacular traction; MH Macular hole;  NVD neovascularization of the disc; NVE neovascularization elsewhere; AREDS age related eye disease study; ARMD age related macular degeneration; POAG primary open angle glaucoma; EBMD epithelial/anterior basement membrane dystrophy; ACIOL anterior chamber intraocular lens; IOL intraocular lens; PCIOL posterior chamber intraocular lens; Phaco/IOL phacoemulsification with intraocular lens placement; PRK photorefractive keratectomy; LASIK laser assisted in situ keratomileusis; HTN hypertension; DM diabetes mellitus; COPD chronic obstructive pulmonary disease

## 2021-03-28 ENCOUNTER — Ambulatory Visit (INDEPENDENT_AMBULATORY_CARE_PROVIDER_SITE_OTHER): Payer: Self-pay | Admitting: Ophthalmology

## 2021-03-28 ENCOUNTER — Encounter (INDEPENDENT_AMBULATORY_CARE_PROVIDER_SITE_OTHER): Payer: Self-pay | Admitting: Ophthalmology

## 2021-03-28 ENCOUNTER — Other Ambulatory Visit: Payer: Self-pay

## 2021-03-28 ENCOUNTER — Ambulatory Visit: Payer: PRIVATE HEALTH INSURANCE | Admitting: Pharmacist

## 2021-03-28 VITALS — Wt 198.0 lb

## 2021-03-28 DIAGNOSIS — H33323 Round hole, bilateral: Secondary | ICD-10-CM

## 2021-03-28 DIAGNOSIS — E119 Type 2 diabetes mellitus without complications: Secondary | ICD-10-CM

## 2021-03-28 DIAGNOSIS — Z6833 Body mass index (BMI) 33.0-33.9, adult: Secondary | ICD-10-CM

## 2021-03-28 DIAGNOSIS — E669 Obesity, unspecified: Secondary | ICD-10-CM

## 2021-03-28 DIAGNOSIS — H5213 Myopia, bilateral: Secondary | ICD-10-CM

## 2021-03-28 DIAGNOSIS — H35413 Lattice degeneration of retina, bilateral: Secondary | ICD-10-CM

## 2021-03-28 MED ORDER — MOUNJARO 5 MG/0.5ML ~~LOC~~ SOAJ: 5.0000 mg | SUBCUTANEOUS | 5 refills | Status: DC

## 2021-03-28 NOTE — Chronic Care Management (AMB) (Signed)
Chronic Care Management CCM Pharmacy Note  03/28/2021 Name:  [name] MRN:  [id number] DOB:  29-Sep-1970  Summary: - Tolerating regimen and requests to continue, even though expensive.    Recommendations/Changes made from today's visit: - Continue current regimen at this time  Subjective: Devin Humphrey is an 51 y.o. year old male who is a primary patient of Devin Pheasant, MD.  The CCM team was consulted for assistance with disease management and care coordination needs.    Engaged with patient by telephone for  follow up  for pharmacy case management and/or care coordination services.   Objective:  Medications Reviewed Today     Reviewed by De Hollingshead, RPH-CPP (Pharmacist) on 03/28/21 at 7  Med List Status: <None>   Medication Order Taking? Sig Documenting Provider Last Dose Status Informant  MOUNJARO 5 MG/0.5ML Pen [id number] Yes INJECT FIVE MG (0.5 MLS) UNDER THE SKIN ONCE WEEKLY Devin Pheasant, MD Taking Active   Multiple Vitamins-Minerals (MULTIVITAMIN WITH MINERALS) tablet [id number] Yes Take 1 tablet by mouth daily. [provider] Taking Active   prednisoLONE acetate (PRED FORTE) 1 % ophthalmic suspension [id number] Yes Place 1 drop into the left eye 4 (four) times daily. Devin Caffey, MD Taking Active   thiamine 50 MG tablet [id number] Yes Take 50 mg by mouth daily. [provider] Taking Active             Pertinent Labs:   No results found for: HGBA1C Lab Results  Component Value Date   CHOL 193 02/14/2021   HDL 33.00 (L) 02/14/2021   LDLCALC 125 (H) 02/14/2021   LDLDIRECT 156.0 03/30/2019   TRIG 176.0 (H) 02/14/2021   CHOLHDL 6 02/14/2021   Lab Results  Component Value Date   CREATININE 1.00 02/14/2021   BUN 16 02/14/2021   NA 138 02/14/2021   K 4.1 02/14/2021   CL 102 02/14/2021   CO2 28 02/14/2021    SDOH:  (Social Determinants of Health) assessments and interventions performed:  SDOH Interventions     Flowsheet Row Most Recent Value  SDOH Interventions   Financial Strain Interventions Intervention Not Indicated       CCM Care Plan  Review of patient past medical history, allergies, medications, health status, including review of consultants reports, laboratory and other test data, was performed as part of comprehensive evaluation and provision of chronic care management services.   Care Plan : Medication Monitoring  Updates made by De Hollingshead, RPH-CPP since 03/28/2021 12:00 AM     Problem: Obesity      Long-Range Goal: Disease Progression Prevention   Start Date: 10/09/2020  Recent Progress: On track  Priority: High  Note:   Current Barriers:  Unable to achieve control of weight    Pharmacist Clinical Goal(s):  Over the next 90 days, patient will achieve control of weight as evidenced by continued weight loss  through collaboration with PharmD and provider.    Interventions: 1:1 collaboration with Devin Pheasant, MD regarding development and update of comprehensive plan of care as evidenced by provider attestation and co-signature Inter-disciplinary care team collaboration (see longitudinal plan of care) Comprehensive medication review performed; medication list updated in electronic medical record  Overweight/Obesity Complicated by GERD, chronic pain: Unable to achieve goal weight loss through lifestyle modification alone; current treatment: Mounjaro 5 mg weekly - though notes he is stretching supply of medication due to paying out of pocket for the medication.  Reports an episode of diarrhea last week, but  struggles with period constipation on this medication °Medications/Strategies previously tried: Optavia (lost ~20 lbs) °Baseline weight: 206; most recent weight: 198 lbs °Current meal patterns: really able to notice decrease in appetite °Current exercise: 30 minutes a day on peloton, rowing machines; 3-4 times weekly;  °Patient requests to continue  Mounjaro 5 mg weekly. Advised that there are no other access options with high deductible health plans at this time to access GLP1 therapy for weight loss. He is interesting in continuing to pay out of pocket for the time being. Recommended to continue current regimen at this time ° °Supplementation: °Current treatment: vitamin C, aspirin, vitamin D, zinc °  °  °Patient Goals/Self-Care Activities °Over the next 90 days, patient will:  °- collaborate with provider on medication access solutions °target a minimum of 150 minutes of moderate intensity exercise weekly °engage in dietary modifications by reducing portion sizes, minimizing late night snacking, and avoid fast food restaurants. °  °  ° °Plan: goals of care met. Closing CCM case.  ° °Catie Travis, PharmD, BCACP, CPP °Clinical Pharmacist ° [facility] °[phone] ° ° ° ° ° °

## 2021-04-04 NOTE — Progress Notes (Signed)
[facility] Note  04/12/2021     CHIEF COMPLAINT Patient presents for Post-op Follow-up    HISTORY OF PRESENT ILLNESS: Devin Humphrey is a 51 y.o. male who presents to the clinic today for:   HPI     Post-op Follow-up   In left eye.  Discomfort includes none.  I, the attending physician,  performed the HPI with the patient and updated documentation appropriately.        Comments   2 week S/P Retinopexy os 02(02/23). Patient states no flashes or floaters. Vision is about the same      Last edited by Bernarda Caffey, MD on 04/12/2021  9:48 AM.     Pt is going to see Dr. Pryor Ochoa next week  Referring physician: Dr. Pryor Ochoa at [facility] INFORMATION:   Selected notes from the [facility] Referred by Dr. Pryor Ochoa for retina clearance for LASIK, eval lattice degen OU LEE:  Ocular Hx- PMH-    CURRENT MEDICATIONS: Current Outpatient Medications (Ophthalmic Drugs)  Medication Sig   prednisoLONE acetate (PRED FORTE) 1 % ophthalmic suspension Place 1 drop into the left eye 4 (four) times daily.   No current facility-administered medications for this visit. (Ophthalmic Drugs)   Current Outpatient Medications (Other)  Medication Sig   Multiple Vitamins-Minerals (MULTIVITAMIN WITH MINERALS) tablet Take 1 tablet by mouth daily.   thiamine 50 MG tablet Take 50 mg by mouth daily.   tirzepatide [facility]) 5 MG/0.5ML Pen Inject 5 mg into the skin once a week.   No current facility-administered medications for this visit. (Other)   REVIEW OF SYSTEMS: ROS   Positive for: Gastrointestinal, Neurological, Musculoskeletal, Endocrine, Eyes Negative for: Constitutional, Skin, Genitourinary, HENT, Cardiovascular, Respiratory, Psychiatric, Allergic/Imm, Heme/Lymph Last edited by Elmore Guise, COT on 04/12/2021  9:14 AM.     ALLERGIES No Known Allergies  PAST MEDICAL HISTORY Past Medical History:  Diagnosis Date   Acute  respiratory failure with hypoxia ([place])    CONTACT DERMATITIS&OTHER ECZEMA DUE TO PLANTS 08/19/2009   Qualifier: Diagnosis of  By: Alveta Heimlich MD, [place]     Degenerative disc disease    cervical disc disease   GERD (gastroesophageal reflux disease)    Past Surgical History:  Procedure Laterality Date   COLONOSCOPY     UPPER GI ENDOSCOPY     FAMILY HISTORY Family History  Problem Relation Age of Onset   Hyperlipidemia Father    Heart disease Father        s/p CABG and stent   Stroke Father    Hypertension Father    Colon cancer Neg Hx    Prostate cancer Neg Hx     SOCIAL HISTORY Social History   Tobacco Use   Smoking status: Never   Smokeless tobacco: Never  Vaping Use   Vaping Use: Never used  Substance Use Topics   Alcohol use: Yes    Alcohol/week: 0.0 standard drinks    Comment: occasional   Drug use: No       OPHTHALMIC EXAM:  Base Eye Exam     Visual Acuity (Snellen - Linear)       Right Left   Dist cc 20/15 20/15    Correction: Glasses         Tonometry (Tonopen, 9:16 AM)       Right Left   Pressure 16 16         Pupils       Dark Light  Shape React APD   Right 3 2 Round Brisk None   Left 3 2 Round Brisk None         Visual Fields (Counting fingers)       Left Right    Full Full         Extraocular Movement       Right Left    Full, Ortho Full, Ortho         Neuro/Psych     Oriented x3: Yes   Mood/Affect: Normal         Dilation     Both eyes: 1.0% Mydriacyl, 2.5% Phenylephrine @ 9:16 AM           Slit Lamp and Fundus Exam     Slit Lamp Exam       Right Left   Lids/Lashes Normal Normal   Conjunctiva/Sclera Mild nasal pingeucula, Subconjunctival hemorrhage Mild nasal pingeucula   Cornea Trace Punctate epithelial erosions, tear film debris Trace Punctate epithelial erosions, tear film debris   Anterior Chamber Deep and quiet Deep and quiet   Iris Round and dilated Round and dilated   Lens Clear Clear    Anterior Vitreous Vitreous syneresis, Posterior vitreous detachment, vitreous condensations Vitreous syneresis, vitreous condensations         Fundus Exam       Right Left   Disc Tilted disc, Pink and Sharp, mild PPP superiorly Pink and Sharp, Tilted cup, mild PPA   C/D Ratio 0.2 0.2   Macula Flat, Good foveal reflex, RPE mottling Flat, Good foveal reflex, RPE mottling, No heme or edema   Vessels Trace Copper wiring Trace Copper wiring   Periphery Attached, pigmented lattice at 0430, mild pavingstone inferiorly, multiple patches of pigmented lattice w/ atrophic holes temporal and superior quads from 0600-0130 -- good laser surrounding all lesions, spanning 0430-0130 temporally, no new RT/RD/lattice Attached, pigmented lattice superiorly from 1030-0200 with atrophic hole at 0200, pigmented lattice at 0330-0700 with atrophic holes at 0430 and 0500 -- with good laser surrounding all lesions, new VR tuft at 0900 -- good laser surrounding, no new RT/RD/lattice           Refraction     Wearing Rx       Sphere Cylinder Axis   Right -9.25 +1.25 125   Left -10.25 +1.50 029            IMAGING AND PROCEDURES  Imaging and Procedures for 04/12/2021  OCT, Retina - OU - Both Eyes       Right Eye Quality was good. Central Foveal Thickness: 312. Progression has been stable. Findings include normal foveal contour, no IRF, no SRF, myopic contour, vitreomacular adhesion .   Left Eye Quality was good. Central Foveal Thickness: 307. Progression has been stable. Findings include normal foveal contour, no IRF, no SRF, vitreomacular adhesion , myopic contour.   Notes *Images captured and stored on drive  Diagnosis / Impression:  NFP, no IRF/SRF OU  Clinical management:  See below  Abbreviations: NFP - Normal foveal profile. CME - cystoid macular edema. PED - pigment epithelial detachment. IRF - intraretinal fluid. SRF - subretinal fluid. EZ - ellipsoid zone. ERM - epiretinal membrane. ORA  - outer retinal atrophy. ORT - outer retinal tubulation. SRHM - subretinal hyper-reflective material. IRHM - intraretinal hyper-reflective material            ASSESSMENT/PLAN:    ICD-10-CM   1. Bilateral retinal lattice degeneration  H35.413 OCT, Retina - OU - Both Eyes  2. Retinal hole of both eyes  H33.323     3. Diabetes mellitus type 2 without retinopathy ([place])  E11.9     4. Severe myopia of both eyes  H52.13       1,2. Lattice degeneration w/ atrophic holes, both eyes - OD: pigmented lattice at 0430, multiple patches of pigmented lattice w/ atrophic holes ST and IT quad - OS: pigmented lattice superiorly from 1030-0200 with atrophic hole at 0200, pigmented lattice at 0330-0700 with atrophic holes at 0430 and 0500; - focal VR tuft  / retinal defect noted at 0900 position OS - noted on 02.02.23 exam - s/p laser retinopexy OS (12.08.22) -- good laser surrounding - s/p laser retinopexy touch up OS (02.02.23) -- good laser surrounding - s/p laser retinopexy OD (12.15.22) -- good laser surrounding - no new RT/RD/lattice OU - completed Lotemax SM QID OS x7 days - f/u 6 months, DFE, OCT  3. Diabetes mellitus, type 2 without retinopathy - The incidence, risk factors for progression, natural history and treatment options for diabetic retinopathy  were discussed with patient.   - The need for close monitoring of blood glucose, blood pressure, and serum lipids, avoiding cigarette or any type of tobacco, and the need for long term follow up was also discussed with patient.  - f/u in 1 year, sooner prn  4. High myopia  - planning for L[facility]  - clear from a retina standpoint to proceed with LASIK when pt and surgeon are ready   Ophthalmic Meds Ordered this visit:  No orders of the defined types were placed in this encounter.    Return in about 6 months (around 10/10/2021) for f/u 6 months, lattice degeneration OU, DFE, OCT.  There are no Patient Instructions on file for  this visit.   Explained the diagnoses, plan, and follow up with the patient and they expressed understanding.  Patient expressed understanding of the importance of proper follow up care.   This document serves as a record of services personally performed by Gardiner Sleeper, MD, PhD. It was created on their behalf by Leonie Douglas, an ophthalmic technician. The creation of this record is the provider's dictation and/or activities during the visit.    Electronically signed by: Leonie Douglas COA, 04/12/21  9:49 AM  This document serves as a record of services personally performed by Gardiner Sleeper, MD, PhD. It was created on their behalf by San Jetty. Owens Shark, OA an ophthalmic technician. The creation of this record is the provider's dictation and/or activities during the visit.    Electronically signed by: San Jetty. Owens Shark, [place] 02.17.2023 9:49 AM  Gardiner Sleeper, M.D., Ph.D. Diseases & Surgery of the Retina and Vitreous [facility]  I have reviewed the above documentation for accuracy and completeness, and I agree with the above. Gardiner Sleeper, M.D., Ph.D. 04/12/21 9:50 AM   Abbreviations: M myopia (nearsighted); A astigmatism; H hyperopia (farsighted); P presbyopia; Mrx spectacle prescription;  CTL contact lenses; OD right eye; OS left eye; OU both eyes  XT exotropia; ET esotropia; PEK punctate epithelial keratitis; PEE punctate epithelial erosions; DES dry eye syndrome; MGD meibomian gland dysfunction; ATs artificial tears; PFAT's preservative free artificial tears; [place] nuclear sclerotic cataract; PSC posterior subcapsular cataract; ERM epi-retinal membrane; PVD posterior vitreous detachment; RD retinal detachment; DM diabetes mellitus; DR diabetic retinopathy; NPDR non-proliferative diabetic retinopathy; PDR proliferative diabetic retinopathy; CSME clinically significant macular edema; DME diabetic macular edema; dbh dot blot hemorrhages; CWS  cotton wool spot; POAG primary  open angle glaucoma; C/D cup-to-disc ratio; HVF humphrey visual field; GVF goldmann visual field; OCT optical coherence tomography; IOP intraocular pressure; BRVO Branch retinal vein occlusion; CRVO central retinal vein occlusion; CRAO central retinal artery occlusion; BRAO branch retinal artery occlusion; RT retinal tear; SB scleral buckle; PPV pars plana vitrectomy; VH Vitreous hemorrhage; PRP panretinal laser photocoagulation; IVK intravitreal kenalog; VMT vitreomacular traction; MH Macular hole;  NVD neovascularization of the disc; NVE neovascularization elsewhere; AREDS age related eye disease study; ARMD age related macular degeneration; POAG primary open angle glaucoma; EBMD epithelial/anterior basement membrane dystrophy; ACIOL anterior chamber intraocular lens; IOL intraocular lens; PCIOL posterior chamber intraocular lens; Phaco/IOL phacoemulsification with intraocular lens placement; [place] photorefractive keratectomy; LASIK laser assisted in situ keratomileusis; HTN hypertension; DM diabetes mellitus; COPD chronic obstructive pulmonary disease

## 2021-04-12 ENCOUNTER — Other Ambulatory Visit: Payer: Self-pay

## 2021-04-12 ENCOUNTER — Encounter (INDEPENDENT_AMBULATORY_CARE_PROVIDER_SITE_OTHER): Payer: Self-pay | Admitting: Ophthalmology

## 2021-04-12 ENCOUNTER — Ambulatory Visit (INDEPENDENT_AMBULATORY_CARE_PROVIDER_SITE_OTHER): Payer: Self-pay | Admitting: Ophthalmology

## 2021-04-12 DIAGNOSIS — H35413 Lattice degeneration of retina, bilateral: Secondary | ICD-10-CM

## 2021-04-12 DIAGNOSIS — H5213 Myopia, bilateral: Secondary | ICD-10-CM

## 2021-04-12 DIAGNOSIS — E119 Type 2 diabetes mellitus without complications: Secondary | ICD-10-CM

## 2021-04-12 DIAGNOSIS — H33323 Round hole, bilateral: Secondary | ICD-10-CM

## 2021-04-30 ENCOUNTER — Ambulatory Visit: Payer: Self-pay | Admitting: Pharmacist

## 2021-04-30 NOTE — Chronic Care Management (AMB) (Signed)
?  Chronic Care Management  ? ?Note ? ?04/30/2021 ?Name: MANOLITO JUREWICZ MRN: [id number] DOB: 1970/08/07 ? ? ? ?Closing pharmacy CCM case at this time.  Patient has clinic contact information for future questions or concerns.  ? ?[name], PharmD, [place], CPP ?Clinical Pharmacist ?Nature conservation officer at [employer] ?[phone] ? ?

## 2021-08-14 ENCOUNTER — Encounter: Payer: Self-pay | Admitting: Internal Medicine

## 2021-08-14 ENCOUNTER — Other Ambulatory Visit: Payer: Self-pay

## 2021-08-14 ENCOUNTER — Ambulatory Visit (INDEPENDENT_AMBULATORY_CARE_PROVIDER_SITE_OTHER): Payer: Self-pay | Admitting: Internal Medicine

## 2021-08-14 ENCOUNTER — Telehealth: Payer: Self-pay

## 2021-08-14 VITALS — BP 120/70 | HR 90 | Temp 98.1°F | Resp 18 | Ht 68.0 in | Wt 217.2 lb

## 2021-08-14 DIAGNOSIS — E559 Vitamin D deficiency, unspecified: Secondary | ICD-10-CM

## 2021-08-14 DIAGNOSIS — E349 Endocrine disorder, unspecified: Secondary | ICD-10-CM

## 2021-08-14 DIAGNOSIS — Z1159 Encounter for screening for other viral diseases: Secondary | ICD-10-CM

## 2021-08-14 DIAGNOSIS — Z1211 Encounter for screening for malignant neoplasm of colon: Secondary | ICD-10-CM

## 2021-08-14 DIAGNOSIS — E7800 Pure hypercholesterolemia, unspecified: Secondary | ICD-10-CM

## 2021-08-14 DIAGNOSIS — R5383 Other fatigue: Secondary | ICD-10-CM

## 2021-08-14 DIAGNOSIS — R002 Palpitations: Secondary | ICD-10-CM

## 2021-08-14 DIAGNOSIS — R748 Abnormal levels of other serum enzymes: Secondary | ICD-10-CM

## 2021-08-14 DIAGNOSIS — Z0000 Encounter for general adult medical examination without abnormal findings: Secondary | ICD-10-CM

## 2021-08-14 LAB — VITAMIN B12: Vitamin B-12: 1104 pg/mL — ABNORMAL HIGH (ref 211–911)

## 2021-08-14 LAB — BASIC METABOLIC PANEL
BUN: 16 mg/dL (ref 6–23)
CO2: 27 mEq/L (ref 19–32)
Calcium: 9.8 mg/dL (ref 8.4–10.5)
Chloride: 102 mEq/L (ref 96–112)
Creatinine, Ser: 1.08 mg/dL (ref 0.40–1.50)
GFR: 80.5 mL/min (ref >60.00)
Glucose, Bld: 86 mg/dL (ref 70–99)
Potassium: 4.3 mEq/L (ref 3.5–5.1)
Sodium: 137 mEq/L (ref 135–145)

## 2021-08-14 LAB — HEPATIC FUNCTION PANEL
ALT: 21 U/L (ref 0–53)
AST: 20 U/L (ref 0–37)
Albumin: 4.6 g/dL (ref 3.5–5.2)
Alkaline Phosphatase: 68 U/L (ref 39–117)
Bilirubin, Direct: 0.1 mg/dL (ref 0.0–0.3)
Total Bilirubin: 0.4 mg/dL (ref 0.2–1.2)
Total Protein: 7.2 g/dL (ref 6.0–8.3)

## 2021-08-14 LAB — LIPID PANEL
Cholesterol: 198 mg/dL (ref 0–200)
HDL: 31.4 mg/dL — ABNORMAL LOW (ref >39.00)
NonHDL: 166.93
Total CHOL/HDL Ratio: 6
Triglycerides: 269 mg/dL — ABNORMAL HIGH (ref 0.0–149.0)
VLDL: 53.8 mg/dL — ABNORMAL HIGH (ref 0.0–40.0)

## 2021-08-14 LAB — CBC WITH DIFFERENTIAL/PLATELET
Basophils Absolute: 0 10*3/uL (ref 0.0–0.1)
Basophils Relative: 0.4 % (ref 0.0–3.0)
Eosinophils Absolute: 0.1 10*3/uL (ref 0.0–0.7)
Eosinophils Relative: 1.4 % (ref 0.0–5.0)
HCT: 48.7 % (ref 39.0–52.0)
Hemoglobin: 16 g/dL (ref 13.0–17.0)
Lymphocytes Relative: 28 % (ref 12.0–46.0)
Lymphs Abs: 2.2 10*3/uL (ref 0.7–4.0)
MCHC: 32.8 g/dL (ref 30.0–36.0)
MCV: 89.7 fl (ref 78.0–100.0)
Monocytes Absolute: 0.5 10*3/uL (ref 0.1–1.0)
Monocytes Relative: 6.3 % (ref 3.0–12.0)
Neutro Abs: 5 10*3/uL (ref 1.4–7.7)
Neutrophils Relative %: 63.9 % (ref 43.0–77.0)
Platelets: 307 10*3/uL (ref 150.0–400.0)
RBC: 5.42 Mil/uL (ref 4.22–5.81)
RDW: 15 % (ref 11.5–15.5)
WBC: 7.8 10*3/uL (ref 4.0–10.5)

## 2021-08-14 LAB — PSA: PSA: 0.94 ng/mL (ref 0.10–4.00)

## 2021-08-14 LAB — LDL CHOLESTEROL, DIRECT: Direct LDL: 129 mg/dL

## 2021-08-14 LAB — TESTOSTERONE: Testosterone: 582.56 ng/dL (ref 300.00–890.00)

## 2021-08-14 LAB — VITAMIN D 25 HYDROXY (VIT D DEFICIENCY, FRACTURES): VITD: 72.23 ng/mL (ref 30.00–100.00)

## 2021-08-14 MED ORDER — NA SULFATE-K SULFATE-MG SULF 17.5-3.13-1.6 GM/177ML PO SOLN: 1.0000 | Freq: Once | ORAL | 0 refills | Status: AC

## 2021-08-14 NOTE — Telephone Encounter (Signed)
Gastroenterology Pre-Procedure Review  Request Date: 09/10/21 Requesting Physician: Dr. Tobi Bastos  PATIENT REVIEW QUESTIONS: The patient responded to the following health history questions as indicated:    1. Are you having any GI issues? no 2. Do you have a personal history of Polyps? no 3. Do you have a family history of Colon Cancer or Polyps? no 4. Diabetes Mellitus? no 5. Joint replacements in the past 12 months?no 6. Major health problems in the past 3 months?no 7. Any artificial heart valves, MVP, or defibrillator?no    MEDICATIONS & ALLERGIES:    Patient reports the following regarding taking any anticoagulation/antiplatelet therapy:   Plavix, Coumadin, Eliquis, Xarelto, Lovenox, Pradaxa, Brilinta, or Effient? no Aspirin? yes (81mg  daily)  Patient confirms/reports the following medications:  Current Outpatient Medications  Medication Sig Dispense Refill   aspirin EC 81 MG tablet Take 81 mg by mouth daily. Swallow whole.     Multiple Vitamins-Minerals (MULTIVITAMIN WITH MINERALS) tablet Take 1 tablet by mouth daily.     SEMAGLUTIDE-WEIGHT MANAGEMENT  Inject 30 mLs into the skin once a week. Per pt taking 30cc     Testosterone Cypionate 200 MG/ML SOLN Inject 1 mL as directed. Inject 1 mL into skin every 2 weeks     No current facility-administered medications for this visit.    Patient confirms/reports the following allergies:  No Known Allergies  No orders of the defined types were placed in this encounter.   AUTHORIZATION INFORMATION Primary Insurance: 1D#: Group #:  Secondary Insurance: 1D#: Group #:  SCHEDULE INFORMATION: Date: 09/10/21 Time: Location: [facility]

## 2021-08-14 NOTE — Progress Notes (Signed)
Patient ID: Devin Humphrey, male   DOB: [birth year], 51 y.o.   MRN: [id number]   Subjective:    Patient ID: Devin Humphrey, male    DOB: 09-Oct-1970, 51 y.o.   MRN: [id number]   Patient here for a scheduled follow up.   Chief Complaint  Patient presents with   Hyperlipidemia   Anxiety   .   HPI Reports going to a clinic in [place] ([place]) and is receiving semaglutide (compound) - for weight loss.  Was also told his vitamin D level was low, testosterone level was low and B12 was elevated.  Would like to have these tests rechecked.  Trying to stay active.  Is exercising.  ([place]).  No chest pain or sob with increased activity or exertion.  No cough or congestion.  No nausea or vomiting.  No abdominal pain or bowel problems.  Agreeable to colonoscopy.  Has noticed occasional palpitations at night.     Past Medical History:  Diagnosis Date   Acute respiratory failure with hypoxia ([facility])    CONTACT DERMATITIS&OTHER ECZEMA DUE TO PLANTS 08/19/2009   Qualifier: Diagnosis of  By: Thurmond Butts MD, [place]     Degenerative disc disease    cervical disc disease   GERD (gastroesophageal reflux disease)    Past Surgical History:  Procedure Laterality Date   COLONOSCOPY     UPPER GI ENDOSCOPY     Family History  Problem Relation Age of Onset   Hyperlipidemia Father    Heart disease Father        s/p CABG and stent   Stroke Father    Hypertension Father    Colon cancer Neg Hx    Prostate cancer Neg Hx    Social History   Socioeconomic History   Marital status: Married    Spouse name: Vale Mousseau   Number of children: Not on file   Years of education: Not on file   Highest education level: Not on file  Occupational History   Not on file  Tobacco Use   Smoking status: Never   Smokeless tobacco: Never  Vaping Use   Vaping Use: Never used  Substance and Sexual Activity   Alcohol use: Yes    Alcohol/week: 0.0 standard drinks of alcohol    Comment: occasional   Drug use: No    Sexual activity: Yes    Partners: Female  Other Topics Concern   Not on file  Social History Narrative   Not on file   Social Determinants of Health   Financial Resource Strain: Medium Risk (03/28/2021)   Overall Financial Resource Strain (CARDIA)    Difficulty of Paying Living Expenses: Somewhat hard  Food Insecurity: Unknown (01/24/2019)   Hunger Vital Sign    Worried About Running Out of Food in the Last Year: Patient refused    Ran Out of Food in the Last Year: Patient refused  Transportation Needs: No Transportation Needs (01/24/2019)   PRAPARE - Administrator, Civil Service (Medical): No    Lack of Transportation (Non-Medical): No  Physical Activity: Insufficiently Active (01/24/2019)   Exercise Vital Sign    Days of Exercise per Week: 3 days    Minutes of Exercise per Session: 30 min  Stress: Stress Concern Present (01/24/2019)   [employer] of Occupational Health - Occupational Stress Questionnaire    Feeling of Stress : Very much  Social Connections: Not on file     Review of Systems  Constitutional:  Negative for appetite change  and unexpected weight change.  HENT:  Negative for congestion and sinus pressure.   Respiratory:  Negative for cough, chest tightness and shortness of breath.   Cardiovascular:  Negative for chest pain and leg swelling.       Occasional palpitations at night.   Gastrointestinal:  Negative for abdominal pain, diarrhea, nausea and vomiting.  Genitourinary:  Negative for difficulty urinating and dysuria.  Musculoskeletal:  Negative for joint swelling and myalgias.  Skin:  Negative for color change and rash.  Neurological:  Negative for dizziness, light-headedness and headaches.  Psychiatric/Behavioral:  Negative for agitation and dysphoric mood.        Objective:     BP 120/70 (BP Location: Left Arm, Patient Position: Sitting, Cuff Size: Large)   Pulse 90   Temp 98.1 F (36.7 C) (Temporal)   Resp 18   Ht 5\' 8"   (1.727 m)   Wt 217 lb 3.2 oz (98.5 kg)   SpO2 99%   BMI 33.03 kg/m  Wt Readings from Last 3 Encounters:  08/14/21 217 lb 3.2 oz (98.5 kg)  03/28/21 198 lb (89.8 kg)  02/14/21 206 lb (93.4 kg)    Physical Exam Constitutional:      General: He is not in acute distress.    Appearance: Normal appearance. He is well-developed.  HENT:     Head: Normocephalic and atraumatic.     Right Ear: External ear normal.     Left Ear: External ear normal.  Eyes:     General: No scleral icterus.       Right eye: No discharge.        Left eye: No discharge.  Cardiovascular:     Rate and Rhythm: Normal rate and regular rhythm.  Pulmonary:     Effort: Pulmonary effort is normal. No respiratory distress.     Breath sounds: Normal breath sounds.  Abdominal:     General: Bowel sounds are normal.     Palpations: Abdomen is soft.     Tenderness: There is no abdominal tenderness.  Musculoskeletal:        General: No swelling or tenderness.     Cervical back: Neck supple. No tenderness.  Lymphadenopathy:     Cervical: No cervical adenopathy.  Skin:    Findings: No erythema or rash.  Neurological:     Mental Status: He is alert.  Psychiatric:        Mood and Affect: Mood normal.        Behavior: Behavior normal.      Outpatient Encounter Medications as of 08/14/2021  Medication Sig   aspirin EC 81 MG tablet Take 81 mg by mouth daily. Swallow whole.   Multiple Vitamins-Minerals (MULTIVITAMIN WITH MINERALS) tablet Take 1 tablet by mouth daily.   SEMAGLUTIDE-WEIGHT MANAGEMENT [place] Inject 30 mLs into the skin once a week. Per pt taking 30cc   Testosterone Cypionate 200 MG/ML SOLN Inject 1 mL as directed. Inject 1 mL into skin every 2 weeks   [DISCONTINUED] Cetirizine-Pseudoephedrine (ALLERGY D-12 PO) Take by mouth.   [DISCONTINUED] prednisoLONE acetate (PRED FORTE) 1 % ophthalmic suspension Place 1 drop into the left eye 4 (four) times daily.   [DISCONTINUED] thiamine 50 MG tablet Take 50 mg by  mouth daily.   [DISCONTINUED] tirzepatide [facility]) 5 MG/0.5ML Pen Inject 5 mg into the skin once a week.   No facility-administered encounter medications on file as of 08/14/2021.     Lab Results  Component Value Date   WBC 7.8 08/14/2021  HGB 16.0 08/14/2021   HCT 48.7 08/14/2021   PLT 307.0 08/14/2021   GLUCOSE 86 08/14/2021   CHOL 198 08/14/2021   TRIG 269.0 (H) 08/14/2021   HDL 31.40 (L) 08/14/2021   LDLDIRECT 129.0 08/14/2021   LDLCALC 125 (H) 02/14/2021   ALT 21 08/14/2021   AST 20 08/14/2021   NA 137 08/14/2021   K 4.3 08/14/2021   CL 102 08/14/2021   CREATININE 1.08 08/14/2021   BUN 16 08/14/2021   CO2 27 08/14/2021   TSH 1.64 02/14/2021   PSA 0.94 08/14/2021    DG Abdomen 1 View  Result Date: 12/30/2020 CLINICAL DATA:  Evaluation for obstruction. EXAM: ABDOMEN - 1 VIEW COMPARISON:  None. FINDINGS: The bowel gas pattern is normal. No radio-opaque calculi or other significant radiographic abnormality are seen. IMPRESSION: Negative. Electronically Signed   By: Ted Mcalpine M.D.   On: 12/30/2020 13:33       Assessment & Plan:   Problem List Items Addressed This Visit     Colon cancer screening    Refer to GI for colonoscopy.       Hypercholesterolemia    Low cholesterol diet and exercise.  Follow lipid panel.       Relevant Medications   aspirin EC 81 MG tablet   Other Relevant Orders   Lipid Profile (Completed)   Hepatic function panel (Completed)   Basic Metabolic Panel (BMET) (Completed)   CBC with Differential/Platelet (Completed)   Palpitations    Occasional palpitations - notices at night.  EKG - SR with no acute ischemic changes.  No chest pain or sob with increased activity or exertion.  Discussed further evaluation.  Discussed calcium score.  Agreeable.       Relevant Orders   CT CARDIAC SCORING   Other Visit Diagnoses     Screening for colon cancer    -  Primary   Relevant Orders   Ambulatory referral to Gastroenterology    Encounter for hepatitis C screening test for low risk patient       Relevant Orders   Hepatitis C Antibody (Completed)   Vitamin D deficiency       Relevant Orders   Vitamin D (25 hydroxy) (Completed)   Elevated vitamin B12 level       Relevant Orders   B12 (Completed)   Testosterone deficiency       Relevant Orders   Testosterone (Completed)   CBC with Differential/Platelet (Completed)   PSA (Completed)   Estradiol (Completed)   Palpitation       Relevant Orders   EKG 12-Lead (Completed)        Dale [place], MD

## 2021-08-15 ENCOUNTER — Telehealth: Payer: Self-pay

## 2021-08-15 LAB — ESTRADIOL: Estradiol: 61 pg/mL — ABNORMAL HIGH (ref <=39)

## 2021-08-15 LAB — HEPATITIS C ANTIBODY: Hepatitis C Ab: NONREACTIVE

## 2021-08-15 NOTE — Telephone Encounter (Signed)
LMTCB for lab results.  

## 2021-08-16 ENCOUNTER — Other Ambulatory Visit: Payer: Self-pay

## 2021-08-16 DIAGNOSIS — E7800 Pure hypercholesterolemia, unspecified: Secondary | ICD-10-CM

## 2021-08-16 MED ORDER — ROSUVASTATIN CALCIUM 10 MG PO TABS: 10.0000 mg | ORAL_TABLET | Freq: Every day | ORAL | 3 refills | Status: DC

## 2021-08-24 DIAGNOSIS — Z1211 Encounter for screening for malignant neoplasm of colon: Secondary | ICD-10-CM | POA: Insufficient documentation

## 2021-08-24 DIAGNOSIS — R002 Palpitations: Secondary | ICD-10-CM | POA: Insufficient documentation

## 2021-08-24 NOTE — Assessment & Plan Note (Signed)
Low cholesterol diet and exercise.  Follow lipid panel.   

## 2021-08-24 NOTE — Assessment & Plan Note (Signed)
Refer to GI for colonoscopy.

## 2021-08-24 NOTE — Assessment & Plan Note (Signed)
Occasional palpitations - notices at night.  EKG - SR with no acute ischemic changes.  No chest pain or sob with increased activity or exertion.  Discussed further evaluation.  Discussed calcium score.  Agreeable.

## 2021-09-10 ENCOUNTER — Ambulatory Visit
Admission: RE | Admit: 2021-09-10 | Discharge: 2021-09-10 | Disposition: A | Discharge status: Home / Self Care | Payer: No Typology Code available for payment source | Attending: Gastroenterology | Admitting: Gastroenterology

## 2021-09-10 ENCOUNTER — Ambulatory Visit: Payer: No Typology Code available for payment source | Admitting: Anesthesiology

## 2021-09-10 ENCOUNTER — Encounter
Admission: RE | Disposition: A | Discharge status: Home / Self Care | Payer: Self-pay | Source: Home / Self Care | Attending: Gastroenterology

## 2021-09-10 ENCOUNTER — Encounter: Payer: Self-pay | Admitting: Gastroenterology

## 2021-09-10 ENCOUNTER — Other Ambulatory Visit: Payer: Self-pay

## 2021-09-10 DIAGNOSIS — E669 Obesity, unspecified: Secondary | ICD-10-CM | POA: Insufficient documentation

## 2021-09-10 DIAGNOSIS — Z1211 Encounter for screening for malignant neoplasm of colon: Secondary | ICD-10-CM

## 2021-09-10 DIAGNOSIS — Z6831 Body mass index (BMI) 31.0-31.9, adult: Secondary | ICD-10-CM | POA: Insufficient documentation

## 2021-09-10 HISTORY — PX: COLONOSCOPY WITH PROPOFOL: SHX5780

## 2021-09-10 SURGERY — COLONOSCOPY WITH PROPOFOL
Anesthesia: General

## 2021-09-10 MED ORDER — LIDOCAINE HCL (CARDIAC) PF 100 MG/5ML IV SOSY
PREFILLED_SYRINGE | INTRAVENOUS | Status: DC | PRN
  Administered 2021-09-10: 100 mg via INTRAVENOUS

## 2021-09-10 MED ORDER — PROPOFOL 10 MG/ML IV BOLUS
INTRAVENOUS | Status: AC
  Filled 2021-09-10: qty 20

## 2021-09-10 MED ORDER — MIDAZOLAM HCL 2 MG/2ML IJ SOLN
INTRAMUSCULAR | Status: DC | PRN
  Administered 2021-09-10: 2 mg via INTRAVENOUS

## 2021-09-10 MED ORDER — MIDAZOLAM HCL 2 MG/2ML IJ SOLN
INTRAMUSCULAR | Status: AC
  Filled 2021-09-10: qty 2

## 2021-09-10 MED ORDER — PROPOFOL 10 MG/ML IV BOLUS
INTRAVENOUS | Status: DC | PRN
  Administered 2021-09-10: 70 mg via INTRAVENOUS

## 2021-09-10 MED ORDER — SODIUM CHLORIDE 0.9 % IV SOLN: INTRAVENOUS | Status: DC

## 2021-09-10 MED ORDER — PROPOFOL 500 MG/50ML IV EMUL
INTRAVENOUS | Status: DC | PRN
  Administered 2021-09-10: 165 ug/kg/min via INTRAVENOUS

## 2021-09-10 NOTE — Anesthesia Preprocedure Evaluation (Addendum)
Anesthesia Evaluation  Patient identified by MRN, date of birth, ID band Patient awake    Reviewed: Allergy & Precautions, NPO status , Patient's Chart, lab work & pertinent test results  Airway Mallampati: II  TM Distance: >3 FB Neck ROM: full    Dental no notable dental hx.    Pulmonary neg pulmonary ROS,    Pulmonary exam normal        Cardiovascular negative cardio ROS Normal cardiovascular exam     Neuro/Psych negative neurological ROS  negative psych ROS   GI/Hepatic negative GI ROS, Neg liver ROS,   Endo/Other  negative endocrine ROS  Renal/GU negative Renal ROS  negative genitourinary   Musculoskeletal  (+) Arthritis ,   Abdominal (+) + obese,   Peds  Hematology negative hematology ROS (+)   Anesthesia Other Findings Semaglutide use roughly one week ago with no symptoms of reflux  Past Medical History: No date: Acute respiratory failure with hypoxia ([facility]) 08/19/2009: CONTACT DERMATITIS&OTHER ECZEMA DUE TO PLANTS     Comment:  Qualifier: Diagnosis of  By: Thurmond Butts MD, Dennard Nip   No date: Degenerative disc disease     Comment:  cervical disc disease No date: GERD (gastroesophageal reflux disease)  Past Surgical History: No date: COLONOSCOPY No date: UPPER GI ENDOSCOPY     Reproductive/Obstetrics negative OB ROS                            Anesthesia Physical Anesthesia Plan  ASA: 2  Anesthesia Plan: General   Post-op Pain Management: Minimal or no pain anticipated   Induction: Intravenous  PONV Risk Score and Plan: Propofol infusion and TIVA  Airway Management Planned: Natural Airway  Additional Equipment:   Intra-op Plan:   Post-operative Plan:   Informed Consent: I have reviewed the patients History and Physical, chart, labs and discussed the procedure including the risks, benefits and alternatives for the proposed anesthesia with the patient or authorized  representative who has indicated his/her understanding and acceptance.     Dental Advisory Given  Plan Discussed with: Anesthesiologist, CRNA and Surgeon  Anesthesia Plan Comments:        Anesthesia Quick Evaluation

## 2021-09-10 NOTE — H&P (Signed)
Wyline Mood, MD [address], [place], [place], [address] [address], [place], [place], [address] Phone: [phone]  Fax: [phone]  Primary Care Physician:  Dale [place], MD   Pre-Procedure History & Physical: HPI:  Devin Humphrey is a 51 y.o. male is here for an colonoscopy.   Past Medical History:  Diagnosis Date   Acute respiratory failure with hypoxia (HCC)    CONTACT DERMATITIS&OTHER ECZEMA DUE TO PLANTS 08/19/2009   Qualifier: Diagnosis of  By: Thurmond Butts MD, [place]     Degenerative disc disease    cervical disc disease   GERD (gastroesophageal reflux disease)     Past Surgical History:  Procedure Laterality Date   COLONOSCOPY     UPPER GI ENDOSCOPY      Prior to Admission medications   Medication Sig Start Date End Date Taking? Authorizing Provider  aspirin EC 81 MG tablet Take 81 mg by mouth daily. Swallow whole.   Yes [provider]  Multiple Vitamins-Minerals (MULTIVITAMIN WITH MINERALS) tablet Take 1 tablet by mouth daily.   Yes [provider]  rosuvastatin (CRESTOR) 10 MG tablet Take 1 tablet (10 mg total) by mouth daily. 08/16/21   Dale Pollock, MD  SEMAGLUTIDE-WEIGHT MANAGEMENT [place] Inject 30 mLs into the skin once a week. Per pt taking 30cc    [provider]  Testosterone Cypionate 200 MG/ML SOLN Inject 1 mL as directed. Inject 1 mL into skin every 2 weeks    [provider]    Allergies as of 08/14/2021   (No Known Allergies)    Family History  Problem Relation Age of Onset   Hyperlipidemia Father    Heart disease Father        s/p CABG and stent   Stroke Father    Hypertension Father    Colon cancer Neg Hx    Prostate cancer Neg Hx     Social History   Socioeconomic History   Marital status: Married    Spouse name: Corben Auzenne   Number of children: Not on file   Years of education: Not on file   Highest education level: Not on file  Occupational History   Not on  file  Tobacco Use   Smoking status: Never   Smokeless tobacco: Never  Vaping Use   Vaping Use: Never used  Substance and Sexual Activity   Alcohol use: Yes    Alcohol/week: 0.0 standard drinks of alcohol    Comment: occasional   Drug use: No   Sexual activity: Yes    Partners: Female  Other Topics Concern   Not on file  Social History Narrative   Not on file   Social Determinants of Health   Financial Resource Strain: Medium Risk (03/28/2021)   Overall Financial Resource Strain (CARDIA)    Difficulty of Paying Living Expenses: Somewhat hard  Food Insecurity: Unknown (01/24/2019)   Hunger Vital Sign    Worried About Running Out of Food in the Last Year: Patient refused    Ran Out of Food in the Last Year: Patient refused  Transportation Needs: No Transportation Needs (01/24/2019)   PRAPARE - Administrator, Civil Service (Medical): No    Lack of Transportation (Non-Medical): No  Physical Activity: Insufficiently Active (01/24/2019)   Exercise Vital Sign    Days of Exercise per Week: 3 days    Minutes of Exercise per Session: 30 min  Stress: Stress Concern Present (01/24/2019)   [employer]  of Occupational Health - Occupational Stress Questionnaire    Feeling of Stress : Very much  Social Connections: Not on file  Intimate Partner Violence: Not on file    Review of Systems: See HPI, otherwise negative ROS  Physical Exam: BP 125/83   Pulse 88   Temp (!) 97.2 F (36.2 C) (Temporal)   Resp 20   Ht 5\' 8"  (1.727 m)   Wt 95.3 kg   SpO2 98%   BMI 31.93 kg/m  General:   Alert,  pleasant and cooperative in NAD Head:  Normocephalic and atraumatic. Neck:  Supple; no masses or thyromegaly. Lungs:  Clear throughout to auscultation, normal respiratory effort.    Heart:  +S1, +S2, Regular rate and rhythm, No edema. Abdomen:  Soft, nontender and nondistended. Normal bowel sounds, without guarding, and without rebound.   Neurologic:  Alert and  oriented x4;   grossly normal neurologically.  Impression/Plan: Devin Humphrey is here for an colonoscopy to be performed for Screening colonoscopy average risk   Risks, benefits, limitations, and alternatives regarding  colonoscopy have been reviewed with the patient.  Questions have been answered.  All parties agreeable.   Cyndi Lennert, MD  09/10/2021, 9:05 AM

## 2021-09-10 NOTE — Transfer of Care (Signed)
Immediate Anesthesia Transfer of Care Note  Patient: Devin Humphrey  Procedure(s) Performed: COLONOSCOPY WITH PROPOFOL  Patient Location: Endoscopy Unit  Anesthesia Type:General  Level of Consciousness: drowsy and patient cooperative  Airway & Oxygen Therapy: Patient Spontanous Breathing and Patient connected to face mask oxygen  Post-op Assessment: Report given to RN and Post -op Vital signs reviewed and stable  Post vital signs: Reviewed and stable  Last Vitals:  Vitals Value Taken Time  BP 91/67 09/10/21 0930  Temp 36.1 C 09/10/21 0930  Pulse 87 09/10/21 0932  Resp 12 09/10/21 0932  SpO2 100 % 09/10/21 0932  Vitals shown include unvalidated device data.  Last Pain:  Vitals:   09/10/21 0930  TempSrc: Tympanic  PainSc: Asleep         Complications: No notable events documented.

## 2021-09-10 NOTE — Anesthesia Postprocedure Evaluation (Signed)
Anesthesia Post Note  Patient: RAI SEVERNS  Procedure(s) Performed: COLONOSCOPY WITH PROPOFOL  Patient location during evaluation: Endoscopy Anesthesia Type: General Level of consciousness: awake and alert Pain management: pain level controlled Vital Signs Assessment: post-procedure vital signs reviewed and stable Respiratory status: spontaneous breathing, nonlabored ventilation and respiratory function stable Cardiovascular status: blood pressure returned to baseline and stable Postop Assessment: no apparent nausea or vomiting Anesthetic complications: no   No notable events documented.   Last Vitals:  Vitals:   09/10/21 0940 09/10/21 0950  BP: 105/70 109/74  Pulse: 84 88  Resp: 13 14  Temp:    SpO2: 100% 97%    Last Pain:  Vitals:   09/10/21 0950  TempSrc:   PainSc: 0-No pain                 Foye Deer

## 2021-09-10 NOTE — Op Note (Signed)
[facility] Patient Name: Devin Humphrey Procedure Date: 09/10/2021 9:03 AM MRN: [id number] Account #: [id number] Date of Birth: Aug 01, 1970 Admit Type: Outpatient Age: 51 Room: [facility] ENDO ROOM 2 Gender: Male Note Status: Finalized Instrument Name: Prentice Docker [id number] Procedure:             Colonoscopy Indications:           Screening for colorectal malignant neoplasm Providers:             Wyline Mood MD, MD Medicines:             Monitored Anesthesia Care Complications:         No immediate complications. Procedure:             Pre-Anesthesia Assessment:                        - Prior to the procedure, a History and Physical was                         performed, and patient medications, allergies and                         sensitivities were reviewed. The patient's tolerance                         of previous anesthesia was reviewed.                        - The risks and benefits of the procedure and the                         sedation options and risks were discussed with the                         patient. All questions were answered and informed                         consent was obtained.                        - ASA Grade Assessment: II - A patient with mild                         systemic disease.                        After obtaining informed consent, the colonoscope was                         passed under direct vision. Throughout the procedure,                         the patient's blood pressure, pulse, and oxygen                         saturations were monitored continuously. The                         Colonoscope was introduced through the anus and  advanced to the the cecum, identified by the                         appendiceal orifice. The colonoscopy was performed                         with ease. The patient tolerated the procedure well.                         The quality of the bowel  preparation was excellent. Findings:      The perianal and digital rectal examinations were normal.      The entire examined colon appeared normal on direct and retroflexion       views. Impression:            - The entire examined colon is normal on direct and                         retroflexion views.                        - No specimens collected. Recommendation:        - Discharge patient to home (with escort).                        - Resume previous diet.                        - Continue present medications.                        - Repeat colonoscopy in 10 years for screening                         purposes. Procedure Code(s):     --- Professional ---                        [id number], Colonoscopy, flexible; diagnostic, including                         collection of specimen(s) by brushing or washing, when                         performed (separate procedure) Diagnosis Code(s):     --- Professional ---                        Z12.11, Encounter for screening for malignant neoplasm                         of colon CPT copyright 2019 American Medical Association. All rights reserved. The codes documented in this report are preliminary and upon coder review may  be revised to meet current compliance requirements. Wyline Mood, MD Wyline Mood MD, MD 09/10/2021 9:26:20 AM This report has been signed electronically. Number of Addenda: 0 Note Initiated On: 09/10/2021 9:03 AM Scope Withdrawal Time: 0 hours 7 minutes 47 seconds  Total Procedure Duration: 0 hours 10 minutes 57 seconds  Estimated Blood Loss:  Estimated blood loss: none.      [facility]

## 2021-09-10 NOTE — Anesthesia Procedure Notes (Signed)
Procedure Name: General with mask airway Date/Time: 09/10/2021 9:19 AM  Performed by: Mohammed Kindle, CRNAPre-anesthesia Checklist: Patient identified, Emergency Drugs available, Suction available and Patient being monitored Patient Re-evaluated:Patient Re-evaluated prior to induction Oxygen Delivery Method: Simple face mask Induction Type: IV induction Placement Confirmation: positive ETCO2, CO2 detector and breath sounds checked- equal and bilateral Dental Injury: Teeth and Oropharynx as per pre-operative assessment

## 2021-09-11 ENCOUNTER — Encounter: Payer: Self-pay | Admitting: Gastroenterology

## 2021-09-12 ENCOUNTER — Ambulatory Visit
Admission: RE | Admit: 2021-09-12 | Discharge: 2021-09-12 | Disposition: A | Discharge status: Home / Self Care | Payer: PRIVATE HEALTH INSURANCE | Source: Ambulatory Visit | Attending: Internal Medicine | Admitting: Internal Medicine

## 2021-09-12 DIAGNOSIS — R002 Palpitations: Secondary | ICD-10-CM | POA: Insufficient documentation

## 2021-10-11 ENCOUNTER — Encounter (INDEPENDENT_AMBULATORY_CARE_PROVIDER_SITE_OTHER): Payer: PRIVATE HEALTH INSURANCE | Admitting: Ophthalmology

## 2021-10-11 DIAGNOSIS — H35413 Lattice degeneration of retina, bilateral: Secondary | ICD-10-CM

## 2021-10-11 DIAGNOSIS — H5213 Myopia, bilateral: Secondary | ICD-10-CM

## 2021-10-11 DIAGNOSIS — H33323 Round hole, bilateral: Secondary | ICD-10-CM

## 2021-10-11 DIAGNOSIS — E119 Type 2 diabetes mellitus without complications: Secondary | ICD-10-CM

## 2021-10-15 IMAGING — DX DG CHEST 1V PORT
1 series · 1 of 1 positions shown · non-contrast
Comparison: None.

CLINICAL DATA: Chest tightness. Patient diagnosed with COVID-13 January, 2019

EXAM:
PORTABLE CHEST 1 VIEW

[chest ap]
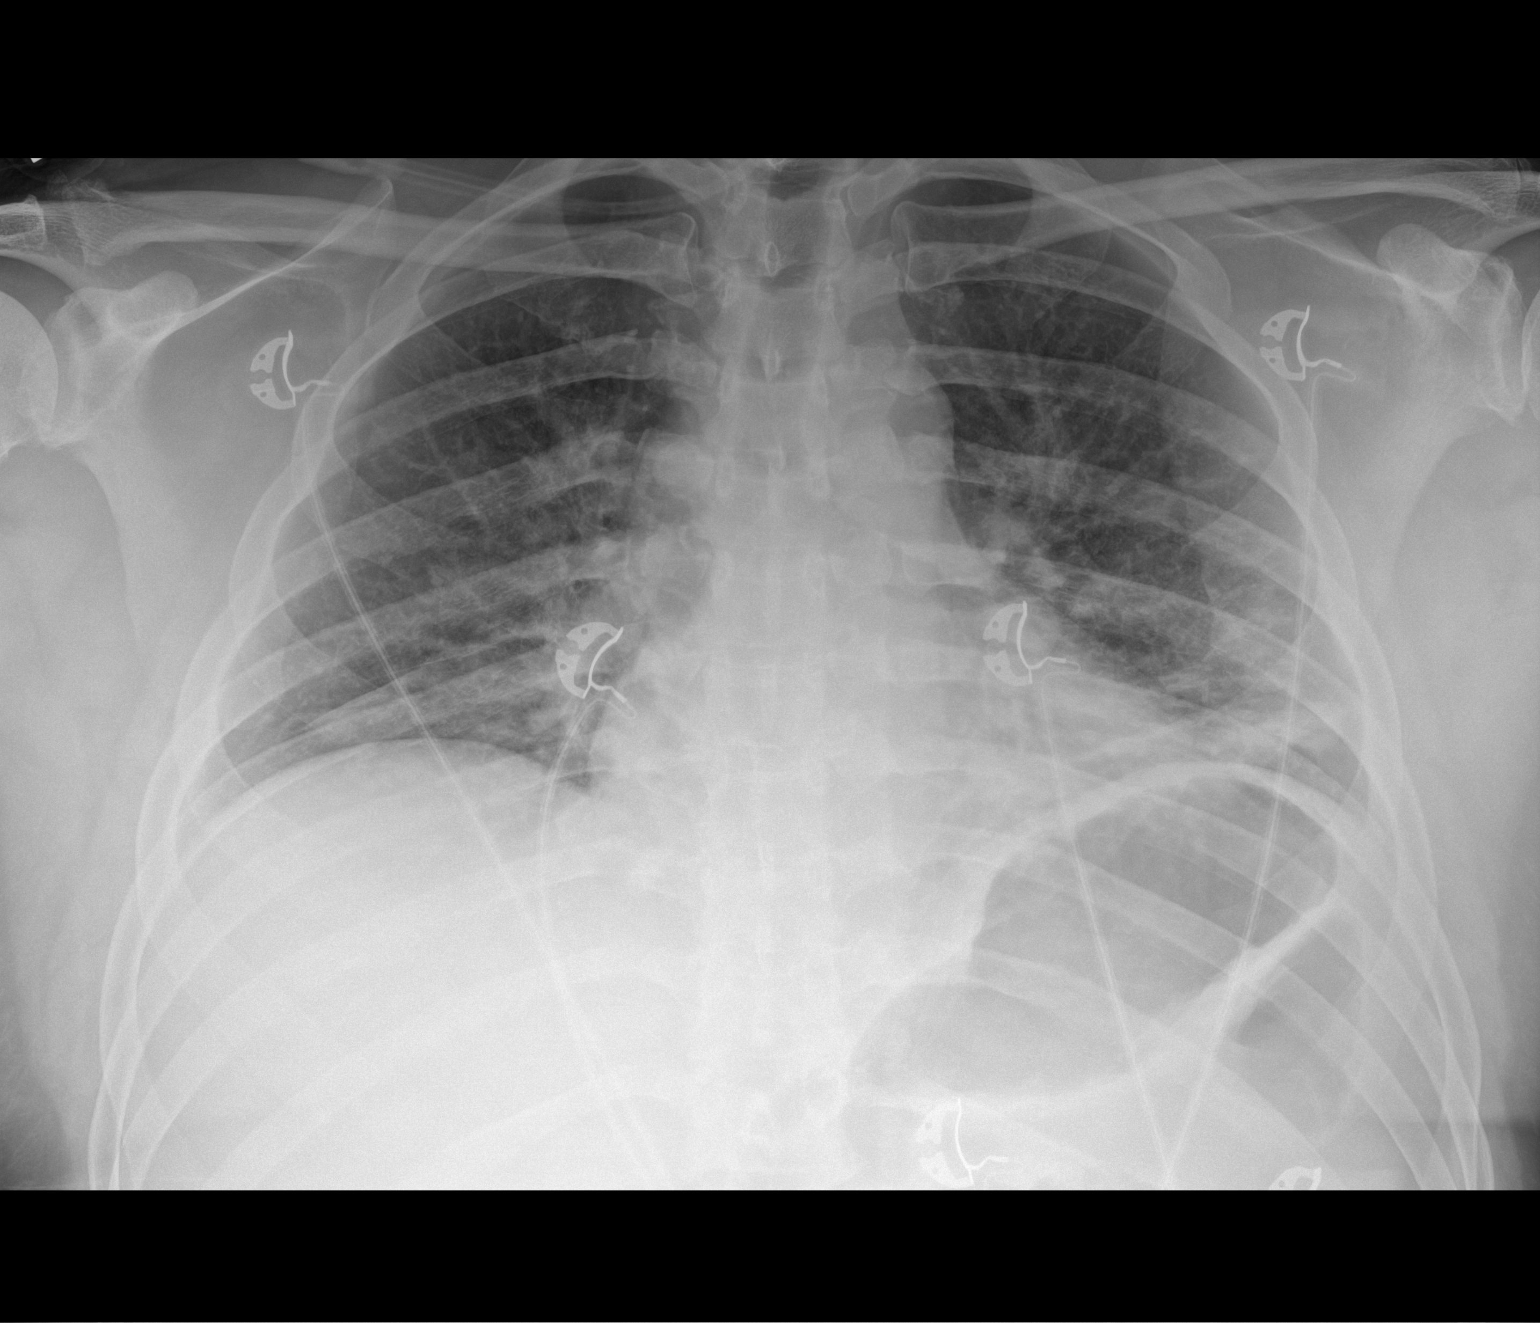

[1 of 1 positions shown; findings below may reference images not displayed]

FINDINGS: No pneumothorax. The cardiomediastinal silhouette is unremarkable.
There is patchy opacity in the left base. There may be mild opacity
in the medial right lung base. No other acute abnormalities.
IMPRESSION: Patchy opacity in the left base and possible mild opacity in the
medial right lung base. Recommend follow-up to resolution. No
pneumothorax. No other acute abnormalities.

## 2021-11-13 ENCOUNTER — Encounter (INDEPENDENT_AMBULATORY_CARE_PROVIDER_SITE_OTHER): Payer: PRIVATE HEALTH INSURANCE | Admitting: Ophthalmology

## 2021-11-17 ENCOUNTER — Encounter: Payer: Self-pay | Admitting: Internal Medicine

## 2021-11-19 MED ORDER — LORAZEPAM 0.5 MG PO TABS: ORAL_TABLET | ORAL | 0 refills | Status: DC

## 2021-11-26 NOTE — Telephone Encounter (Signed)
Please call and find out when symptoms started for both.  Also need to know what symptoms they are having and if they would be interested in oral antiviral.  Pending above, may need appt

## 2021-12-20 IMAGING — DX DG CHEST 2V
2 series · 2 of 2 positions shown · non-contrast
Comparison: 01/23/2019

CLINICAL DATA: History of EXY19-13 positivity in Wednesday December, 2018

EXAM:
CHEST - 2 VIEW

[chest pa]
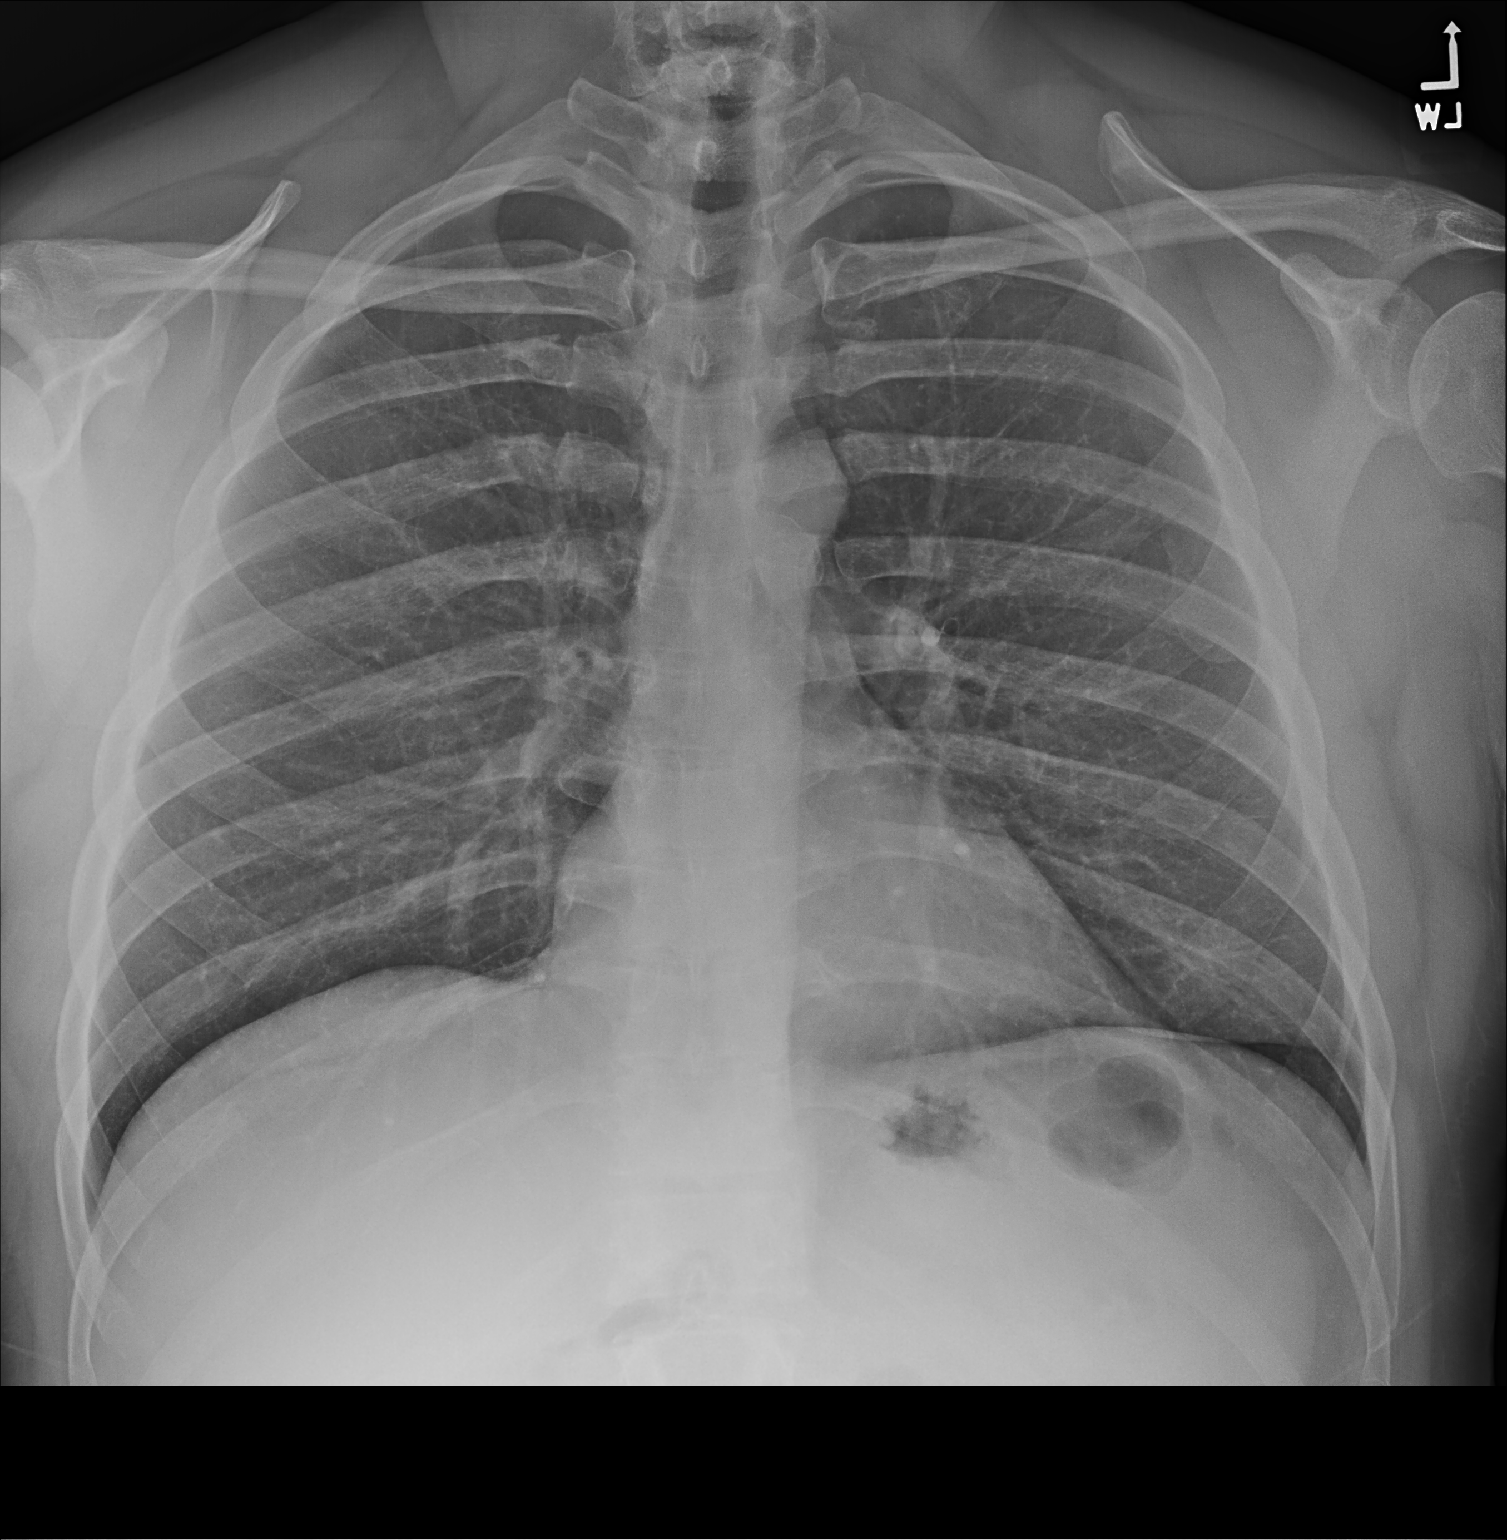

[chest lat]
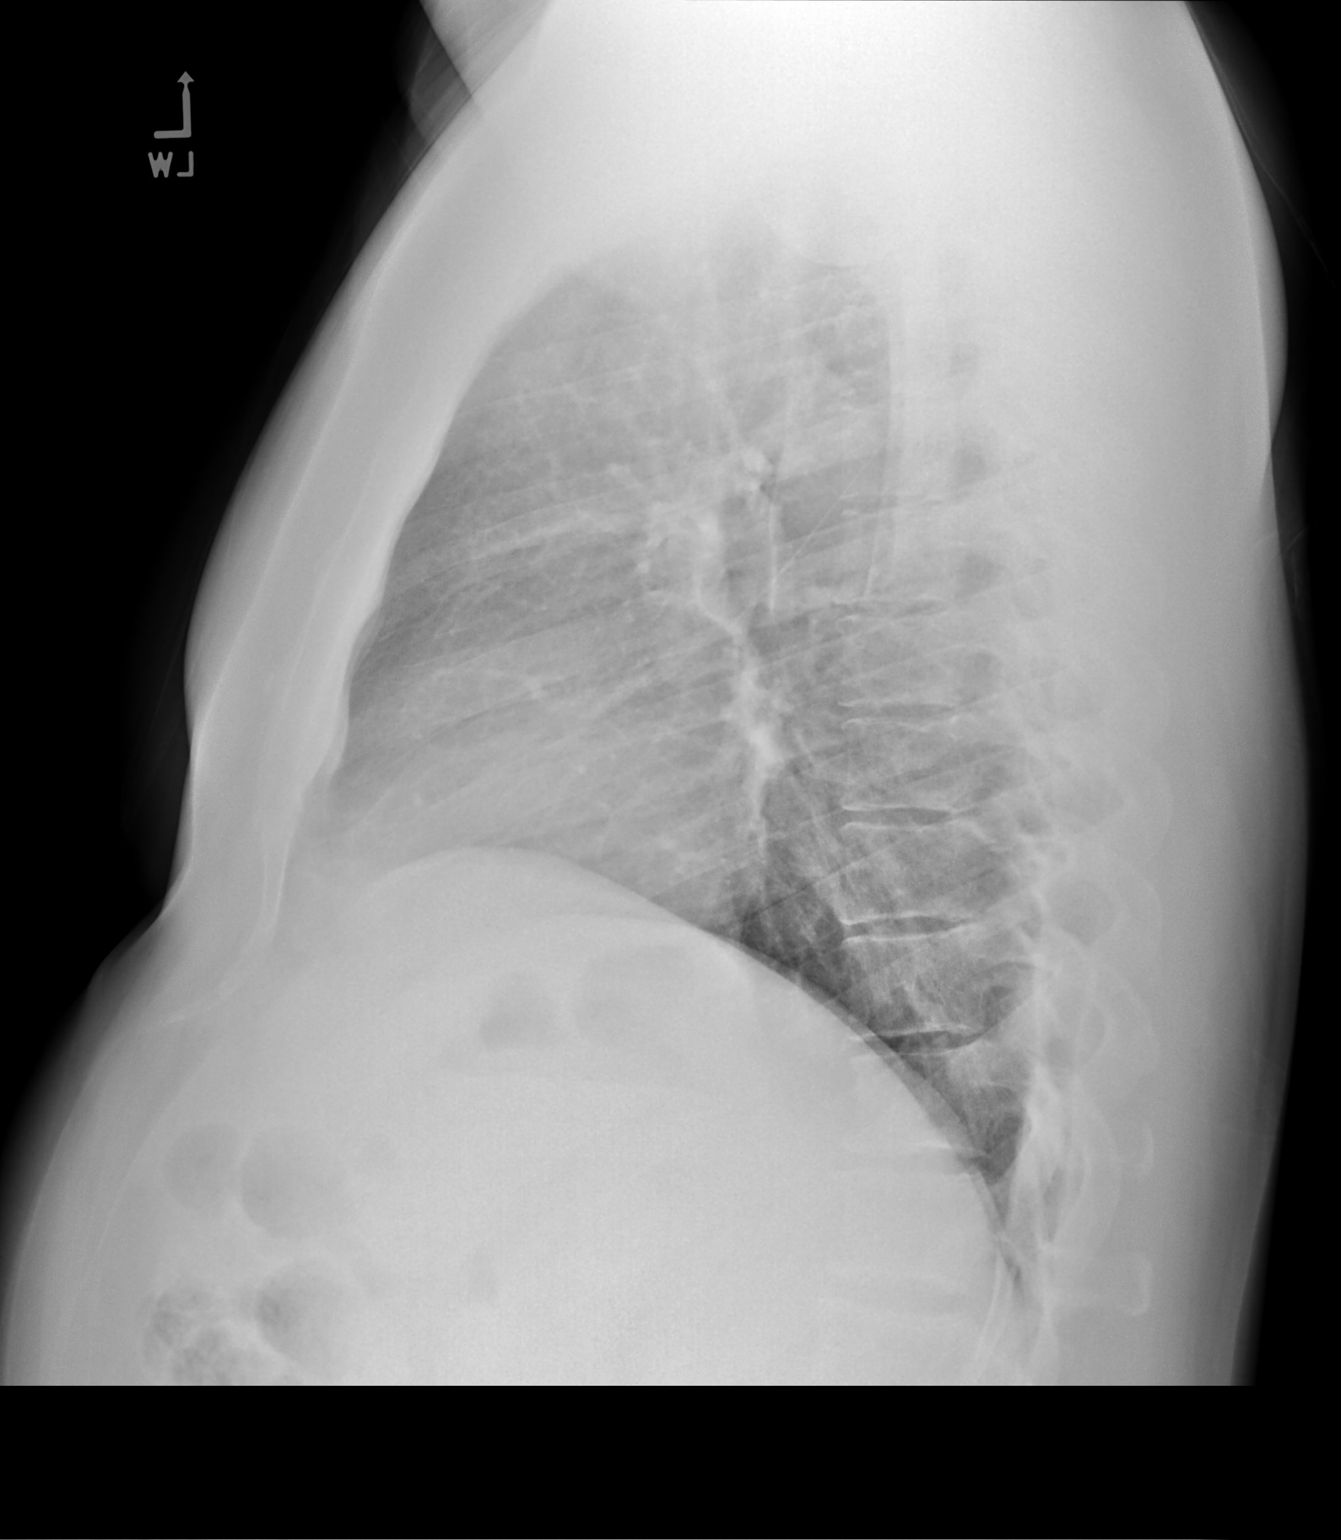

[2 of 2 positions shown; findings below may reference images not displayed]

FINDINGS: Cardiac shadow is within normal limits. The lungs are clear
bilaterally. No focal infiltrate or effusion is seen. No bony
abnormality is noted.
IMPRESSION: Resolution of previously seen infiltrates bilaterally. No acute
abnormality noted.

## 2022-02-26 ENCOUNTER — Ambulatory Visit (INDEPENDENT_AMBULATORY_CARE_PROVIDER_SITE_OTHER): Payer: 59 | Admitting: Internal Medicine

## 2022-02-26 ENCOUNTER — Encounter: Payer: Self-pay | Admitting: Internal Medicine

## 2022-02-26 VITALS — BP 120/78 | HR 90 | Temp 98.1°F | Resp 16 | Ht 68.0 in | Wt 218.0 lb

## 2022-02-26 DIAGNOSIS — Z125 Encounter for screening for malignant neoplasm of prostate: Secondary | ICD-10-CM

## 2022-02-26 DIAGNOSIS — E7800 Pure hypercholesterolemia, unspecified: Secondary | ICD-10-CM | POA: Diagnosis not present

## 2022-02-26 DIAGNOSIS — F419 Anxiety disorder, unspecified: Secondary | ICD-10-CM | POA: Diagnosis not present

## 2022-02-26 DIAGNOSIS — Z0000 Encounter for general adult medical examination without abnormal findings: Secondary | ICD-10-CM | POA: Diagnosis not present

## 2022-02-26 DIAGNOSIS — R198 Other specified symptoms and signs involving the digestive system and abdomen: Secondary | ICD-10-CM

## 2022-02-26 DIAGNOSIS — Z713 Dietary counseling and surveillance: Secondary | ICD-10-CM

## 2022-02-26 DIAGNOSIS — R002 Palpitations: Secondary | ICD-10-CM

## 2022-02-26 LAB — BASIC METABOLIC PANEL WITH GFR
BUN: 19 mg/dL (ref 6–23)
CO2: 24 meq/L (ref 19–32)
Calcium: 9.7 mg/dL (ref 8.4–10.5)
Chloride: 102 meq/L (ref 96–112)
Creatinine, Ser: 0.93 mg/dL (ref 0.40–1.50)
GFR: 95.96 mL/min (ref >60.00)
Glucose, Bld: 94 mg/dL (ref 70–99)
Potassium: 4.3 meq/L (ref 3.5–5.1)
Sodium: 139 meq/L (ref 135–145)

## 2022-02-26 LAB — LIPID PANEL
Cholesterol: 202 mg/dL — ABNORMAL HIGH (ref 0–200)
HDL: 36.5 mg/dL — ABNORMAL LOW (ref >39.00)
NonHDL: 165.1
Total CHOL/HDL Ratio: 6
Triglycerides: 315 mg/dL — ABNORMAL HIGH (ref 0.0–149.0)
VLDL: 63 mg/dL — ABNORMAL HIGH (ref 0.0–40.0)

## 2022-02-26 LAB — TSH: TSH: 1.16 u[IU]/mL (ref 0.35–5.50)

## 2022-02-26 LAB — HEPATIC FUNCTION PANEL
ALT: 41 U/L (ref 0–53)
AST: 27 U/L (ref 0–37)
Albumin: 4.8 g/dL (ref 3.5–5.2)
Alkaline Phosphatase: 71 U/L (ref 39–117)
Bilirubin, Direct: 0.1 mg/dL (ref 0.0–0.3)
Total Bilirubin: 0.4 mg/dL (ref 0.2–1.2)
Total Protein: 7.4 g/dL (ref 6.0–8.3)

## 2022-02-26 LAB — LDL CHOLESTEROL, DIRECT: Direct LDL: 114 mg/dL

## 2022-02-26 MED ORDER — WEGOVY 0.25 MG/0.5ML ~~LOC~~ SOAJ: 0.2500 mg | SUBCUTANEOUS | 2 refills | Status: DC

## 2022-02-26 MED ORDER — SERTRALINE HCL 50 MG PO TABS: ORAL_TABLET | ORAL | 3 refills | Status: DC

## 2022-02-26 NOTE — Assessment & Plan Note (Signed)
Physical today 02/26/22.  PSA 08/14/21 - .94.  Colonoscopy 09/10/21 - normal. Recommended f/u in 10 years.

## 2022-02-26 NOTE — Patient Instructions (Signed)
Benefiber - daily 

## 2022-02-26 NOTE — Progress Notes (Unsigned)
Subjective:    Patient ID: Devin Humphrey, male    DOB: 1970-11-27, 52 y.o.   MRN: [id number]  Patient here for No chief complaint on file.   HPI Here for physical exam.  Reports doing relatively well.  Previous issues with intermittent palpitations.  Calcium score zero.  Tries to stay active.  No chest pain or sob reported.  Was seeing a physician in [place] who had started him on testosterone.  Recent testosterone level here - wnl.     Past Medical History:  Diagnosis Date   Acute respiratory failure with hypoxia ([place])    CONTACT DERMATITIS&OTHER ECZEMA DUE TO PLANTS 08/19/2009   Qualifier: Diagnosis of  By: Alveta Heimlich MD, [place]     Degenerative disc disease    cervical disc disease   GERD (gastroesophageal reflux disease)    Past Surgical History:  Procedure Laterality Date   COLONOSCOPY     COLONOSCOPY WITH PROPOFOL N/A 09/10/2021   Procedure: COLONOSCOPY WITH PROPOFOL;  Surgeon: Jonathon Bellows, MD;  Location: [facility] ENDOSCOPY;  Service: Gastroenterology;  Laterality: N/A;   UPPER GI ENDOSCOPY     Family History  Problem Relation Age of Onset   Hyperlipidemia Father    Heart disease Father        s/p CABG and stent   Stroke Father    Hypertension Father    Colon cancer Neg Hx    Prostate cancer Neg Hx    Social History   Socioeconomic History   Marital status: Married    Spouse name: Mostafa Yuan   Number of children: Not on file   Years of education: Not on file   Highest education level: Not on file  Occupational History   Not on file  Tobacco Use   Smoking status: Never   Smokeless tobacco: Never  Vaping Use   Vaping Use: Never used  Substance and Sexual Activity   Alcohol use: Yes    Alcohol/week: 0.0 standard drinks of alcohol    Comment: occasional   Drug use: No   Sexual activity: Yes    Partners: Female  Other Topics Concern   Not on file  Social History Narrative   Not on file   Social Determinants of Health   Financial Resource Strain:  Medium Risk (03/28/2021)   Overall Financial Resource Strain (CARDIA)    Difficulty of Paying Living Expenses: Somewhat hard  Food Insecurity: Unknown (01/24/2019)   Hunger Vital Sign    Worried About Running Out of Food in the Last Year: Patient refused    [place] in the Last Year: Patient refused  Transportation Needs: No Transportation Needs (01/24/2019)   PRAPARE - Hydrologist (Medical): No    Lack of Transportation (Non-Medical): No  Physical Activity: Insufficiently Active (01/24/2019)   Exercise Vital Sign    Days of Exercise per Week: 3 days    Minutes of Exercise per Session: 30 min  Stress: Stress Concern Present (01/24/2019)   [place]    Feeling of Stress : Very much  Social Connections: Not on file     Review of Systems     Objective:     There were no vitals taken for this visit. Wt Readings from Last 3 Encounters:  09/10/21 210 lb (95.3 kg)  08/14/21 217 lb 3.2 oz (98.5 kg)  03/28/21 198 lb (89.8 kg)    Physical Exam   Outpatient Encounter Medications as  of 02/26/2022  Medication Sig   aspirin EC 81 MG tablet Take 81 mg by mouth daily. Swallow whole.   LORazepam (ATIVAN) 0.5 MG tablet 1/2 tablet bid prn   Multiple Vitamins-Minerals (MULTIVITAMIN WITH MINERALS) tablet Take 1 tablet by mouth daily.   rosuvastatin (CRESTOR) 10 MG tablet Take 1 tablet (10 mg total) by mouth daily.   SEMAGLUTIDE-WEIGHT MANAGEMENT  Inject 30 mLs into the skin once a week. Per pt taking 30cc   Testosterone Cypionate 200 MG/ML SOLN Inject 1 mL as directed. Inject 1 mL into skin every 2 weeks   No facility-administered encounter medications on file as of 02/26/2022.     Lab Results  Component Value Date   WBC 7.8 08/14/2021   HGB 16.0 08/14/2021   HCT 48.7 08/14/2021   PLT 307.0 08/14/2021   GLUCOSE 86 08/14/2021   CHOL 198 08/14/2021   TRIG 269.0 (H) 08/14/2021   HDL  31.40 (L) 08/14/2021   LDLDIRECT 129.0 08/14/2021   LDLCALC 125 (H) 02/14/2021   ALT 21 08/14/2021   AST 20 08/14/2021   NA 137 08/14/2021   K 4.3 08/14/2021   CL 102 08/14/2021   CREATININE 1.08 08/14/2021   BUN 16 08/14/2021   CO2 27 08/14/2021   TSH 1.64 02/14/2021   PSA 0.94 08/14/2021    CT CARDIAC SCORING  Addendum Date: 09/12/2021   ADDENDUM REPORT: 09/12/2021 16:00 ADDENDUM: OVER-READ INTERPRETATION  CT CHEST The following report is an over-read performed by radiologist Dr. Minerva Fester [facility] on 09/12/2021. This over-read does not include interpretation of cardiac or coronary anatomy or pathology. The coronary calcium scoring interpretation by the cardiologist is attached. Imaging of the chest is focused on cardiac structures and excludes much of the chest on CT. COMPARISON: None available FINDINGS: Cardiovascular: Please see dedicated report for cardiovascular details. Noncontrast appearance of the heart great vessels is grossly unremarkable. Mediastinum/Nodes: No acute process or signs of adenopathy in visualized portions of the mediastinum. Lungs/Pleura: Mild basilar atelectasis. No effusion. No consolidative changes. Visualized airways are patent. Upper Abdomen: No acute findings in the upper abdomen to the extent evaluated. Musculoskeletal: No acute or destructive bone process. IMPRESSION: No acute or significant extracardiac findings. Electronically Signed   By: Zetta Bills M.D.   On: 09/12/2021 16:00   Result Date: 09/12/2021 CLINICAL DATA:  Risk stratification EXAM: Coronary Calcium Score TECHNIQUE: The patient was scanned on a Siemens Somatom go.Top Scanner. Axial non-contrast 3 mm slices were carried out through the heart. The data set was analyzed on a dedicated work station and scored using the [place]. FINDINGS: Non-cardiac: See separate report from [facility]. Ascending Aorta: Normal size Pericardium: Normal Coronary arteries: Normal  origin of left and right coronary arteries. Distribution of arterial calcifications if present, as noted below; LM 0 LAD 0 LCx 0 RCA 0 Total 0 IMPRESSION AND RECOMMENDATION: 1. Normal coronary calcium score of 0. Patient is low risk for coronary events. 2.  CAC 0, CAC-DRS A0. 3.  Continue heart healthy lifestyle and risk factor modification. Electronically Signed: By: Kate Sable M.D. On: 09/12/2021 13:34       Assessment & Plan:  Hypercholesterolemia  Prostate cancer screening     Einar Pheasant, MD

## 2022-02-27 ENCOUNTER — Telehealth: Payer: Self-pay | Admitting: Internal Medicine

## 2022-02-27 ENCOUNTER — Encounter: Payer: Self-pay | Admitting: Internal Medicine

## 2022-02-27 DIAGNOSIS — R198 Other specified symptoms and signs involving the digestive system and abdomen: Secondary | ICD-10-CM | POA: Insufficient documentation

## 2022-02-27 NOTE — Assessment & Plan Note (Signed)
Actually reports formed stool daily.  No reports of increased constipation or diarrhea.  Does report leakage of stool as outlined.  Noticed after colonoscopy.  Sphincter tone appears to be normal.  No impaction.  Continue probiotic. Discussed fiber - benefiber.  Call with update.

## 2022-02-27 NOTE — Assessment & Plan Note (Signed)
Discussed diet/exercise and medication.  Discussed wegovy.  Discussed possible side effects.  States did ok with mounjaro previously.  Start low dose wegovy.  Follow.

## 2022-02-27 NOTE — Assessment & Plan Note (Signed)
Calcium score zero.  No problems reported today.

## 2022-02-27 NOTE — Telephone Encounter (Signed)
Patient returned office phone call for lab results. 

## 2022-02-27 NOTE — Assessment & Plan Note (Signed)
Discussed increased stress/anxiety.  Discussed treatment options. He does feel he needs to be on something to help lever things out.  Start zoloft as directed.  Follow.  Get him back in soon to reassess.

## 2022-02-27 NOTE — Assessment & Plan Note (Signed)
Low cholesterol diet and exercise.  Follow lipid panel.   

## 2022-02-28 ENCOUNTER — Telehealth: Payer: Self-pay | Admitting: Internal Medicine

## 2022-02-28 ENCOUNTER — Other Ambulatory Visit: Payer: Self-pay

## 2022-02-28 ENCOUNTER — Encounter: Payer: Self-pay | Admitting: Internal Medicine

## 2022-02-28 MED ORDER — ROSUVASTATIN CALCIUM 10 MG PO TABS: 10.0000 mg | ORAL_TABLET | Freq: Every day | ORAL | 3 refills | Status: DC

## 2022-02-28 NOTE — Telephone Encounter (Signed)
Pt wife called stating pt was supposed to be on a new cholesterol medicine but its the same one

## 2022-02-28 NOTE — Telephone Encounter (Signed)
See result note.  

## 2022-03-04 ENCOUNTER — Other Ambulatory Visit: Payer: Self-pay

## 2022-03-04 MED ORDER — ROSUVASTATIN CALCIUM 20 MG PO TABS: 20.0000 mg | ORAL_TABLET | Freq: Every day | ORAL | 1 refills | Status: DC

## 2022-03-04 NOTE — Telephone Encounter (Signed)
If already on 10mg  then increase to 20mg .  Inform him sorry for confusion.

## 2022-03-04 NOTE — Telephone Encounter (Signed)
Pt wife called in because of his cholesterol meds. She said provider had sent the same med that he has been for the past yr. She would like to get a call back on clarification on this matter. She's available at [phone]

## 2022-03-04 NOTE — Telephone Encounter (Signed)
PT ADVISED 20mg  called in

## 2022-03-04 NOTE — Telephone Encounter (Signed)
See phone msg

## 2022-03-17 ENCOUNTER — Other Ambulatory Visit (HOSPITAL_COMMUNITY): Payer: Self-pay

## 2022-03-19 ENCOUNTER — Other Ambulatory Visit (HOSPITAL_COMMUNITY): Payer: Self-pay

## 2022-04-11 ENCOUNTER — Telehealth: Payer: Self-pay

## 2022-04-11 NOTE — Telephone Encounter (Signed)
Pharmacy Patient Advocate Encounter  Received notification from [facility] that the request for prior authorization for [facility] has been denied due to .    Please be advised we currently do not have a Pharmacist to review denials, therefore you will need to process appeals accordingly as needed. Thanks for your support at this time.   You may fax [phone], to appeal.

## 2022-04-16 ENCOUNTER — Ambulatory Visit: Payer: 59 | Admitting: Internal Medicine

## 2022-04-16 ENCOUNTER — Encounter: Payer: Self-pay | Admitting: Internal Medicine

## 2022-04-16 VITALS — BP 124/76 | HR 84 | Temp 97.9°F | Resp 16 | Ht 68.5 in | Wt 214.0 lb

## 2022-04-16 DIAGNOSIS — R198 Other specified symptoms and signs involving the digestive system and abdomen: Secondary | ICD-10-CM

## 2022-04-16 DIAGNOSIS — E7800 Pure hypercholesterolemia, unspecified: Secondary | ICD-10-CM

## 2022-04-16 DIAGNOSIS — F419 Anxiety disorder, unspecified: Secondary | ICD-10-CM | POA: Diagnosis not present

## 2022-04-16 DIAGNOSIS — Z713 Dietary counseling and surveillance: Secondary | ICD-10-CM

## 2022-04-16 DIAGNOSIS — Z125 Encounter for screening for malignant neoplasm of prostate: Secondary | ICD-10-CM

## 2022-04-16 MED ORDER — SERTRALINE HCL 50 MG PO TABS: 50.0000 mg | ORAL_TABLET | Freq: Every day | ORAL | 1 refills | Status: DC

## 2022-04-16 NOTE — Progress Notes (Signed)
Subjective:    Patient ID: Devin Humphrey, male    DOB: 1970/11/25, 52 y.o.   MRN: [id number]  Patient here for  Chief Complaint  Patient presents with   Medical Management of Chronic Issues    HPI Previous issues with intermittent palpitations. Calcium score zero. Tries to stay active. No chest pain or sob reported. Last visit, reported increased stress/anxiety.  Was started on zoloft. Doing well on zoloft.  Overall doing better.  Has self driving car.  This has helped as well.  Anxiety - better controlled.  No chest pain or sob reported.  No abdominal pain reported. Had prescribed wegovy last visit.  Unable to get.  He is seeing Dr Jimmye Norman.  He is receiving compounded ozempic weekly at his office.  Discussed.  Last visit, issues with bowel leakage.  This has improved some.  Has not tried benefiber. Discussed.  Would prefer to hold on further testing.     Past Medical History:  Diagnosis Date   Acute respiratory failure with hypoxia ([place])    CONTACT DERMATITIS&OTHER ECZEMA DUE TO PLANTS 08/19/2009   Qualifier: Diagnosis of  By: Alveta Heimlich MD, [place]     Degenerative disc disease    cervical disc disease   GERD (gastroesophageal reflux disease)    Past Surgical History:  Procedure Laterality Date   COLONOSCOPY     COLONOSCOPY WITH PROPOFOL N/A 09/10/2021   Procedure: COLONOSCOPY WITH PROPOFOL;  Surgeon: Jonathon Bellows, MD;  Location: [facility] ENDOSCOPY;  Service: Gastroenterology;  Laterality: N/A;   UPPER GI ENDOSCOPY     Family History  Problem Relation Age of Onset   Hyperlipidemia Father    Heart disease Father        s/p CABG and stent   Stroke Father    Hypertension Father    Colon cancer Neg Hx    Prostate cancer Neg Hx    Social History   Socioeconomic History   Marital status: Married    Spouse name: Audel Antoniewicz   Number of children: Not on file   Years of education: Not on file   Highest education level: Not on file  Occupational History   Not on file  Tobacco  Use   Smoking status: Never   Smokeless tobacco: Never  Vaping Use   Vaping Use: Never used  Substance and Sexual Activity   Alcohol use: Yes    Alcohol/week: 0.0 standard drinks of alcohol    Comment: occasional   Drug use: No   Sexual activity: Yes    Partners: Female  Other Topics Concern   Not on file  Social History Narrative   Not on file   Social Determinants of Health   Financial Resource Strain: Medium Risk (03/28/2021)   Overall Financial Resource Strain (CARDIA)    Difficulty of Paying Living Expenses: Somewhat hard  Food Insecurity: Unknown (01/24/2019)   Hunger Vital Sign    Worried About Running Out of Food in the Last Year: Patient refused    [place] in the Last Year: Patient refused  Transportation Needs: No Transportation Needs (01/24/2019)   PRAPARE - Hydrologist (Medical): No    Lack of Transportation (Non-Medical): No  Physical Activity: Insufficiently Active (01/24/2019)   Exercise Vital Sign    Days of Exercise per Week: 3 days    Minutes of Exercise per Session: 30 min  Stress: Stress Concern Present (01/24/2019)   [place]  Feeling of Stress : Very much  Social Connections: Not on file     Review of Systems  Constitutional:  Negative for appetite change and unexpected weight change.  HENT:  Negative for congestion and sinus pressure.   Respiratory:  Negative for cough, chest tightness and shortness of breath.   Cardiovascular:  Negative for chest pain and palpitations.  Gastrointestinal:  Negative for abdominal pain, nausea and vomiting.  Genitourinary:  Negative for difficulty urinating and dysuria.  Musculoskeletal:  Negative for joint swelling and myalgias.  Skin:  Negative for color change and rash.  Neurological:  Negative for dizziness and headaches.  Psychiatric/Behavioral:  Negative for agitation and dysphoric mood.         Objective:     BP 124/76   Pulse 84   Temp 97.9 F (36.6 C)   Resp 16   Ht 5' 8.5" (1.74 m)   Wt 214 lb (97.1 kg)   SpO2 98%   BMI 32.07 kg/m  Wt Readings from Last 3 Encounters:  04/16/22 214 lb (97.1 kg)  02/26/22 218 lb (98.9 kg)  09/10/21 210 lb (95.3 kg)    Physical Exam Vitals reviewed.  Constitutional:      General: He is not in acute distress.    Appearance: Normal appearance. He is well-developed.  HENT:     Head: Normocephalic and atraumatic.     Right Ear: External ear normal.     Left Ear: External ear normal.  Eyes:     General: No scleral icterus.       Right eye: No discharge.        Left eye: No discharge.     Conjunctiva/sclera: Conjunctivae normal.  Cardiovascular:     Rate and Rhythm: Normal rate and regular rhythm.  Pulmonary:     Effort: Pulmonary effort is normal. No respiratory distress.     Breath sounds: Normal breath sounds.  Abdominal:     General: Bowel sounds are normal.     Palpations: Abdomen is soft.     Tenderness: There is no abdominal tenderness.  Musculoskeletal:        General: No swelling or tenderness.     Cervical back: Neck supple. No tenderness.  Lymphadenopathy:     Cervical: No cervical adenopathy.  Skin:    Findings: No erythema or rash.  Neurological:     Mental Status: He is alert.  Psychiatric:        Mood and Affect: Mood normal.        Behavior: Behavior normal.      Outpatient Encounter Medications as of 04/16/2022  Medication Sig   rosuvastatin (CRESTOR) 20 MG tablet Take 1 tablet (20 mg total) by mouth daily.   aspirin EC 81 MG tablet Take 81 mg by mouth daily. Swallow whole.   sertraline (ZOLOFT) 50 MG tablet Take 1 tablet (50 mg total) by mouth daily.   [DISCONTINUED] Semaglutide-Weight Management (WEGOVY) 0.25 MG/0.5ML SOAJ Inject 0.25 mg into the skin once a week.   [DISCONTINUED] sertraline (ZOLOFT) 50 MG tablet Take 1/2 tablet q day x 1 week and then one po q day (Patient taking differently:  Take 50 mg by mouth daily.)   No facility-administered encounter medications on file as of 04/16/2022.     Lab Results  Component Value Date   WBC 7.8 08/14/2021   HGB 16.0 08/14/2021   HCT 48.7 08/14/2021   PLT 307.0 08/14/2021   GLUCOSE 94 02/26/2022   CHOL 202 (H) 02/26/2022  TRIG 315.0 (H) 02/26/2022   HDL 36.50 (L) 02/26/2022   LDLDIRECT 114.0 02/26/2022   LDLCALC 125 (H) 02/14/2021   ALT 41 02/26/2022   AST 27 02/26/2022   NA 139 02/26/2022   K 4.3 02/26/2022   CL 102 02/26/2022   CREATININE 0.93 02/26/2022   BUN 19 02/26/2022   CO2 24 02/26/2022   TSH 1.16 02/26/2022   PSA 0.94 08/14/2021    CT CARDIAC SCORING  Addendum Date: 09/12/2021   ADDENDUM REPORT: 09/12/2021 16:00 ADDENDUM: OVER-READ INTERPRETATION  CT CHEST The following report is an over-read performed by radiologist Dr. Minerva Fester [facility] on 09/12/2021. This over-read does not include interpretation of cardiac or coronary anatomy or pathology. The coronary calcium scoring interpretation by the cardiologist is attached. Imaging of the chest is focused on cardiac structures and excludes much of the chest on CT. COMPARISON: None available FINDINGS: Cardiovascular: Please see dedicated report for cardiovascular details. Noncontrast appearance of the heart great vessels is grossly unremarkable. Mediastinum/Nodes: No acute process or signs of adenopathy in visualized portions of the mediastinum. Lungs/Pleura: Mild basilar atelectasis. No effusion. No consolidative changes. Visualized airways are patent. Upper Abdomen: No acute findings in the upper abdomen to the extent evaluated. Musculoskeletal: No acute or destructive bone process. IMPRESSION: No acute or significant extracardiac findings. Electronically Signed   By: Zetta Bills M.D.   On: 09/12/2021 16:00   Result Date: 09/12/2021 CLINICAL DATA:  Risk stratification EXAM: Coronary Calcium Score TECHNIQUE: The patient was scanned on a Siemens  Somatom go.Top Scanner. Axial non-contrast 3 mm slices were carried out through the heart. The data set was analyzed on a dedicated work station and scored using the [place]. FINDINGS: Non-cardiac: See separate report from [facility]. Ascending Aorta: Normal size Pericardium: Normal Coronary arteries: Normal origin of left and right coronary arteries. Distribution of arterial calcifications if present, as noted below; LM 0 LAD 0 LCx 0 RCA 0 Total 0 IMPRESSION AND RECOMMENDATION: 1. Normal coronary calcium score of 0. Patient is low risk for coronary events. 2.  CAC 0, CAC-DRS A0. 3.  Continue heart healthy lifestyle and risk factor modification. Electronically Signed: By: Kate Sable M.D. On: 09/12/2021 13:34       Assessment & Plan:  Hypercholesterolemia Assessment & Plan: Low cholesterol diet and exercise.  Follow lipid panel.   Orders: -     Lipid panel; Future -     Hepatic function panel; Future -     Basic metabolic panel; Future -     CBC with Differential/Platelet; Future  Prostate cancer screening -     PSA; Future  Anxiety Assessment & Plan: Discussed increased stress/anxiety. Doing better.  Feeling better.  Continue zoloft.    Change in bowel movement Assessment & Plan: Last visit reported leakage of stool as outlined.  Noticed after colonoscopy.  Sphincter tone normal.  No impaction.  Continue probiotic. Discussed fiber - benefiber.  Has not started.  Trial of benefiber.  Call with update.  Desires to hold on further testing/evaluation.    Weight loss counseling, encounter for Assessment & Plan: Have discussed diet/exercise and medication.  Discussed wegovy. Rx written last visit.  Unable to get.  Seeing Dr Jimmye Norman.  Receiving compounded ozempic weekly through his office. Discussed.  Follow.     Other orders -     Sertraline HCl; Take 1 tablet (50 mg total) by mouth daily.  Dispense: 90 tablet; Refill: 1     Einar Pheasant, MD

## 2022-04-27 ENCOUNTER — Encounter: Payer: Self-pay | Admitting: Internal Medicine

## 2022-04-27 NOTE — Assessment & Plan Note (Signed)
Low cholesterol diet and exercise.  Follow lipid panel.   

## 2022-04-27 NOTE — Assessment & Plan Note (Signed)
Discussed increased stress/anxiety. Doing better.  Feeling better.  Continue zoloft.

## 2022-04-27 NOTE — Assessment & Plan Note (Signed)
Have discussed diet/exercise and medication.  Discussed wegovy. Rx written last visit.  Unable to get.  Seeing Dr Jimmye Norman.  Receiving compounded ozempic weekly through his office. Discussed.  Follow.

## 2022-04-27 NOTE — Assessment & Plan Note (Signed)
Last visit reported leakage of stool as outlined.  Noticed after colonoscopy.  Sphincter tone normal.  No impaction.  Continue probiotic. Discussed fiber - benefiber.  Has not started.  Trial of benefiber.  Call with update.  Desires to hold on further testing/evaluation.

## 2022-06-14 ENCOUNTER — Ambulatory Visit
Admission: EM | Admit: 2022-06-14 | Discharge: 2022-06-14 | Disposition: A | Discharge status: Home / Self Care | Payer: 59

## 2022-06-14 DIAGNOSIS — J0100 Acute maxillary sinusitis, unspecified: Secondary | ICD-10-CM

## 2022-06-14 DIAGNOSIS — J029 Acute pharyngitis, unspecified: Secondary | ICD-10-CM | POA: Diagnosis not present

## 2022-06-14 LAB — POCT RAPID STREP A (OFFICE): Rapid Strep A Screen: NEGATIVE

## 2022-06-14 MED ORDER — AMOXICILLIN 875 MG PO TABS: 875.0000 mg | ORAL_TABLET | Freq: Two times a day (BID) | ORAL | 0 refills | Status: AC

## 2022-06-14 NOTE — ED Triage Notes (Signed)
Patient to Urgent Care with complaints of sore throat that started approx 3-4 days ago. Painful swallowing/ poor sleep. Denies any known fevers.   Taking sudafed. Using cough medications/ nasal spray.

## 2022-06-14 NOTE — Discharge Instructions (Addendum)
Take the amoxicillin as directed.  Follow up with your primary care provider if your symptoms are not improving.   ° ° °

## 2022-06-14 NOTE — ED Provider Notes (Signed)
Devin Humphrey    CSN: [id number] Arrival date & time: 06/14/22  0807      History   Chief Complaint Chief Complaint  Patient presents with   Sore Throat    HPI Devin Humphrey is a 52 y.o. male.  Patient presents with 7-8 day history of sinus congestion, sinus pressure, postnasal drip, sore throat, mild cough.  Various OTC treatments attempted without relief.  His sore throat has gotten worse.  No fever, rash, arthralgias, shortness of breath, or other symptoms.  No OTC medications taken today.  Patient had an e-visit with [facility] yesterday; diagnosed with URI, cough, pharyngitis; treated with Tessalon Perles, Atrovent nasal spray, Mucinex.  The history is provided by the patient and medical records.    Past Medical History:  Diagnosis Date   Acute respiratory failure with hypoxia    CONTACT DERMATITIS&OTHER ECZEMA DUE TO PLANTS 08/19/2009   Qualifier: Diagnosis of  By: Thurmond Butts MD, [place]     Degenerative disc disease    cervical disc disease   GERD (gastroesophageal reflux disease)     Patient Active Problem List   Diagnosis Date Noted   Change in bowel movement 02/27/2022   Palpitations 08/24/2021   Colon cancer screening 08/24/2021   Left shoulder pain 03/17/2020   Close exposure to COVID-19 virus 03/17/2020   BMI 33.0-33.9,adult 11/13/2019   Weight loss counseling, encounter for 07/17/2019   Fatigue 03/27/2019   Nocturnal oxygen desaturation 02/12/2019   Pneumonia due to COVID-19 virus 01/24/2019   Acute respiratory disease due to COVID-19 virus 01/23/2019   Syncope 01/23/2019   Health care maintenance 12/11/2015   Cervical spondylosis with radiculopathy 01/22/2015   Sinusitis 10/19/2014   AP (abdominal pain) 02/27/2014   Hypercholesterolemia 12/19/2013   Anxiety 10/09/2013   SOB (shortness of breath) 08/27/2013   Light headedness 09/25/2012   GERD (gastroesophageal reflux disease) 09/25/2012   Cervical disc disease 09/25/2012    Past  Surgical History:  Procedure Laterality Date   COLONOSCOPY     COLONOSCOPY WITH PROPOFOL N/A 09/10/2021   Procedure: COLONOSCOPY WITH PROPOFOL;  Surgeon: Wyline Mood, MD;  Location: [facility] ENDOSCOPY;  Service: Gastroenterology;  Laterality: N/A;   UPPER GI ENDOSCOPY         Home Medications    Prior to Admission medications   Medication Sig Start Date End Date Taking? Authorizing Provider  amoxicillin (AMOXIL) 875 MG tablet Take 1 tablet (875 mg total) by mouth 2 (two) times daily for 10 days. 06/14/22 06/24/22 Yes Mickie Bail, NP  benzonatate (TESSALON) 100 MG capsule Swallow whole one ( ) capsule by mouth 3 times a day  as needed.Do not break, chew, dissolve, cut or crush. 06/13/22 06/20/22 Yes [provider]  guaiFENesin (MUCINEX) 600 MG 12 hr tablet Take by mouth. 06/13/22 06/20/22 Yes [provider]  ipratropium (ATROVENT) 0.06 % nasal spray Place into the nose. 06/13/22 06/17/22 Yes [provider]  rosuvastatin (CRESTOR) 20 MG tablet Take 1 tablet (20 mg total) by mouth daily. 03/04/22   Dale [place], MD  aspirin EC 81 MG tablet Take 81 mg by mouth daily. Swallow whole.    [provider]  sertraline (ZOLOFT) 50 MG tablet Take 1 tablet (50 mg total) by mouth daily. 04/16/22   Dale [place], MD    Family History Family History  Problem Relation Age of Onset   Hyperlipidemia Father    Heart disease Father        s/p CABG and stent  Stroke Father    Hypertension Father    Colon cancer Neg Hx    Prostate cancer Neg Hx     Social History Social History   Tobacco Use   Smoking status: Never   Smokeless tobacco: Never  Vaping Use   Vaping Use: Never used  Substance Use Topics   Alcohol use: Yes    Alcohol/week: 0.0 standard drinks of alcohol    Comment: occasional   Drug use: No     Allergies   Patient has no known allergies.   Review of Systems Review of Systems  Constitutional:  Negative for chills and fever.  HENT:   Positive for congestion, postnasal drip, rhinorrhea, sinus pressure and sore throat. Negative for ear pain.   Respiratory:  Positive for cough. Negative for shortness of breath.   Cardiovascular:  Negative for chest pain and palpitations.  Gastrointestinal:  Negative for diarrhea and vomiting.  Skin:  Negative for color change and rash.  All other systems reviewed and are negative.    Physical Exam Triage Vital Signs ED Triage Vitals  Enc Vitals Group     BP      Pulse      Resp      Temp      Temp src      SpO2      Weight      Height      Head Circumference      Peak Flow      Pain Score      Pain Loc      Pain Edu?      Excl. in GC?    No data found.  Updated Vital Signs BP 120/80   Pulse (!) 104   Temp 98.2 F (36.8 C)   Resp 18   Ht  (1.753 m)   Wt 207 lb (93.9 kg)   SpO2 95%   BMI 30.57 kg/m   Visual Acuity Right Eye Distance:   Left Eye Distance:   Bilateral Distance:    Right Eye Near:   Left Eye Near:    Bilateral Near:     Physical Exam Vitals and nursing note reviewed.  Constitutional:      General: He is not in acute distress.    Appearance: Normal appearance. He is well-developed. He is not ill-appearing.  HENT:     Right Ear: Tympanic membrane normal.     Left Ear: Tympanic membrane normal.     Nose: Congestion and rhinorrhea present.     Mouth/Throat:     Mouth: Mucous membranes are moist.     Pharynx: Oropharynx is clear.     Comments: PND Cardiovascular:     Rate and Rhythm: Normal rate and regular rhythm.     Heart sounds: Normal heart sounds.  Pulmonary:     Effort: Pulmonary effort is normal. No respiratory distress.     Breath sounds: Normal breath sounds.  Musculoskeletal:     Cervical back: Neck supple.  Skin:    General: Skin is warm and dry.  Neurological:     Mental Status: He is alert.  Psychiatric:        Mood and Affect: Mood normal.        Behavior: Behavior normal.      UC Treatments / Results   Labs (all labs ordered are listed, but only abnormal results are displayed) Labs Reviewed  POCT RAPID STREP A (OFFICE)    EKG   Radiology No results found.  Procedures Procedures (including critical care time)  Medications Ordered in UC Medications - No data to display  Initial Impression / Assessment and Plan / UC Course  I have reviewed the triage vital signs and the nursing notes.  Pertinent labs & imaging results that were available during my care of the patient were reviewed by me and considered in my medical decision making (see chart for details).    Sore throat, acute sinusitis.  Rapid strep negative.  Patient has been symptomatic for a week and is not improving despite multiple OTC medications.  Treating today with amoxicillin.  Discussed ibuprofen and Mucinex with continued use of Atrovent nasal spray and Tessalon Perles that were prescribed on an e-visit yesterday.  Instructed patient to follow-up with his PCP if he is not improving.  He agrees to plan of care.  Final Clinical Impressions(s) / UC Diagnoses   Final diagnoses:  Sore throat  Acute non-recurrent maxillary sinusitis     Discharge Instructions      Take the amoxicillin as directed.  Follow up with your primary care provider if your symptoms are not improving.        ED Prescriptions     Medication Sig Dispense Auth. Provider   amoxicillin (AMOXIL) 875 MG tablet Take 1 tablet (875 mg total) by mouth 2 (two) times daily for 10 days. 20 tablet Mickie Bail, NP      PDMP not reviewed this encounter.   Mickie Bail, NP 06/14/22 [phone]

## 2022-06-18 ENCOUNTER — Encounter: Payer: Self-pay | Admitting: Family

## 2022-06-18 ENCOUNTER — Ambulatory Visit: Payer: 59 | Admitting: Family

## 2022-06-18 VITALS — BP 118/78 | HR 94 | Temp 97.7°F | Ht 69.0 in | Wt 215.8 lb

## 2022-06-18 DIAGNOSIS — F419 Anxiety disorder, unspecified: Secondary | ICD-10-CM

## 2022-06-18 DIAGNOSIS — E785 Hyperlipidemia, unspecified: Secondary | ICD-10-CM

## 2022-06-18 DIAGNOSIS — J40 Bronchitis, not specified as acute or chronic: Secondary | ICD-10-CM | POA: Diagnosis not present

## 2022-06-18 MED ORDER — SERTRALINE HCL 50 MG PO TABS: 50.0000 mg | ORAL_TABLET | Freq: Every day | ORAL | 1 refills | Status: DC

## 2022-06-18 MED ORDER — ROSUVASTATIN CALCIUM 20 MG PO TABS: 20.0000 mg | ORAL_TABLET | Freq: Every day | ORAL | 1 refills | Status: DC

## 2022-06-18 MED ORDER — PREDNISONE 10 MG PO TABS: ORAL_TABLET | ORAL | 0 refills | Status: DC

## 2022-06-18 NOTE — Patient Instructions (Addendum)
Start prednisone taper tomorrow as prednisone can interfere with sleep.  Please aim to get all doses then by 11 or 12pm  each day.  Complete amoxicillin.  Ensure to take probiotics while on antibiotics and also for 2 weeks after completion. This can either be by eating yogurt daily or taking a probiotic supplement over the counter such as Culturelle.It is important to re-colonize the gut with good bacteria and also to prevent any diarrheal infections associated with antibiotic use.    If cough persists, please let me know

## 2022-06-18 NOTE — Assessment & Plan Note (Signed)
No acute respiratory distress.  No wheezing on exam however patient describes wheezing at home.  He seems to be improved overall however cough is quite persistent.  We agreed to start prednisone taper over 4 days.  Counseled on side effects of medication.  He will complete amoxicillin as prescribed by urgent care.

## 2022-06-18 NOTE — Progress Notes (Signed)
Assessment & Plan:  Bronchitis Assessment & Plan: No acute respiratory distress.  No wheezing on exam however patient describes wheezing at home.  He seems to be improved overall however cough is quite persistent.  We agreed to start prednisone taper over 4 days.  Counseled on side effects of medication.  He will complete amoxicillin as prescribed by urgent care.  Orders: -     predniSONE; Take 40 mg by mouth on day 1, then taper 10 mg daily until gone  Dispense: 10 tablet; Refill: 0  Anxiety -     Sertraline HCl; Take 1 tablet (50 mg total) by mouth daily.  Dispense: 90 tablet; Refill: 1  Hyperlipidemia, unspecified hyperlipidemia type -     Rosuvastatin Calcium; Take 1 tablet (20 mg total) by mouth daily.  Dispense: 90 tablet; Refill: 1     Return precautions given.   Risks, benefits, and alternatives of the medications and treatment plan prescribed today were discussed, and patient expressed understanding.   Education regarding symptom management and diagnosis given to patient on AVS either electronically or printed.  No follow-ups on file.  Rennie Plowman, FNP  Subjective:    Patient ID: OZELL JUHASZ, male    DOB: 1970/04/12, 52 y.o.   MRN: [id number]  CC: SEANPAUL PREECE is a 52 y.o. male who presents today for an acute visit.    HPI: Complains of 'raspy' congestion.   He starts to cough and wheeze. Sore throat has improved and he feels congestion is 'clearing up' but cough continues to be bothersome.   No sob , cp, palpitations numbness to left arm and jaw, epigastric burning.Marland Kitchen  He is concerned he may need prednisone.  He has had prednisone in the past done well  He has been on prednisone in the past.   History of pneumonia, nocturnal oxygen desaturation, gerd  No history of lung disease.  Never smoker 4 days ago started on amoxicillin  after seen [facility]  Rapid strep negative.  Continue Mucinex, Atrovent nasal spray, Tessalon  perles   Allergies: Patient has no known allergies. Current Outpatient Medications on File Prior to Visit  Medication Sig Dispense Refill   amoxicillin (AMOXIL) 875 MG tablet Take 1 tablet (875 mg total) by mouth 2 (two) times daily for 10 days. 20 tablet 0   aspirin EC 81 MG tablet Take 81 mg by mouth daily. Swallow whole.     guaiFENesin (MUCINEX) 600 MG 12 hr tablet Take by mouth.     ipratropium (ATROVENT) 0.06 % nasal spray Place into the nose.     No current facility-administered medications on file prior to visit.    Review of Systems  Constitutional:  Negative for chills and fever.  HENT:  Positive for congestion. Negative for sore throat (resolved).   Respiratory:  Positive for cough and wheezing. Negative for shortness of breath.   Cardiovascular:  Negative for chest pain and palpitations.  Gastrointestinal:  Negative for nausea and vomiting.      Objective:    BP 118/78   Pulse 94   Temp 97.7 F (36.5 C) (Oral)   Ht  (1.753 m)   Wt 215 lb 12.8 oz (97.9 kg)   SpO2 96%   BMI 31.87 kg/m   BP Readings from Last 3 Encounters:  06/18/22 118/78  06/14/22 120/80  04/16/22 124/76   Wt Readings from Last 3 Encounters:  06/18/22 215 lb 12.8 oz (97.9 kg)  06/14/22 207 lb (93.9 kg)  04/16/22 214 lb (97.1 kg)    Physical Exam Vitals reviewed.  Constitutional:      Appearance: He is well-developed.  HENT:     Head: Normocephalic and atraumatic.     Right Ear: Hearing, tympanic membrane, ear canal and external ear normal. No decreased hearing noted. No drainage, swelling or tenderness. No middle ear effusion. Tympanic membrane is not injected, erythematous or bulging.     Left Ear: Hearing, tympanic membrane, ear canal and external ear normal. No decreased hearing noted. No drainage, swelling or tenderness.  No middle ear effusion. Tympanic membrane is not injected, erythematous or bulging.     Nose: Nose normal.     Right Sinus: No maxillary sinus tenderness or  frontal sinus tenderness.     Left Sinus: No maxillary sinus tenderness or frontal sinus tenderness.     Mouth/Throat:     Pharynx: Uvula midline. No oropharyngeal exudate or posterior oropharyngeal erythema.     Tonsils: No tonsillar abscesses.  Eyes:     Conjunctiva/sclera: Conjunctivae normal.  Cardiovascular:     Rate and Rhythm: Regular rhythm.     Heart sounds: Normal heart sounds.  Pulmonary:     Effort: Pulmonary effort is normal. No respiratory distress.     Breath sounds: Normal breath sounds. No wheezing, rhonchi or rales.  Lymphadenopathy:     Head:     Right side of head: No submental, submandibular, tonsillar, preauricular, posterior auricular or occipital adenopathy.     Left side of head: No submental, submandibular, tonsillar, preauricular, posterior auricular or occipital adenopathy.     Cervical: No cervical adenopathy.  Skin:    General: Skin is warm and dry.  Neurological:     Mental Status: He is alert.  Psychiatric:        Speech: Speech normal.        Behavior: Behavior normal.

## 2022-07-10 ENCOUNTER — Other Ambulatory Visit: Payer: 59

## 2022-07-11 ENCOUNTER — Other Ambulatory Visit: Payer: 59

## 2022-07-15 ENCOUNTER — Ambulatory Visit: Payer: 59 | Admitting: Internal Medicine

## 2022-07-16 ENCOUNTER — Other Ambulatory Visit: Payer: 59

## 2023-03-26 ENCOUNTER — Telehealth: Payer: Self-pay

## 2023-03-26 ENCOUNTER — Ambulatory Visit: Payer: Self-pay | Admitting: Internal Medicine

## 2023-03-26 ENCOUNTER — Encounter: Payer: Self-pay | Admitting: Internal Medicine

## 2023-03-26 NOTE — Telephone Encounter (Signed)
Copied from CRM [id number]. Topic: Clinical - Red Word Triage >> Mar 26, 2023  8:22 AM Truddie Crumble wrote: Red Word that prompted transfer to Nurse Triage: Patient spouse called stating the patient passed out this morning and patient stated he was fine and went to work   Chief Complaint: Syncopal episode Symptoms: fainting episode this morning but patient not currently having any symptoms Frequency: one episode this morning Pertinent Negatives: Patient denies chest pain, abdominal pain, blood in his stool, nausea, vomiting, diarrhea, pain, any noted injuries Disposition: [] ED /[] Urgent Care (no appt availability in office) / [] Appointment(In office/virtual)/ []  [facility]/ [x] Home Care/ [] Refused Recommended Disposition /[] [facility]/ []  Follow-up with PCP Additional Notes: Patient states that he felt like he just got overheated this morning and hadn't eaten yet.  Patient's wife was in the next room.  Patient states that he was in the bathroom on the toilet, bearing down, straining, when he started to feel hot and he called his wife into the bathroom.  He states that he did faint at this time, but just leaned over, did not fall off the toilet, did not hit his head, did not sustain any injuries that he is aware of, and his wife was calling his name and he came back around.  Patient states that he hydrated after this and feels a lot better now. Patient denies chest pain, abdominal pain, blood in his stool, nausea, vomiting, diarrhea, pain, any noted injuries. He advised that he is at work at this time and has no complaints at all.  He states that he believes this happened due to him being in the bathroom straining.  He is given home care advice and advised that if anything changes or this happens again, to go to the emergency room to get evaluated.  Patient verbalized understanding.  Reason for Disposition  [1] Fear, stress, or pain caused simple fainting AND [2] now alert and feels  fine  Answer Assessment - Initial Assessment Questions 1. ONSET: "How long were you unconscious?" (minutes) "When did it happen?"     "Didn't feel long at all" 2. CONTENT: "What happened during period of unconsciousness?" (e.g., seizure activity)      unsure 3. MENTAL STATUS: "Alert and oriented now?" (oriented x 3 = name, month, location)      Yes 4. TRIGGER: "What do you think caused the fainting?" "What were you doing just before you fainted?"  (e.g., exercise, sudden standing up, prolonged standing)     overheated 5. RECURRENT SYMPTOM: "Have you ever passed out before?" If Yes, ask: "When was the last time?" and "What happened that time?"      yes 6. INJURY: "Did you sustain any injury during the fall?"      no 7. CARDIAC SYMPTOMS: "Have you had any of the following symptoms: chest pain, difficulty breathing, palpitations?"     No 8. NEUROLOGIC SYMPTOMS: "Have you had any of the following symptoms: headache, numbness, vertigo, weakness?"     No 9. GI SYMPTOMS: "Have you had any of the following symptoms: abdomen pain, vomiting, diarrhea, blood in stools?"     No 10. OTHER SYMPTOMS: "Do you have any other symptoms?"       No  Protocols used: Fainting-A-AH

## 2023-03-26 NOTE — Telephone Encounter (Signed)
Agree with need for evaluation.  Per discussion declined evaluation today.  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 If any change or worsening problems, needs to be evaluated.

## 2023-03-26 NOTE — Telephone Encounter (Signed)
Agree with evaluation, especially if previous syncopal episode.

## 2023-03-26 NOTE — Telephone Encounter (Signed)
Called patient and wife answered. She reports that he did pass out about 2 weeks ago too. Advised that given 2 syncopal episodes within 2 weeks- needs to go to ED for evaluation today to confirm nothing acute is going on. Wife says she is going to try to convince him to go to the ED. I scheduled appt for tomorrow and to have EKG and be worked in for syncopal episode.

## 2023-03-26 NOTE — Telephone Encounter (Signed)
Called patient to follow up. Patient says that he was straining to use the bathroom and had not ate anything this morning so he fainted. He did not fall. Wife was present. After he came back patient feels fine was able to get up and eat and has went to work. He says he feels good. No acute issues currently. Advised that given he passed out he does need to be evaluated to confirm nothing acute. Pt stated he feels that it is from straining to use the bathroom but does not have a history of syncopal episodes. Patient says he spoke with a nurse earlier (E2C2) and they scheduled him for an acute visit on Monday. Advised again that he does need to be evaluated and did not feel he should wait through the weekend. Pt gave verbal understanding and says he has customers scheduled this PM and asked if there was anyway he could come for an appointment tomorrow.

## 2023-03-26 NOTE — Telephone Encounter (Signed)
FYI-  I talked with Misty Stanley and advised ED. She was going to try to get him to go. Appt has been scheduled with you tomorrow as discussed. Just wanted you to see this since pt reported no other syncopal episodes and  this message says otherwise. Wife reported another syncopal episode 2 weeks ago when I called her back today. Strongly recommended ED evaluation.

## 2023-03-26 NOTE — Telephone Encounter (Signed)
See other note

## 2023-03-26 NOTE — Telephone Encounter (Signed)
Copied from CRM [id number]. Topic: General - Other >> Mar 26, 2023  8:16 AM Truddie Crumble wrote: Reason for CRM: Patient spouse called in stating the patient passed out this morning and have questions about it

## 2023-03-27 ENCOUNTER — Ambulatory Visit: Payer: 59 | Admitting: Internal Medicine

## 2023-03-27 VITALS — BP 110/70 | HR 79 | Temp 98.2°F | Resp 16 | Ht 69.0 in | Wt 204.0 lb

## 2023-03-27 DIAGNOSIS — Z125 Encounter for screening for malignant neoplasm of prostate: Secondary | ICD-10-CM | POA: Diagnosis not present

## 2023-03-27 DIAGNOSIS — F419 Anxiety disorder, unspecified: Secondary | ICD-10-CM

## 2023-03-27 DIAGNOSIS — E7800 Pure hypercholesterolemia, unspecified: Secondary | ICD-10-CM | POA: Diagnosis not present

## 2023-03-27 DIAGNOSIS — R55 Syncope and collapse: Secondary | ICD-10-CM

## 2023-03-27 LAB — CBC WITH DIFFERENTIAL/PLATELET
Basophils Absolute: 0 10*3/uL (ref 0.0–0.1)
Basophils Relative: 0.4 % (ref 0.0–3.0)
Eosinophils Absolute: 0 10*3/uL (ref 0.0–0.7)
Eosinophils Relative: 0.8 % (ref 0.0–5.0)
HCT: 44.2 % (ref 39.0–52.0)
Hemoglobin: 14.6 g/dL (ref 13.0–17.0)
Lymphocytes Relative: 39.3 % (ref 12.0–46.0)
Lymphs Abs: 2.6 10*3/uL (ref 0.7–4.0)
MCHC: 33.1 g/dL (ref 30.0–36.0)
MCV: 86.2 fL (ref 78.0–100.0)
Monocytes Absolute: 0.4 10*3/uL (ref 0.1–1.0)
Monocytes Relative: 6.6 % (ref 3.0–12.0)
Neutro Abs: 3.5 10*3/uL (ref 1.4–7.7)
Neutrophils Relative %: 52.9 % (ref 43.0–77.0)
Platelets: 291 10*3/uL (ref 150.0–400.0)
RBC: 5.13 Mil/uL (ref 4.22–5.81)
RDW: 14.4 % (ref 11.5–15.5)
WBC: 6.6 10*3/uL (ref 4.0–10.5)

## 2023-03-27 LAB — HEPATIC FUNCTION PANEL
ALT: 21 U/L (ref 0–53)
AST: 17 U/L (ref 0–37)
Albumin: 4.5 g/dL (ref 3.5–5.2)
Alkaline Phosphatase: 78 U/L (ref 39–117)
Bilirubin, Direct: 0.1 mg/dL (ref 0.0–0.3)
Total Bilirubin: 0.3 mg/dL (ref 0.2–1.2)
Total Protein: 6.8 g/dL (ref 6.0–8.3)

## 2023-03-27 LAB — LIPID PANEL
Cholesterol: 197 mg/dL (ref 0–200)
HDL: 31.4 mg/dL — ABNORMAL LOW (ref >39.00)
LDL Cholesterol: 135 mg/dL — ABNORMAL HIGH (ref 0–99)
NonHDL: 165.31
Total CHOL/HDL Ratio: 6
Triglycerides: 154 mg/dL — ABNORMAL HIGH (ref 0.0–149.0)
VLDL: 30.8 mg/dL (ref 0.0–40.0)

## 2023-03-27 LAB — BASIC METABOLIC PANEL
BUN: 15 mg/dL (ref 6–23)
CO2: 29 meq/L (ref 19–32)
Calcium: 9 mg/dL (ref 8.4–10.5)
Chloride: 105 meq/L (ref 96–112)
Creatinine, Ser: 0.93 mg/dL (ref 0.40–1.50)
GFR: 95.24 mL/min (ref >60.00)
Glucose, Bld: 73 mg/dL (ref 70–99)
Potassium: 4.1 meq/L (ref 3.5–5.1)
Sodium: 142 meq/L (ref 135–145)

## 2023-03-27 LAB — TSH: TSH: 1.58 u[IU]/mL (ref 0.35–5.50)

## 2023-03-27 LAB — MAGNESIUM: Magnesium: 1.9 mg/dL (ref 1.5–2.5)

## 2023-03-27 LAB — PSA: PSA: 1.06 ng/mL (ref 0.10–4.00)

## 2023-03-27 NOTE — Progress Notes (Unsigned)
Subjective:    Patient ID: Devin Humphrey, male    DOB: 09-01-1970, 53 y.o.   MRN: [id number]  Patient here for  Chief Complaint  Patient presents with  . Loss of Consciousness    HPI Here for a work in appt - syncope.    Past Medical History:  Diagnosis Date  . Acute respiratory failure with hypoxia (HCC)   . CONTACT DERMATITIS&OTHER ECZEMA DUE TO PLANTS 08/19/2009   Qualifier: Diagnosis of  By: Thurmond Butts MD, Dennard Nip    . Degenerative disc disease    cervical disc disease  . GERD (gastroesophageal reflux disease)    Past Surgical History:  Procedure Laterality Date  . COLONOSCOPY    . COLONOSCOPY WITH PROPOFOL N/A 09/10/2021   Procedure: COLONOSCOPY WITH PROPOFOL;  Surgeon: Wyline Mood, MD;  Location: [facility] ENDOSCOPY;  Service: Gastroenterology;  Laterality: N/A;  . UPPER GI ENDOSCOPY     Family History  Problem Relation Age of Onset  . Hyperlipidemia Father   . Heart disease Father        s/p CABG and stent  . Stroke Father   . Hypertension Father   . Colon cancer Neg Hx   . Prostate cancer Neg Hx    Social History   Socioeconomic History  . Marital status: Married    Spouse name: Victorious Kundinger  . Number of children: Not on file  . Years of education: Not on file  . Highest education level: Not on file  Occupational History  . Not on file  Tobacco Use  . Smoking status: Never  . Smokeless tobacco: Never  Vaping Use  . Vaping status: Never Used  Substance and Sexual Activity  . Alcohol use: Yes    Alcohol/week: 0.0 standard drinks of alcohol    Comment: occasional  . Drug use: No  . Sexual activity: Yes    Partners: Female  Other Topics Concern  . Not on file  Social History Narrative  . Not on file   Social Drivers of Health   Financial Resource Strain: Medium Risk (03/28/2021)   Overall Financial Resource Strain (CARDIA)   . Difficulty of Paying Living Expenses: Somewhat hard  Food Insecurity: Unknown (01/24/2019)   Hunger Vital Sign   . Worried  About Programme researcher, broadcasting/film/video in the Last Year: Patient declined   . Ran Out of Food in the Last Year: Patient declined  Transportation Needs: No Transportation Needs (01/24/2019)   PRAPARE - Transportation   . Lack of Transportation (Medical): No   . Lack of Transportation (Non-Medical): No  Physical Activity: Insufficiently Active (01/24/2019)   Exercise Vital Sign   . Days of Exercise per Week: 3 days   . Minutes of Exercise per Session: 30 min  Stress: Stress Concern Present (01/24/2019)   [employer] of Occupational Health - Occupational Stress Questionnaire   . Feeling of Stress : Very much  Social Connections: Unknown (07/09/2021)   Received from [facility], [facility]   Social Network   . Social Network: Not on file     Review of Systems     Objective:     BP 110/70   Pulse 79   Temp 98.2 F (36.8 C)   Resp 16   Ht 5\' 9"  (1.753 m)   Wt 204 lb (92.5 kg)   SpO2 99%   BMI 30.13 kg/m  Wt Readings from Last 3 Encounters:  03/27/23 204 lb (92.5 kg)  06/18/22 215 lb 12.8 oz (97.9 kg)  06/14/22 207 lb (93.9 kg)    Physical Exam  {Perform Simple Foot Exam  Perform Detailed exam:1} {Insert foot Exam (Optional):30965}   Outpatient Encounter Medications as of 03/27/2023  Medication Sig  . aspirin EC 81 MG tablet Take 81 mg by mouth daily. Swallow whole.  . ipratropium (ATROVENT) 0.06 % nasal spray Place into the nose.  . predniSONE (DELTASONE) 10 MG tablet Take 40 mg by mouth on day 1, then taper 10 mg daily until gone  . rosuvastatin (CRESTOR) 20 MG tablet Take 1 tablet (20 mg total) by mouth daily.  . sertraline (ZOLOFT) 50 MG tablet Take 1 tablet (50 mg total) by mouth daily.   No facility-administered encounter medications on file as of 03/27/2023.     Lab Results  Component Value Date   WBC 7.8 08/14/2021   HGB 16.0 08/14/2021   HCT 48.7 08/14/2021   PLT 307.0 08/14/2021   GLUCOSE 94 02/26/2022   CHOL 202 (H) 02/26/2022   TRIG 315.0 (H)  02/26/2022   HDL 36.50 (L) 02/26/2022   LDLDIRECT 114.0 02/26/2022   LDLCALC 125 (H) 02/14/2021   ALT 41 02/26/2022   AST 27 02/26/2022   NA 139 02/26/2022   K 4.3 02/26/2022   CL 102 02/26/2022   CREATININE 0.93 02/26/2022   BUN 19 02/26/2022   CO2 24 02/26/2022   TSH 1.16 02/26/2022   PSA 0.94 08/14/2021    No results found.     Assessment & Plan:  Syncope, unspecified syncope type -     EKG 12-Lead     Dale Jewett, MD

## 2023-03-27 NOTE — Telephone Encounter (Signed)
 Pt being seen today

## 2023-03-29 ENCOUNTER — Encounter: Payer: Self-pay | Admitting: Internal Medicine

## 2023-03-29 NOTE — Assessment & Plan Note (Addendum)
Has had several episodes as outlined. Discussed possible etiologies. Discussed vasovagal syncope. EKG - SR - no acute ischemic changes. Concern regarding recent episode - seizure activity. Discussed with neurology. Discussed cortex insular seizure, etc. Neurology to work in - for further evaluation. Also discussed recommendation for further cardiac w/up.  Does have family history of heart issues. Father with CAD. Brother with cardiomyopathy. Agreeable for cardiac referral. Recent cardiac CT calcium score - 0. Discussed possible ECHO and monitor. Check cbc, metabolic panel and magnesium level.

## 2023-03-29 NOTE — Assessment & Plan Note (Addendum)
 Low-cholesterol diet and exercise.  Follow lipid panel. Continue crestor.

## 2023-03-29 NOTE — Assessment & Plan Note (Signed)
-

## 2023-03-30 ENCOUNTER — Ambulatory Visit: Payer: 59 | Admitting: Internal Medicine

## 2023-04-01 DIAGNOSIS — R55 Syncope and collapse: Secondary | ICD-10-CM | POA: Diagnosis not present

## 2023-04-02 ENCOUNTER — Other Ambulatory Visit: Payer: Self-pay | Admitting: Family

## 2023-04-02 DIAGNOSIS — E785 Hyperlipidemia, unspecified: Secondary | ICD-10-CM

## 2023-04-07 DIAGNOSIS — E782 Mixed hyperlipidemia: Secondary | ICD-10-CM | POA: Diagnosis not present

## 2023-04-07 DIAGNOSIS — E119 Type 2 diabetes mellitus without complications: Secondary | ICD-10-CM | POA: Diagnosis not present

## 2023-04-07 DIAGNOSIS — R55 Syncope and collapse: Secondary | ICD-10-CM | POA: Diagnosis not present

## 2023-04-08 NOTE — Telephone Encounter (Signed)
Patient was seen by Dr Melton Alar 04/07/23

## 2023-04-11 ENCOUNTER — Other Ambulatory Visit: Payer: Self-pay

## 2023-04-11 ENCOUNTER — Emergency Department
Admission: EM | Admit: 2023-04-11 | Discharge: 2023-04-11 | Disposition: A | Discharge status: Home / Self Care | Payer: 59 | Attending: Emergency Medicine | Admitting: Emergency Medicine

## 2023-04-11 DIAGNOSIS — R112 Nausea with vomiting, unspecified: Secondary | ICD-10-CM | POA: Diagnosis not present

## 2023-04-11 DIAGNOSIS — R55 Syncope and collapse: Secondary | ICD-10-CM | POA: Diagnosis not present

## 2023-04-11 DIAGNOSIS — A0811 Acute gastroenteropathy due to Norwalk agent: Secondary | ICD-10-CM | POA: Insufficient documentation

## 2023-04-11 DIAGNOSIS — R1110 Vomiting, unspecified: Secondary | ICD-10-CM

## 2023-04-11 DIAGNOSIS — R197 Diarrhea, unspecified: Secondary | ICD-10-CM | POA: Diagnosis not present

## 2023-04-11 LAB — GASTROINTESTINAL PANEL BY PCR, STOOL (REPLACES STOOL CULTURE)

## 2023-04-11 LAB — HEPATIC FUNCTION PANEL
ALT: 24 U/L (ref 0–44)
AST: 24 U/L (ref 15–41)
Albumin: 4.6 g/dL (ref 3.5–5.0)
Alkaline Phosphatase: 78 U/L (ref 38–126)
Bilirubin, Direct: 0.1 mg/dL (ref 0.0–0.2)
Indirect Bilirubin: 0.7 mg/dL (ref 0.3–0.9)
Total Bilirubin: 0.8 mg/dL (ref 0.0–1.2)
Total Protein: 8.1 g/dL (ref 6.5–8.1)

## 2023-04-11 LAB — URINALYSIS, ROUTINE W REFLEX MICROSCOPIC
Bilirubin Urine: NEGATIVE
Glucose, UA: NEGATIVE mg/dL
Hgb urine dipstick: NEGATIVE
Ketones, ur: NEGATIVE mg/dL
Leukocytes,Ua: NEGATIVE
Nitrite: NEGATIVE
Protein, ur: 100 mg/dL — AB
Specific Gravity, Urine: 1.024 (ref 1.005–1.030)
pH: 5 (ref 5.0–8.0)

## 2023-04-11 LAB — MAGNESIUM: Magnesium: 2.2 mg/dL (ref 1.7–2.4)

## 2023-04-11 LAB — CBC
HCT: 48.8 % (ref 39.0–52.0)
Hemoglobin: 16.2 g/dL (ref 13.0–17.0)
MCH: 28.2 pg (ref 26.0–34.0)
MCHC: 33.2 g/dL (ref 30.0–36.0)
MCV: 85 fL (ref 80.0–100.0)
Platelets: 277 10*3/uL (ref 150–400)
RBC: 5.74 MIL/uL (ref 4.22–5.81)
RDW: 13.6 % (ref 11.5–15.5)
WBC: 14.5 10*3/uL — ABNORMAL HIGH (ref 4.0–10.5)
nRBC: 0 % (ref 0.0–0.2)

## 2023-04-11 LAB — BASIC METABOLIC PANEL
Anion gap: 11 (ref 5–15)
BUN: 24 mg/dL — ABNORMAL HIGH (ref 6–20)
CO2: 23 mmol/L (ref 22–32)
Calcium: 9.3 mg/dL (ref 8.9–10.3)
Chloride: 103 mmol/L (ref 98–111)
Creatinine, Ser: 1.44 mg/dL — ABNORMAL HIGH (ref 0.61–1.24)
GFR, Estimated: 59 mL/min — ABNORMAL LOW (ref >60)
Glucose, Bld: 107 mg/dL — ABNORMAL HIGH (ref 70–99)
Potassium: 5.6 mmol/L — ABNORMAL HIGH (ref 3.5–5.1)
Sodium: 137 mmol/L (ref 135–145)

## 2023-04-11 LAB — CBG MONITORING, ED: Glucose-Capillary: 117 mg/dL — ABNORMAL HIGH (ref 70–99)

## 2023-04-11 LAB — TROPONIN I (HIGH SENSITIVITY): Troponin I (High Sensitivity): 4 ng/L (ref <18)

## 2023-04-11 MED ORDER — ONDANSETRON HCL 4 MG PO TABS: 4.0000 mg | ORAL_TABLET | Freq: Four times a day (QID) | ORAL | 0 refills | Status: AC | PRN

## 2023-04-11 MED ORDER — SODIUM CHLORIDE 0.9 % IV BOLUS: 500.0000 mL | Freq: Once | INTRAVENOUS | Status: DC

## 2023-04-11 MED ORDER — ONDANSETRON HCL 4 MG/2ML IJ SOLN
4.0000 mg | Freq: Once | INTRAMUSCULAR | Status: AC
  Administered 2023-04-11: 4 mg via INTRAVENOUS
  Filled 2023-04-11: qty 2

## 2023-04-11 MED ORDER — SODIUM CHLORIDE 0.9 % IV BOLUS
1000.0000 mL | Freq: Once | INTRAVENOUS | Status: AC
  Administered 2023-04-11: 1000 mL via INTRAVENOUS

## 2023-04-11 MED ORDER — ONDANSETRON 4 MG PO TBDP
4.0000 mg | ORAL_TABLET | Freq: Once | ORAL | Status: AC
  Administered 2023-04-11: 4 mg via ORAL
  Filled 2023-04-11: qty 1

## 2023-04-11 MED ORDER — SODIUM CHLORIDE 0.9 % IV BOLUS
1000.0000 mL | Freq: Once | INTRAVENOUS | Status: AC
Start: 2023-04-11 — End: 2023-04-11
  Administered 2023-04-11: 1000 mL via INTRAVENOUS

## 2023-04-11 NOTE — ED Notes (Signed)
Patient had a syncopal event in [place]. Pale, diaphoretic with syncope.  Moved to triage middle and lifted patient to stretcher.

## 2023-04-11 NOTE — ED Notes (Signed)
Dr. Rosalia Hammers informed pt has Noroviruis

## 2023-04-11 NOTE — ED Notes (Signed)
Pt had syncopal episode in lobby. Moved to stretcher. EKG VS done. Pt appeared pale and diaphoretic. Moved to room 12 now.

## 2023-04-11 NOTE — ED Provider Notes (Signed)
[facility] Provider Note    Event Date/Time   First MD Initiated Contact with Patient 04/11/23 1541     (approximate)   History   Near Syncope   HPI  Devin Humphrey is a 53 year old male with history of recurrent syncope presenting to the emergency department following a syncopal episode.  Earlier this morning, patient had onset of nausea, vomiting, diarrhea, nonbloody.  Has not been able to tolerate p.o. intake throughout the day.  A few hours prior to my evaluation, he began to feel warm and had a syncopal episode shortly after having diarrhea on the toilet.  His wife was in the shower and assisted him.    Over the past 2 months, he has had multiple episodes of syncope.  The first was shortly after showering when he felt warm, the second was after having a bowel movement.  He is seeing neurology and cardiology as an outpatient for further evaluation.    Physical Exam   Triage Vital Signs: ED Triage Vitals  Encounter Vitals Group     BP 04/11/23 1535 105/62     Systolic BP Percentile --      Diastolic BP Percentile --      Pulse Rate 04/11/23 1535 91     Resp 04/11/23 1535 20     Temp 04/11/23 1535 98.1 F (36.7 C)     Temp Source 04/11/23 1535 Oral     SpO2 04/11/23 1535 96 %     Weight 04/11/23 1528 203 lb 14.8 oz (92.5 kg)     Height --      Head Circumference --      Peak Flow --      Pain Score 04/11/23 1528 0     Pain Loc --      Pain Education --      Exclude from Growth Chart --     Most recent vital signs: Vitals:   04/11/23 2000 04/11/23 2022  BP: 103/64   Pulse: 94   Resp: 20   Temp:    SpO2:  96%     General: Awake, interactive  CV:  Regular rate, good peripheral perfusion.  Resp:  Unlabored respirations, lungs clear to auscultation Abd:  Nondistended, soft, nontender to palpation Neuro:  Alert and oriented, normal extraocular movements, symmetric facial movement, sensation intact over bilateral upper and lower  extremities with 5 out of 5 strength.  Normal finger-to-nose testing.   ED Results / Procedures / Treatments   Labs (all labs ordered are listed, but only abnormal results are displayed) Labs Reviewed  GASTROINTESTINAL PANEL BY PCR, STOOL (REPLACES STOOL CULTURE) - Abnormal; Notable for the following components:      Result Value   Norovirus GI/GII DETECTED (*)    All other components within normal limits  BASIC METABOLIC PANEL - Abnormal; Notable for the following components:   Potassium 5.6 (*)    Glucose, Bld 107 (*)    BUN 24 (*)    Creatinine, Ser 1.44 (*)    GFR, Estimated 59 (*)    All other components within normal limits  CBC - Abnormal; Notable for the following components:   WBC 14.5 (*)    All other components within normal limits  URINALYSIS, ROUTINE W REFLEX MICROSCOPIC - Abnormal; Notable for the following components:   Color, Urine YELLOW (*)    APPearance CLOUDY (*)    Protein, ur 100 (*)    Bacteria, UA RARE (*)    All  other components within normal limits  CBG MONITORING, ED - Abnormal; Notable for the following components:   Glucose-Capillary 117 (*)    All other components within normal limits  HEPATIC FUNCTION PANEL  MAGNESIUM  TROPONIN I (HIGH SENSITIVITY)     EKG EKG independently reviewed interpreted by myself (ER attending) demonstrates:  EKG demonstrates normal sinus rhythm rate of 89, PR 168, QRS 88, QTc 438, no acute ST changes  RADIOLOGY Imaging independently reviewed and interpreted by myself demonstrates:    PROCEDURES:  Critical Care performed: No  Procedures   MEDICATIONS ORDERED IN ED: Medications  ondansetron (ZOFRAN-ODT) disintegrating tablet 4 mg (has no administration in time range)  sodium chloride 0.9 % bolus 1,000 mL (0 mLs Intravenous Stopped 04/11/23 1900)  ondansetron (ZOFRAN) injection 4 mg (4 mg Intravenous Given 04/11/23 1707)  sodium chloride 0.9 % bolus 1,000 mL (0 mLs Intravenous Stopped 04/11/23 2021)      IMPRESSION / MDM / ASSESSMENT AND PLAN / ED COURSE  I reviewed the triage vital signs and the nursing notes.  Differential diagnosis includes, but is not limited to, viral illness, electrolyte abnormality, dehydration, UTI, low suspicion acute intra-abdominal process given absence of abdominal pain and reassuring abdominal exam, consideration for arrhythmia, very low suspicion ACS  Patient's presentation is most consistent with acute illness / injury with system symptoms.  53 year old male presenting to the emergency department following a syncopal episode in the setting of vomiting and diarrhea.  Vital stable on presentation.  Labs with leukocytosis at 14.5.  BMP with AKI with creatinine of 1.44, likely related to dehydration.  Reassuring LFTs and magnesium.  UA without evidence of infection.  Troponin negative.  EKG without acute ischemic findings.  GI panel did return positive for norovirus.  Patient was treated symptomatically with IV fluids and Zofran.  He was able to tolerate p.o. intake following this.  He had a single low blood pressure documented, but repeat blood pressure following this was improved, possibly erroneous but patient was given additional fluids with maintained improved blood pressure.  On relation, patient feels improved.  He is comfortable discharge home and continued outpatient follow-up.  Strict return precautions were provided.  Patient was discharged in stable condition.  Prescription for Zofran sent to patient's pharmacy.    FINAL CLINICAL IMPRESSION(S) / ED DIAGNOSES   Final diagnoses:  Syncope and collapse  Norovirus  Vomiting and diarrhea     Rx / DC Orders   ED Discharge Orders          Ordered    ondansetron (ZOFRAN) 4 MG tablet  Every 6 hours PRN        04/11/23 2052             Note:  This document was prepared using Dragon voice recognition software and may include unintentional dictation errors.   Trinna Post, MD 04/11/23 2052

## 2023-04-11 NOTE — ED Triage Notes (Signed)
Arrives via [facility].  C/O n/v/d since 0200.  While having diarrhea on toilet, patient had syncopal event.    116/84 110 16 Cbg:107  Third episode of syncope in last 30 days  Seen by neuro and Cardiology.  AAOx3.  Skin warm and dry. NAD

## 2023-04-11 NOTE — Discharge Instructions (Signed)
Take your Zofran as needed.  Continue to follow-up as directed regarding your episodes of passing out.  Return to the ER for new or worsening symptoms.

## 2023-04-17 ENCOUNTER — Other Ambulatory Visit: Payer: Self-pay | Admitting: Neurology

## 2023-04-17 DIAGNOSIS — R55 Syncope and collapse: Secondary | ICD-10-CM | POA: Diagnosis not present

## 2023-04-22 ENCOUNTER — Encounter: Payer: Self-pay | Admitting: Neurology

## 2023-04-23 ENCOUNTER — Ambulatory Visit
Admission: RE | Admit: 2023-04-23 | Discharge: 2023-04-23 | Disposition: A | Discharge status: Home / Self Care | Payer: 59 | Source: Ambulatory Visit | Attending: Neurology | Admitting: Neurology

## 2023-04-23 DIAGNOSIS — R55 Syncope and collapse: Secondary | ICD-10-CM | POA: Diagnosis not present

## 2023-06-01 DIAGNOSIS — R55 Syncope and collapse: Secondary | ICD-10-CM | POA: Diagnosis not present

## 2023-06-01 DIAGNOSIS — E782 Mixed hyperlipidemia: Secondary | ICD-10-CM | POA: Diagnosis not present

## 2023-06-01 DIAGNOSIS — E119 Type 2 diabetes mellitus without complications: Secondary | ICD-10-CM | POA: Diagnosis not present

## 2023-06-02 ENCOUNTER — Ambulatory Visit: Payer: 59 | Admitting: Internal Medicine

## 2023-06-02 VITALS — BP 120/68 | HR 85 | Temp 98.0°F | Resp 16 | Ht 69.0 in | Wt 206.0 lb

## 2023-06-02 DIAGNOSIS — R55 Syncope and collapse: Secondary | ICD-10-CM

## 2023-06-02 DIAGNOSIS — D72829 Elevated white blood cell count, unspecified: Secondary | ICD-10-CM | POA: Diagnosis not present

## 2023-06-02 DIAGNOSIS — E875 Hyperkalemia: Secondary | ICD-10-CM | POA: Diagnosis not present

## 2023-06-02 DIAGNOSIS — E7800 Pure hypercholesterolemia, unspecified: Secondary | ICD-10-CM

## 2023-06-02 LAB — CBC WITH DIFFERENTIAL/PLATELET
Basophils Absolute: 0 10*3/uL (ref 0.0–0.1)
Basophils Relative: 0.4 % (ref 0.0–3.0)
Eosinophils Absolute: 0 10*3/uL (ref 0.0–0.7)
Eosinophils Relative: 0.5 % (ref 0.0–5.0)
HCT: 46.3 % (ref 39.0–52.0)
Hemoglobin: 15.7 g/dL (ref 13.0–17.0)
Lymphocytes Relative: 35.1 % (ref 12.0–46.0)
Lymphs Abs: 2.5 10*3/uL (ref 0.7–4.0)
MCHC: 34 g/dL (ref 30.0–36.0)
MCV: 85.1 fl (ref 78.0–100.0)
Monocytes Absolute: 0.4 10*3/uL (ref 0.1–1.0)
Monocytes Relative: 5.7 % (ref 3.0–12.0)
Neutro Abs: 4.1 10*3/uL (ref 1.4–7.7)
Neutrophils Relative %: 58.3 % (ref 43.0–77.0)
Platelets: 297 10*3/uL (ref 150.0–400.0)
RBC: 5.44 Mil/uL (ref 4.22–5.81)
RDW: 14.4 % (ref 11.5–15.5)
WBC: 7 10*3/uL (ref 4.0–10.5)

## 2023-06-02 LAB — BASIC METABOLIC PANEL WITH GFR
BUN: 15 mg/dL (ref 6–23)
CO2: 26 meq/L (ref 19–32)
Calcium: 9.5 mg/dL (ref 8.4–10.5)
Chloride: 102 meq/L (ref 96–112)
Creatinine, Ser: 1.02 mg/dL (ref 0.40–1.50)
GFR: 85.14 mL/min (ref >60.00)
Glucose, Bld: 94 mg/dL (ref 70–99)
Potassium: 4.3 meq/L (ref 3.5–5.1)
Sodium: 139 meq/L (ref 135–145)

## 2023-06-02 NOTE — Progress Notes (Signed)
 Subjective:    Patient ID: Devin Humphrey, male    DOB: [birth year], 53 y.o.   MRN: [id number]  Patient here for  Chief Complaint  Patient presents with   Medical Management of Chronic Issues    HPI Here for a scheduled follow up - follow up regarding syncope, hypertension and hypercholesterolemia. Has seen cardiology and neurology for the syncopal episodes. Cardiology recommended echo and keeping hydrated. Also, recommended compression stockings. ECHO 04/17/23 - normal LV function with EF 55% trivial MR, PR and TR. No significant valve abnormality. MRI brain - no acute intracranial abnormality. Is scheduled for EEG. Reports he is doing well. Feels better. Eating better. He feels symptoms aggravated by diet and being sick. No further episodes. No chest pain or sob reported. Trying to stay active.    Past Medical History:  Diagnosis Date   Acute respiratory failure with hypoxia (HCC)    CONTACT DERMATITIS&OTHER [facility]ZEMA DUE TO PLANTS 08/19/2009   Qualifier: Diagnosis of  By: Harvest Lineman MD, [place]     Degenerative disc disease    cervical disc disease   GERD (gastroesophageal reflux disease)    Past Surgical History:  Procedure Laterality Date   COLONOSCOPY     COLONOSCOPY WITH PROPOFOL N/A 09/10/2021   Procedure: COLONOSCOPY WITH PROPOFOL;  Surgeon: Luke Salaam, MD;  Location: [facility] ENDOSCOPY;  Service: Gastroenterology;  Laterality: N/A;   UPPER GI ENDOSCOPY     Family History  Problem Relation Age of Onset   Hyperlipidemia Father    Heart disease Father        s/p CABG and stent   Stroke Father    Hypertension Father    Colon cancer Neg Hx    Prostate cancer Neg Hx    Social History   Socioeconomic History   Marital status: Married    Spouse name: Kota Ciancio   Number of children: Not on file   Years of education: Not on file   Highest education level: Not on file  Occupational History   Not on file  Tobacco Use   Smoking status: Never   Smokeless tobacco: Never   Vaping Use   Vaping status: Never Used  Substance and Sexual Activity   Alcohol use: Yes    Alcohol/week: 0.0 standard drinks of alcohol    Comment: occasional   Drug use: No   Sexual activity: Yes    Partners: Female  Other Topics Concern   Not on file  Social History Narrative   Not on file   Social Drivers of Health   Financial Resource Strain: Low Risk  (04/01/2023)   Received from [facility]   Overall Financial Resource Strain (CARDIA)    Difficulty of Paying Living Expenses: Not hard at all  Food Insecurity: No Food Insecurity (04/01/2023)   Received from [facility]   Hunger Vital Sign    Worried About Running Out of Food in the Last Year: Never true    Ran Out of Food in the Last Year: Never true  Transportation Needs: No Transportation Needs (04/01/2023)   Received from [facility] - [facility]    In the past 12 months, has lack of transportation kept you from medical appointments or from getting medications?: No    Lack of Transportation (Non-Medical): No  Physical Activity: Insufficiently Active (01/24/2019)   Exercise Vital Sign    Days of Exercise per Week: 3 days    Minutes of Exercise per Session: 30  min  Stress: Stress Concern Present (01/24/2019)   [employer] - Occupational Stress Questionnaire    Feeling of Stress : Very much  Social Connections: Unknown (07/09/2021)   Received from [facility]   Social Network    Social Network: Not on file     Review of Systems  Constitutional:  Negative for appetite change and unexpected weight change.  HENT:  Negative for congestion and sinus pressure.   Respiratory:  Negative for cough, chest tightness and shortness of breath.   Cardiovascular:  Negative for chest pain, palpitations and leg swelling.  Gastrointestinal:  Negative for abdominal pain, diarrhea, nausea and vomiting.  Genitourinary:   Negative for difficulty urinating and dysuria.  Musculoskeletal:  Negative for joint swelling and myalgias.  Skin:  Negative for color change and rash.  Neurological:  Negative for dizziness and headaches.  Psychiatric/Behavioral:  Negative for agitation and dysphoric mood.        Objective:     BP 120/68   Pulse 85   Temp 98 F (36.7 C)   Resp 16   Ht 5\' 9"  (1.753 m)   Wt 206 lb (93.4 kg)   SpO2 99%   BMI 30.42 kg/m  Wt Readings from Last 3 Encounters:  06/02/23 206 lb (93.4 kg)  04/11/23 203 lb 14.8 oz (92.5 kg)  03/27/23 204 lb (92.5 kg)    Physical Exam Constitutional:      General: He is not in acute distress.    Appearance: Normal appearance. He is well-developed.  HENT:     Head: Normocephalic and atraumatic.     Right Ear: External ear normal.     Left Ear: External ear normal.     Mouth/Throat:     Pharynx: No oropharyngeal exudate or posterior oropharyngeal erythema.  Eyes:     General: No scleral icterus.       Right eye: No discharge.        Left eye: No discharge.  Cardiovascular:     Rate and Rhythm: Normal rate and regular rhythm.  Pulmonary:     Effort: Pulmonary effort is normal. No respiratory distress.     Breath sounds: Normal breath sounds.  Abdominal:     General: Bowel sounds are normal.     Palpations: Abdomen is soft.     Tenderness: There is no abdominal tenderness.  Musculoskeletal:        General: No swelling or tenderness.     Cervical back: Neck supple. No tenderness.  Lymphadenopathy:     Cervical: No cervical adenopathy.  Skin:    Findings: No erythema or rash.  Neurological:     Mental Status: He is alert.  Psychiatric:        Mood and Affect: Mood normal.        Behavior: Behavior normal.         Outpatient Encounter Medications as of 06/02/2023  Medication Sig   cholecalciferol (VITAMIN D3) 25 MCG (1000 UNIT) tablet Take 1,000 Units by mouth daily.   aspirin EC 81 MG tablet Take 81 mg by mouth daily. Swallow  whole.   rosuvastatin (CRESTOR) 20 MG tablet TAKE ONE TABLET (20 MG TOTAL) BY MOUTH DAILY.   [DISCONTINUED] ipratropium (ATROVENT) 0.06 % nasal spray Place into the nose.   [DISCONTINUED] predniSONE (DELTASONE) 10 MG tablet Take 40 mg by mouth on day 1, then taper 10 mg daily until gone   [DISCONTINUED] sertraline (ZOLOFT) 50 MG tablet Take 1 tablet (50  mg total) by mouth daily.   No facility-administered encounter medications on file as of 06/02/2023.     Lab Results  Component Value Date   WBC 7.0 06/02/2023   HGB 15.7 06/02/2023   HCT 46.3 06/02/2023   PLT 297.0 06/02/2023   GLUCOSE 94 06/02/2023   CHOL 197 03/27/2023   TRIG 154.0 (H) 03/27/2023   HDL 31.40 (L) 03/27/2023   LDLDIRECT 114.0 02/26/2022   LDLCALC 135 (H) 03/27/2023   ALT 24 04/11/2023   AST 24 04/11/2023   NA 139 06/02/2023   K 4.3 06/02/2023   CL 102 06/02/2023   CREATININE 1.02 06/02/2023   BUN 15 06/02/2023   CO2 26 06/02/2023   TSH 1.58 03/27/2023   PSA 1.06 03/27/2023    MR BRAIN WO CONTRAST Result Date: 04/24/2023 CLINICAL DATA:  Provided history: Syncope, unspecified syncope type. EXAM: MRI HEAD WITHOUT CONTRAST TECHNIQUE: Multiplanar, multiecho pulse sequences of the brain and surrounding structures were obtained without intravenous contrast. COMPARISON:  Brain MRI 11/02/2014. FINDINGS: Brain: No age-advanced or lobar predominant cerebral atrophy. Apparent prominence of the retrocerebellar CSF space at midline and eccentric to the left, measuring 2.1 x 6.3 cm (AP x TV). This is unchanged from the prior brain MRI of 11/02/14 and may reflect a mega cisterna magna or an arachnoid cyst. No cortical encephalomalacia is identified. No significant cerebral white matter disease. There is no acute infarct. No evidence of an intracranial mass. No midline shift. Vascular: Maintained flow voids within the proximal large arterial vessels. 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 Skull and upper  cervical spine: No focal recent marrow lesion. Sinuses/Orbits: No mass or acute finding within the imaged orbits. Trace mucosal thickening within the right maxillary sinus. 19 mm mucous retention cyst, and minimal background mucosal thickening, within the left maxillary sinus. IMPRESSION: 1. No evidence of an acute intracranial abnormality. 2. 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 3. 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 4. Otherwise unremarkable non-contrast MRI appearance of the brain. 5. Paranasal sinus disease as described. Electronically Signed   By: Bascom Lily D.O.   On: 04/24/2023 20:36       Assessment & Plan:  Syncope, unspecified syncope type Assessment & Plan: Has seen cardiology and neurology for the syncopal episodes. Cardiology recommended echo and keeping hydrated. Also, recommended compression stockings. ECHO 04/17/23 - normal LV function with EF 55% trivial MR, PR and TR. No significant valve abnormality. MRI brain - no acute intracranial abnormality. Is scheduled for EEG. Reports he is doing well. Feels better. Eating better. He feels symptoms aggravated by diet and being sick. No further episodes. Stay hydrated. Discussed eating regular meals/protein intake. Follow.    Hyperkalemia Assessment & Plan: Noted on recent labs to have elevated potassium and decreased GFR. Recheck today to confirm normalized now that symptoms have improved/resolved.   Orders: -     Basic metabolic panel with GFR  Leukocytosis, unspecified type Assessment & Plan: Found on recent labs to have elevated white blood cell count. Recheck cbc today to confirm stable.   Orders: -     CBC with Differential/Platelet  Hypercholesterolemia Assessment & Plan: Continues on crestor. Low cholesterol diet and exercise. Follow lipid panel.       Dellar Fenton, MD

## 2023-06-03 ENCOUNTER — Encounter: Payer: Self-pay | Admitting: Internal Medicine

## 2023-06-07 ENCOUNTER — Encounter: Payer: Self-pay | Admitting: Internal Medicine

## 2023-06-07 NOTE — Assessment & Plan Note (Signed)
 Noted on recent labs to have elevated potassium and decreased GFR. Recheck today to confirm normalized now that symptoms have improved/resolved.

## 2023-06-07 NOTE — Assessment & Plan Note (Signed)
 Continues on crestor. Low cholesterol diet and exercise. Follow lipid panel.

## 2023-06-07 NOTE — Assessment & Plan Note (Signed)
 Found on recent labs to have elevated white blood cell count. Recheck cbc today to confirm stable.

## 2023-06-07 NOTE — Assessment & Plan Note (Signed)
 Has seen cardiology and neurology for the syncopal episodes. Cardiology recommended echo and keeping hydrated. Also, recommended compression stockings. ECHO 04/17/23 - normal LV function with EF 55% trivial MR, PR and TR. No significant valve abnormality. MRI brain - no acute intracranial abnormality. Is scheduled for EEG. Reports he is doing well. Feels better. Eating better. He feels symptoms aggravated by diet and being sick. No further episodes. Stay hydrated. Discussed eating regular meals/protein intake. Follow.

## 2023-06-24 ENCOUNTER — Ambulatory Visit: Payer: 59 | Admitting: Internal Medicine

## 2023-06-25 ENCOUNTER — Encounter: Payer: Self-pay | Admitting: Internal Medicine

## 2023-06-25 NOTE — Telephone Encounter (Signed)
 Patients wife aware to try saline nasal spray and nasacort. Wife reported that he has virtual urgent care visit scheduled tomorrow. Will keep appt.

## 2023-06-26 ENCOUNTER — Encounter: Payer: Self-pay | Admitting: Family

## 2023-06-26 ENCOUNTER — Telehealth (INDEPENDENT_AMBULATORY_CARE_PROVIDER_SITE_OTHER): Admitting: Family

## 2023-06-26 VITALS — Ht 69.0 in | Wt 206.4 lb

## 2023-06-26 DIAGNOSIS — J0100 Acute maxillary sinusitis, unspecified: Secondary | ICD-10-CM | POA: Diagnosis not present

## 2023-06-26 MED ORDER — DOXYCYCLINE HYCLATE 100 MG PO TABS
100.0000 mg | ORAL_TABLET | Freq: Two times a day (BID) | ORAL | 0 refills | Status: AC
Start: 2023-06-26 — End: 2023-07-03

## 2023-06-26 NOTE — Patient Instructions (Signed)
 Based on duration of symptoms, I suspect viral and/or allergic in etiology.  Please start over-the-counter plain Mucinex  as discussed today.  Please avoid all decongestant especially with the prior history of feeling faint on Mucinex  D.  Plenty of hydration. If symptoms persist or most certainly worsen, please start doxycycline (antibiotic).  Please start probiotic if you are on antibiotic and ensure you complete the entire course

## 2023-06-26 NOTE — Assessment & Plan Note (Signed)
 Based on duration of symptoms, suspect viral versus allergic in etiology.  Start plain Mucinex .  Discussed prior history of feeling faint on Mucinex  D.  Long discussion in regards to avoiding all decongestants.  Encouraged hydration.  Patient will start doxycycline if symptoms persist or worsen.  He understands start probiotics as well.

## 2023-06-26 NOTE — Progress Notes (Signed)
 Virtual Visit via Video Note  I connected with Devin Humphrey on 06/26/23 at  9:30 AM EDT by a video enabled telemedicine application and verified that I am speaking with the correct person using two identifiers. Location patient: home Location provider: work  Persons participating in the virtual visit: patient, provider  I discussed the limitations of evaluation and management by telemedicine and the availability of in person appointments. The patient expressed understanding and agreed to proceed.  HPI: Accompanied by wife  Complains of sinus pressure x 3 days, unchanged.     Endorses clear runny congestion,  fever ( Tmax 101) , ears popping , occassional cough and scratchy throat. He took sudafed , motrin and tylenol  , mucinex  D ( one dose). He has felt faint on mucinex  D in the past.   H/o seasonal allergies from pollen  History of acute respiratory failure d/t COVID, PNA.  Never smoker  No ckd   ROS: See pertinent positives and negatives per HPI.  EXAM:  VITALS per patient if applicable: Ht 5\' 9"  (1.753 m)   Wt 206 lb 6.4 oz (93.6 kg)   BMI 30.48 kg/m  BP Readings from Last 3 Encounters:  06/02/23 120/68  04/11/23 103/64  03/27/23 110/70   Wt Readings from Last 3 Encounters:  06/26/23 206 lb 6.4 oz (93.6 kg)  06/02/23 206 lb (93.4 kg)  04/11/23 203 lb 14.8 oz (92.5 kg)    GENERAL: alert, oriented, appears well and in no acute distress  HEENT: atraumatic, conjunttiva clear, no obvious abnormalities on inspection of external nose and ears  NECK: normal movements of the head and neck  LUNGS: on inspection no signs of respiratory distress, breathing rate appears normal, no obvious gross SOB, gasping or wheezing  CV: no obvious cyanosis  MS: moves all visible extremities without noticeable abnormality  PSYCH/NEURO: pleasant and cooperative, no obvious depression or anxiety, speech and thought processing grossly intact  ASSESSMENT AND PLAN: Acute  maxillary sinusitis, recurrence not specified Assessment & Plan: Based on duration of symptoms, suspect viral versus allergic in etiology.  Start plain Mucinex .  Discussed prior history of feeling faint on Mucinex  D.  Long discussion in regards to avoiding all decongestants.  Encouraged hydration.  Patient will start doxycycline if symptoms persist or worsen.  He understands start probiotics as well.   Orders: -     Doxycycline Hyclate; Take 1 tablet (100 mg total) by mouth 2 (two) times daily for 7 days.  Dispense: 14 tablet; Refill: 0     -we discussed possible serious and likely etiologies, options for evaluation and workup, limitations of telemedicine visit vs in person visit, treatment, treatment risks and precautions. Pt prefers to treat via telemedicine empirically rather then risking or undertaking an in person visit at this moment.    I discussed the assessment and treatment plan with the patient. The patient was provided an opportunity to ask questions and all were answered. The patient agreed with the plan and demonstrated an understanding of the instructions.   The patient was advised to call back or seek an in-person evaluation if the symptoms worsen or if the condition fails to improve as anticipated.  Advised if desired AVS can be mailed or viewed via MyChart if Mychart user.   Bascom Bossier, FNP

## 2023-08-13 DIAGNOSIS — Z1331 Encounter for screening for depression: Secondary | ICD-10-CM | POA: Diagnosis not present

## 2023-08-13 DIAGNOSIS — R55 Syncope and collapse: Secondary | ICD-10-CM | POA: Diagnosis not present

## 2023-09-08 ENCOUNTER — Encounter: Admitting: Internal Medicine

## 2023-09-16 ENCOUNTER — Encounter: Payer: Self-pay | Admitting: Internal Medicine

## 2023-09-16 ENCOUNTER — Ambulatory Visit (INDEPENDENT_AMBULATORY_CARE_PROVIDER_SITE_OTHER): Admitting: Internal Medicine

## 2023-09-16 VITALS — BP 110/74 | HR 77 | Resp 16 | Ht 68.0 in | Wt 212.4 lb

## 2023-09-16 DIAGNOSIS — Z0000 Encounter for general adult medical examination without abnormal findings: Secondary | ICD-10-CM

## 2023-09-16 DIAGNOSIS — F419 Anxiety disorder, unspecified: Secondary | ICD-10-CM

## 2023-09-16 DIAGNOSIS — R55 Syncope and collapse: Secondary | ICD-10-CM

## 2023-09-16 DIAGNOSIS — E7800 Pure hypercholesterolemia, unspecified: Secondary | ICD-10-CM

## 2023-09-16 DIAGNOSIS — E785 Hyperlipidemia, unspecified: Secondary | ICD-10-CM | POA: Diagnosis not present

## 2023-09-16 MED ORDER — BUSPIRONE HCL 5 MG PO TABS: 5.0000 mg | ORAL_TABLET | Freq: Every day | ORAL | 1 refills | Status: DC | PRN

## 2023-09-16 MED ORDER — ROSUVASTATIN CALCIUM 20 MG PO TABS: 20.0000 mg | ORAL_TABLET | Freq: Every day | ORAL | 1 refills | Status: DC

## 2023-09-16 NOTE — Assessment & Plan Note (Signed)
 Physical today 09/16/23.  PSA 02/2023 - 1.06.  Colonoscopy 09/10/21 - normal. Recommended f/u in 10 years.

## 2023-09-16 NOTE — Assessment & Plan Note (Signed)
 Increase stress.  Discussed.  Previously have tried Zoloft  and Wellbutrin .  Felt like it relaxed him too much.  We discussed a trial of BuSpar .  He is agreeable.  Will start 5 mg in the evening as needed.  Call with update.

## 2023-09-16 NOTE — Assessment & Plan Note (Signed)
 Continues on crestor . Low cholesterol diet and exercise. Follow lipid panel.  No changes today.  He want to hold off getting labs drawn today.

## 2023-09-16 NOTE — Progress Notes (Signed)
 Subjective:    Patient ID: Devin Humphrey, male    DOB: Jul 18, 1970, 53 y.o.   MRN: [id number]  Patient here for  Chief Complaint  Patient presents with   Annual Exam    HPI Here for a physical exam. Recently evaluated for syncope. Had f/u with neurology 08/13/23 - Extensive workup including MRI of the brain, prolonged video EEG, and cardiac evaluation were unremarkable. 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 Lifestyle changes discussed as potential contributing factors to syncope. Cardiology recommended echo and keeping hydrated. Also, recommended compression stockings. ECHO 04/17/23 - normal LV function with EF 55% trivial MR, PR and TR. he reports he is doing well.  Feels good.  Staying active.  No syncope or near syncopal episodes.  No chest pain or shortness of breath.  No abdominal pain or bowel change.  Does report increased stress.  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  Like to have something just to take the edge off.  Has tried Zoloft  and Wellbutrin  previously.  He felt these just relaxed him too much.  We discussed the possibility of a trial of BuSpar .   Past Medical History:  Diagnosis Date   Acute respiratory failure with hypoxia (HCC) 2021   from COVID, in ICU. 2021   CONTACT DERMATITIS&OTHER ECZEMA DUE TO PLANTS 08/19/2009   Qualifier: Diagnosis of  By: Desiderio MD, Eugene     Degenerative disc disease    cervical disc disease   GERD (gastroesophageal reflux disease)    Past Surgical History:  Procedure Laterality Date   COLONOSCOPY     COLONOSCOPY WITH PROPOFOL  N/A 09/10/2021   Procedure: COLONOSCOPY WITH PROPOFOL ;  Surgeon: Therisa Bi, MD;  Location: [facility] ENDOSCOPY;  Service: Gastroenterology;  Laterality: N/A;   UPPER GI ENDOSCOPY     Family History  Problem Relation Age of Onset   Hyperlipidemia Father    Heart disease Father        s/p CABG and stent   Stroke Father     Hypertension Father    Colon cancer Neg Hx    Prostate cancer Neg Hx    Social History   Socioeconomic History   Marital status: Married    Spouse name: Darious Rehman   Number of children: Not on file   Years of education: Not on file   Highest education level: Not on file  Occupational History   Not on file  Tobacco Use   Smoking status: Never   Smokeless tobacco: Never  Vaping Use   Vaping status: Never Used  Substance and Sexual Activity   Alcohol use: Yes    Alcohol/week: 0.0 standard drinks of alcohol    Comment: occasional   Drug use: No   Sexual activity: Yes    Partners: Female  Other Topics Concern   Not on file  Social History Narrative   Not on file   Social Drivers of Health   Financial Resource Strain: Low Risk  (04/01/2023)   Received from [facility]   Overall Financial Resource Strain (CARDIA)    Difficulty of Paying Living Expenses: Not hard at all  Food Insecurity: No Food Insecurity (04/01/2023)   Received from [facility]   Hunger Vital Sign    Within the past 12 months, you worried that your food would run out before you got the money to buy more.: Never true    Within  the past 12 months, the food you bought just didn't last and you didn't have money to get more.: Never true  Transportation Needs: No Transportation Needs (04/01/2023)   Received from [facility] - Transportation    In the past 12 months, has lack of transportation kept you from medical appointments or from getting medications?: No    Lack of Transportation (Non-Medical): No  Physical Activity: Insufficiently Active (01/24/2019)   Exercise Vital Sign    Days of Exercise per Week: 3 days    Minutes of Exercise per Session: 30 min  Stress: Stress Concern Present (01/24/2019)   [employer] of Occupational Health - Occupational Stress Questionnaire    Feeling of Stress : Very much  Social Connections: Unknown  (07/09/2021)   Received from [facility]   Social Network    Social Network: Not on file     Review of Systems  Constitutional:  Negative for appetite change and unexpected weight change.  HENT:  Negative for congestion and sinus pressure.   Respiratory:  Negative for cough, chest tightness and shortness of breath.   Cardiovascular:  Negative for chest pain, palpitations and leg swelling.  Gastrointestinal:  Negative for abdominal pain, diarrhea, nausea and vomiting.  Genitourinary:  Negative for difficulty urinating and dysuria.  Musculoskeletal:  Negative for joint swelling and myalgias.  Skin:  Negative for color change and rash.  Neurological:  Negative for dizziness and headaches.  Psychiatric/Behavioral:  Negative for agitation and dysphoric mood.        Increased stress as outlined.        Objective:     BP 110/74   Pulse 77   Resp 16   Ht 5' 8 (1.727 m)   Wt 212 lb 6.4 oz (96.3 kg)   SpO2 98%   BMI 32.30 kg/m  Wt Readings from Last 3 Encounters:  09/16/23 212 lb 6.4 oz (96.3 kg)  06/26/23 206 lb 6.4 oz (93.6 kg)  06/02/23 206 lb (93.4 kg)    Physical Exam Vitals reviewed.  Constitutional:      General: He is not in acute distress.    Appearance: Normal appearance. He is well-developed.  HENT:     Head: Normocephalic and atraumatic.     Right Ear: External ear normal.     Left Ear: External ear normal.     Mouth/Throat:     Pharynx: No oropharyngeal exudate or posterior oropharyngeal erythema.  Eyes:     General: No scleral icterus.       Right eye: No discharge.        Left eye: No discharge.     Conjunctiva/sclera: Conjunctivae normal.  Cardiovascular:     Rate and Rhythm: Normal rate and regular rhythm.  Pulmonary:     Effort: Pulmonary effort is normal. No respiratory distress.     Breath sounds: Normal breath sounds.  Abdominal:     General: Bowel sounds are normal.     Palpations: Abdomen is soft.     Tenderness: There is no abdominal  tenderness.  Musculoskeletal:        General: No swelling or tenderness.     Cervical back: Neck supple. No tenderness.  Lymphadenopathy:     Cervical: No cervical adenopathy.  Skin:    Findings: No erythema or rash.  Neurological:     Mental Status: He is alert.  Psychiatric:        Mood and Affect: Mood normal.  Behavior: Behavior normal.         Outpatient Encounter Medications as of 09/16/2023  Medication Sig   busPIRone  (BUSPAR ) 5 MG tablet Take 1 tablet (5 mg total) by mouth daily as needed.   aspirin EC 81 MG tablet Take 81 mg by mouth daily. Swallow whole.   cholecalciferol (VITAMIN D3) 25 MCG (1000 UNIT) tablet Take 1,000 Units by mouth daily.   rosuvastatin  (CRESTOR ) 20 MG tablet Take 1 tablet (20 mg total) by mouth daily.   [DISCONTINUED] rosuvastatin  (CRESTOR ) 20 MG tablet TAKE ONE TABLET (20 MG TOTAL) BY MOUTH DAILY.   No facility-administered encounter medications on file as of 09/16/2023.     Lab Results  Component Value Date   WBC 7.0 06/02/2023   HGB 15.7 06/02/2023   HCT 46.3 06/02/2023   PLT 297.0 06/02/2023   GLUCOSE 94 06/02/2023   CHOL 197 03/27/2023   TRIG 154.0 (H) 03/27/2023   HDL 31.40 (L) 03/27/2023   LDLDIRECT 114.0 02/26/2022   LDLCALC 135 (H) 03/27/2023   ALT 24 04/11/2023   AST 24 04/11/2023   NA 139 06/02/2023   K 4.3 06/02/2023   CL 102 06/02/2023   CREATININE 1.02 06/02/2023   BUN 15 06/02/2023   CO2 26 06/02/2023   TSH 1.58 03/27/2023   PSA 1.06 03/27/2023    MR BRAIN WO CONTRAST Result Date: 04/24/2023 CLINICAL DATA:  Provided history: Syncope, unspecified syncope type. EXAM: MRI HEAD WITHOUT CONTRAST TECHNIQUE: Multiplanar, multiecho pulse sequences of the brain and surrounding structures were obtained without intravenous contrast. COMPARISON:  Brain MRI 11/02/2014. FINDINGS: Brain: No age-advanced or lobar predominant cerebral atrophy. Apparent prominence of the retrocerebellar CSF space at midline and eccentric to the  left, measuring 2.1 x 6.3 cm (AP x TV). This is unchanged from the prior brain MRI of 11/02/14 and may reflect a mega cisterna magna or an arachnoid cyst. No cortical encephalomalacia is identified. No significant cerebral white matter disease. There is no acute infarct. No evidence of an intracranial mass. No midline shift. Vascular: Maintained flow voids within the proximal large arterial vessels. 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 Skull and upper cervical spine: No focal recent marrow lesion. Sinuses/Orbits: No mass or acute finding within the imaged orbits. Trace mucosal thickening within the right maxillary sinus. 19 mm mucous retention cyst, and minimal background mucosal thickening, within the left maxillary sinus. IMPRESSION: 1. No evidence of an acute intracranial abnormality. 2. 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 3. 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 4. Otherwise unremarkable non-contrast MRI appearance of the brain. 5. Paranasal sinus disease as described. Electronically Signed   By: Rockey Childs D.O.   On: 04/24/2023 20:36       Assessment & Plan:  Health care maintenance Assessment & Plan: Physical today 09/16/23.  PSA 02/2023 - 1.06.  Colonoscopy 09/10/21 - normal. Recommended f/u in 10 years.     Hypercholesterolemia Assessment & Plan: Continues on crestor . Low cholesterol diet and exercise. Follow lipid panel.  No changes today.  He want to hold off getting labs drawn today.  Orders: -     Lipid panel; Future -     Hepatic function panel; Future -     Basic metabolic panel with GFR; Future  Hyperlipidemia, unspecified hyperlipidemia type -     Rosuvastatin  Calcium ; Take 1 tablet (20 mg total) by mouth daily.  Dispense: 90 tablet; Refill: 1  Anxiety Assessment & Plan:  Increase stress.  Discussed.  Previously have tried Zoloft  and Wellbutrin .  Felt  like it relaxed him too much.  We discussed a trial of BuSpar .  He is agreeable.  Will start 5 mg in the evening as needed.  Call with update.   Syncope, unspecified syncope type Assessment & Plan: Recently evaluated for syncope. Had f/u with neurology 08/13/23 - Extensive workup including MRI of the brain, prolonged video EEG, and cardiac evaluation were unremarkable. 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 Lifestyle changes discussed as potential contributing factors to syncope. Cardiology recommended echo and keeping hydrated. Also, recommended compression stockings. ECHO 04/17/23 - normal LV function with EF 55% trivial MR, PR and TR. he reports he is doing well.  Feels good.  Staying active.  No syncope or near syncopal episodes.  Overall doing well.  Followed.   Other orders -     busPIRone  HCl; Take 1 tablet (5 mg total) by mouth daily as needed.  Dispense: 30 tablet; Refill: 1     Allena Hamilton, MD

## 2023-09-16 NOTE — Assessment & Plan Note (Signed)
 Recently evaluated for syncope. Had f/u with neurology 08/13/23 - Extensive workup including MRI of the brain, prolonged video EEG, and cardiac evaluation were unremarkable. 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 Lifestyle changes discussed as potential contributing factors to syncope. Cardiology recommended echo and keeping hydrated. Also, recommended compression stockings. ECHO 04/17/23 - normal LV function with EF 55% trivial MR, PR and TR. he reports he is doing well.  Feels good.  Staying active.  No syncope or near syncopal episodes.  Overall doing well.  Followed.

## 2023-11-10 ENCOUNTER — Other Ambulatory Visit: Payer: Self-pay | Admitting: Internal Medicine

## 2023-12-01 ENCOUNTER — Other Ambulatory Visit: Payer: Self-pay | Admitting: Internal Medicine

## 2023-12-17 ENCOUNTER — Other Ambulatory Visit

## 2023-12-17 ENCOUNTER — Other Ambulatory Visit: Payer: Self-pay | Admitting: Internal Medicine

## 2023-12-18 NOTE — Telephone Encounter (Signed)
 Copied from CRM [id number]. Topic: Clinical - Medication Question >> Dec 18, 2023 11:49 AM Anairis L wrote: Reason for CRM:  Returning Bri VM regarding a medication question.

## 2023-12-21 ENCOUNTER — Ambulatory Visit: Admitting: Internal Medicine

## 2024-01-04 ENCOUNTER — Other Ambulatory Visit: Payer: Self-pay | Admitting: Pharmacist

## 2024-01-04 NOTE — Progress Notes (Signed)
 Pharmacy Quality Measure Review  This patient is appearing on a report for being at risk of failing the Kidney Health Evaluation for Patients with Diabetes measure this calendar year.   No confirmed hx Diabetes diagnosis in Epic or Care Ev.  No further action needed.

## 2024-01-05 ENCOUNTER — Other Ambulatory Visit (INDEPENDENT_AMBULATORY_CARE_PROVIDER_SITE_OTHER)

## 2024-01-05 DIAGNOSIS — E7800 Pure hypercholesterolemia, unspecified: Secondary | ICD-10-CM | POA: Diagnosis not present

## 2024-01-05 LAB — LIPID PANEL
Cholesterol: 117 mg/dL (ref 0–200)
HDL: 35.8 mg/dL — ABNORMAL LOW (ref >39.00)
LDL Cholesterol: 42 mg/dL (ref 0–99)
NonHDL: 80.72
Total CHOL/HDL Ratio: 3
Triglycerides: 192 mg/dL — ABNORMAL HIGH (ref 0.0–149.0)
VLDL: 38.4 mg/dL (ref 0.0–40.0)

## 2024-01-05 LAB — HEPATIC FUNCTION PANEL
ALT: 30 U/L (ref 0–53)
AST: 24 U/L (ref 0–37)
Albumin: 4.7 g/dL (ref 3.5–5.2)
Alkaline Phosphatase: 76 U/L (ref 39–117)
Bilirubin, Direct: 0.1 mg/dL (ref 0.0–0.3)
Total Bilirubin: 0.4 mg/dL (ref 0.2–1.2)
Total Protein: 7 g/dL (ref 6.0–8.3)

## 2024-01-05 LAB — BASIC METABOLIC PANEL WITH GFR
BUN: 17 mg/dL (ref 6–23)
CO2: 29 meq/L (ref 19–32)
Calcium: 9.2 mg/dL (ref 8.4–10.5)
Chloride: 101 meq/L (ref 96–112)
Creatinine, Ser: 0.98 mg/dL (ref 0.40–1.50)
GFR: 88.95 mL/min (ref >60.00)
Glucose, Bld: 91 mg/dL (ref 70–99)
Potassium: 4.6 meq/L (ref 3.5–5.1)
Sodium: 139 meq/L (ref 135–145)

## 2024-01-06 ENCOUNTER — Ambulatory Visit: Payer: Self-pay | Admitting: Internal Medicine

## 2024-01-08 ENCOUNTER — Ambulatory Visit: Admitting: Internal Medicine

## 2024-01-08 ENCOUNTER — Encounter: Payer: Self-pay | Admitting: Internal Medicine

## 2024-01-08 VITALS — BP 116/70 | HR 88 | Temp 97.5°F | Ht 68.0 in | Wt 218.0 lb

## 2024-01-08 DIAGNOSIS — E785 Hyperlipidemia, unspecified: Secondary | ICD-10-CM

## 2024-01-08 DIAGNOSIS — Z1211 Encounter for screening for malignant neoplasm of colon: Secondary | ICD-10-CM | POA: Diagnosis not present

## 2024-01-08 DIAGNOSIS — F419 Anxiety disorder, unspecified: Secondary | ICD-10-CM | POA: Diagnosis not present

## 2024-01-08 DIAGNOSIS — E7800 Pure hypercholesterolemia, unspecified: Secondary | ICD-10-CM

## 2024-01-08 MED ORDER — ROSUVASTATIN CALCIUM 20 MG PO TABS: 20.0000 mg | ORAL_TABLET | Freq: Every day | ORAL | 1 refills | Status: AC

## 2024-01-08 MED ORDER — BUSPIRONE HCL 5 MG PO TABS: ORAL_TABLET | ORAL | 3 refills | Status: AC

## 2024-01-08 NOTE — Progress Notes (Signed)
 Subjective:    Patient ID: Devin Humphrey, male    DOB: 1971/01/13, 53 y.o.   MRN: [id number]  Patient here for  Chief Complaint  Patient presents with   Medical Management of Chronic Issues    HPI Here for a scheduled follow up - previously evaluated for syncope. Had f/u with neurology 08/13/23 - Extensive workup including MRI of the brain, prolonged video EEG, and cardiac evaluation were unremarkable. 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 Lifestyle changes discussed as potential contributing factors to syncope. Cardiology recommended echo and keeping hydrated. Also, recommended compression stockings. ECHO 04/17/23 - normal LV function with EF 55% trivial MR, PR and TR. he reports he is doing well. Last visit, prescribed buspar  to have prn. Has had no further episodes - no syncope or near syncope. No chest pain or sob. No abdominal pain or bowel change.    Past Medical History:  Diagnosis Date   Acute respiratory failure with hypoxia (HCC) 2021   from COVID, in ICU. 2021   Anxiety 02/25/16   CONTACT DERMATITIS&OTHER ECZEMA DUE TO PLANTS 08/19/2009   Qualifier: Diagnosis of  By: Desiderio MD, Eugene     Degenerative disc disease    cervical disc disease   GERD (gastroesophageal reflux disease)    Sleep apnea 02/25/19   Past Surgical History:  Procedure Laterality Date   COLONOSCOPY     COLONOSCOPY WITH PROPOFOL  N/A 09/10/2021   Procedure: COLONOSCOPY WITH PROPOFOL ;  Surgeon: Therisa Bi, MD;  Location: [facility] ENDOSCOPY;  Service: Gastroenterology;  Laterality: N/A;   EYE SURGERY  07/25/21   Lasik   UPPER GI ENDOSCOPY     Family History  Problem Relation Age of Onset   Hyperlipidemia Father    Heart disease Father        s/p CABG and stent   Stroke Father    Hypertension Father    Colon cancer Neg Hx    Prostate cancer Neg Hx    Social History   Socioeconomic History   Marital status: Married    Spouse  name: Akul Leggette   Number of children: Not on file   Years of education: Not on file   Highest education level: Not on file  Occupational History   Not on file  Tobacco Use   Smoking status: Never   Smokeless tobacco: Never  Vaping Use   Vaping status: Never Used  Substance and Sexual Activity   Alcohol use: Yes    Alcohol/week: 0.0 standard drinks of alcohol    Comment: occasional   Drug use: No   Sexual activity: Yes    Partners: Female  Other Topics Concern   Not on file  Social History Narrative   Not on file   Social Drivers of Health   Financial Resource Strain: Low Risk  (04/01/2023)   Received from [facility]   Overall Financial Resource Strain (CARDIA)    Difficulty of Paying Living Expenses: Not hard at all  Food Insecurity: No Food Insecurity (04/01/2023)   Received from [facility]   Hunger Vital Sign    Within the past 12 months, you worried that your food would run out before you got the money to buy more.: Never true    Within the past 12 months, the food you bought just didn't last and you didn't have money to get more.: Never true  Transportation Needs: No Transportation Needs (04/01/2023)  Received from [facility] - Transportation    In the past 12 months, has lack of transportation kept you from medical appointments or from getting medications?: No    Lack of Transportation (Non-Medical): No  Physical Activity: Insufficiently Active (01/24/2019)   Exercise Vital Sign    Days of Exercise per Week: 3 days    Minutes of Exercise per Session: 30 min  Stress: Stress Concern Present (01/24/2019)   [employer] of Occupational Health - Occupational Stress Questionnaire    Feeling of Stress : Very much  Social Connections: Unknown (07/09/2021)   Received from [facility]   Social Network    Social Network: Not on file     Review of Systems  Constitutional:  Negative for appetite  change and unexpected weight change.  HENT:  Negative for congestion and sinus pressure.   Respiratory:  Negative for cough, chest tightness and shortness of breath.   Cardiovascular:  Negative for chest pain, palpitations and leg swelling.  Gastrointestinal:  Negative for abdominal pain, diarrhea, nausea and vomiting.  Genitourinary:  Negative for difficulty urinating and dysuria.  Musculoskeletal:  Negative for joint swelling and myalgias.  Skin:  Negative for color change and rash.  Neurological:  Negative for dizziness and headaches.  Psychiatric/Behavioral:  Negative for agitation and dysphoric mood.        Objective:     BP 116/70   Pulse 88   Temp (!) 97.5 F (36.4 C) (Oral)   Ht 5' 8 (1.727 m)   Wt 218 lb (98.9 kg)   SpO2 98%   BMI 33.15 kg/m  Wt Readings from Last 3 Encounters:  01/08/24 218 lb (98.9 kg)  09/16/23 212 lb 6.4 oz (96.3 kg)  06/26/23 206 lb 6.4 oz (93.6 kg)    Physical Exam Vitals reviewed.  Constitutional:      General: He is not in acute distress.    Appearance: Normal appearance. He is well-developed.  HENT:     Head: Normocephalic and atraumatic.     Right Ear: External ear normal.     Left Ear: External ear normal.     Mouth/Throat:     Pharynx: No oropharyngeal exudate or posterior oropharyngeal erythema.  Eyes:     General: No scleral icterus.       Right eye: No discharge.        Left eye: No discharge.     Conjunctiva/sclera: Conjunctivae normal.  Cardiovascular:     Rate and Rhythm: Normal rate and regular rhythm.  Pulmonary:     Effort: Pulmonary effort is normal. No respiratory distress.     Breath sounds: Normal breath sounds.  Abdominal:     General: Bowel sounds are normal.     Palpations: Abdomen is soft.     Tenderness: There is no abdominal tenderness.  Musculoskeletal:        General: No swelling or tenderness.     Cervical back: Neck supple. No tenderness.  Lymphadenopathy:     Cervical: No cervical adenopathy.   Skin:    Findings: No erythema or rash.  Neurological:     Mental Status: He is alert.  Psychiatric:        Mood and Affect: Mood normal.        Behavior: Behavior normal.         Outpatient Encounter Medications as of 01/08/2024  Medication Sig   aspirin EC 81 MG tablet Take 81 mg by mouth daily. Swallow whole.  busPIRone  (BUSPAR ) 5 MG tablet Take 2 tablets in am and one midday prn.   cholecalciferol (VITAMIN D3) 25 MCG (1000 UNIT) tablet Take 1,000 Units by mouth daily.   rosuvastatin  (CRESTOR ) 20 MG tablet Take 1 tablet (20 mg total) by mouth daily.   [DISCONTINUED] busPIRone  (BUSPAR ) 5 MG tablet TAKE ONE TABLET (5 MG TOTAL) BY MOUTH DAILY AS NEEDED.   [DISCONTINUED] rosuvastatin  (CRESTOR ) 20 MG tablet Take 1 tablet (20 mg total) by mouth daily.   No facility-administered encounter medications on file as of 01/08/2024.     Lab Results  Component Value Date   WBC 7.0 06/02/2023   HGB 15.7 06/02/2023   HCT 46.3 06/02/2023   PLT 297.0 06/02/2023   GLUCOSE 91 01/05/2024   CHOL 117 01/05/2024   TRIG 192.0 (H) 01/05/2024   HDL 35.80 (L) 01/05/2024   LDLDIRECT 114.0 02/26/2022   LDLCALC 42 01/05/2024   ALT 30 01/05/2024   AST 24 01/05/2024   NA 139 01/05/2024   K 4.6 01/05/2024   CL 101 01/05/2024   CREATININE 0.98 01/05/2024   BUN 17 01/05/2024   CO2 29 01/05/2024   TSH 1.58 03/27/2023   PSA 1.06 03/27/2023    MR BRAIN WO CONTRAST Result Date: 04/24/2023 CLINICAL DATA:  Provided history: Syncope, unspecified syncope type. EXAM: MRI HEAD WITHOUT CONTRAST TECHNIQUE: Multiplanar, multiecho pulse sequences of the brain and surrounding structures were obtained without intravenous contrast. COMPARISON:  Brain MRI 11/02/2014. FINDINGS: Brain: No age-advanced or lobar predominant cerebral atrophy. Apparent prominence of the retrocerebellar CSF space at midline and eccentric to the left, measuring 2.1 x 6.3 cm (AP x TV). This is unchanged from the prior brain MRI of 11/02/14  and may reflect a mega cisterna magna or an arachnoid cyst. No cortical encephalomalacia is identified. No significant cerebral white matter disease. There is no acute infarct. No evidence of an intracranial mass. No midline shift. Vascular: Maintained flow voids within the proximal large arterial vessels. 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 Skull and upper cervical spine: No focal recent marrow lesion. Sinuses/Orbits: No mass or acute finding within the imaged orbits. Trace mucosal thickening within the right maxillary sinus. 19 mm mucous retention cyst, and minimal background mucosal thickening, within the left maxillary sinus. IMPRESSION: 1. No evidence of an acute intracranial abnormality. 2. 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 3. 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 4. Otherwise unremarkable non-contrast MRI appearance of the brain. 5. Paranasal sinus disease as described. Electronically Signed   By: Rockey Childs D.O.   On: 04/24/2023 20:36       Assessment & Plan:  Colon cancer screening Assessment & Plan: Colonoscopy 09/10/21 - normal. Recommended f/u colonoscopy in 10 years.    Hyperlipidemia, unspecified hyperlipidemia type -     Rosuvastatin  Calcium ; Take 1 tablet (20 mg total) by mouth daily.  Dispense: 90 tablet; Refill: 1  Hypercholesterolemia Assessment & Plan: Continues on crestor . Low cholesterol diet and exercise. Check lipid panel.   Orders: -     Lipid panel; Future -     Hepatic function panel; Future -     Basic metabolic panel with GFR; Future -     CBC with Differential/Platelet; Future -     TSH; Future  Anxiety Assessment & Plan: Overall doing well. Has buspar . Follow.    Other orders -     busPIRone  HCl; Take 2 tablets in am and one midday prn.  Dispense: 90 tablet; Refill: 3     Allena Hamilton, MD

## 2024-01-12 ENCOUNTER — Other Ambulatory Visit (HOSPITAL_COMMUNITY): Payer: Self-pay

## 2024-01-12 ENCOUNTER — Telehealth: Payer: Self-pay

## 2024-01-12 NOTE — Telephone Encounter (Signed)
 Pharmacy Patient Advocate Encounter  Insurance verification completed.   The patient is insured through [employer] test claim for Rosuvastatin  Calcium  20MG  tablets. The current 30 day co-pay is, $4.16.  No PA needed at this time.  PLEASE BE ADVISE I CALLED PHARMACY TO REPROCESS   This test claim was processed through [facility]- copay amounts may vary at other pharmacies due to pharmacy/plan contracts, or as the patient moves through the different stages of their insurance plan.

## 2024-01-17 ENCOUNTER — Encounter: Payer: Self-pay | Admitting: Internal Medicine

## 2024-01-17 NOTE — Assessment & Plan Note (Signed)
 Colonoscopy 09/10/21 - normal. Recommended f/u colonoscopy in 10 years.

## 2024-01-17 NOTE — Assessment & Plan Note (Signed)
 Continues on crestor . Low cholesterol diet and exercise. Check lipid panel.

## 2024-01-17 NOTE — Assessment & Plan Note (Signed)
 Overall doing well. Has buspar . Follow.

## 2024-03-28 NOTE — Progress Notes (Signed)
 JAMUS LOVING                                          MRN: [id number]   03/28/2024   The [facility] Quality Team Specialist reviewed this patient medical record for the purposes of chart review for care gap closure. The following were reviewed: chart review for care gap closure-glycemic status assessment.    [facility] Quality Team

## 2024-05-05 ENCOUNTER — Other Ambulatory Visit

## 2024-05-11 ENCOUNTER — Ambulatory Visit: Admitting: Internal Medicine
